# Patient Record
Sex: Female | Born: 1945 | Race: White | Hispanic: No | Smoking: Current every day smoker
Health system: Southern US, Community
[De-identification: ages and names within clinical notes are randomized; demographics above are authoritative.]

## PROBLEM LIST (undated history)

## (undated) DIAGNOSIS — I10 Essential (primary) hypertension: Secondary | ICD-10-CM

## (undated) DIAGNOSIS — D509 Iron deficiency anemia, unspecified: Secondary | ICD-10-CM

## (undated) DIAGNOSIS — R112 Nausea with vomiting, unspecified: Secondary | ICD-10-CM

## (undated) DIAGNOSIS — F410 Panic disorder [episodic paroxysmal anxiety] without agoraphobia: Secondary | ICD-10-CM

## (undated) DIAGNOSIS — F329 Major depressive disorder, single episode, unspecified: Secondary | ICD-10-CM

## (undated) DIAGNOSIS — E039 Hypothyroidism, unspecified: Secondary | ICD-10-CM

## (undated) DIAGNOSIS — E785 Hyperlipidemia, unspecified: Secondary | ICD-10-CM

## (undated) DIAGNOSIS — Q249 Congenital malformation of heart, unspecified: Secondary | ICD-10-CM

## (undated) DIAGNOSIS — F419 Anxiety disorder, unspecified: Secondary | ICD-10-CM

## (undated) DIAGNOSIS — K219 Gastro-esophageal reflux disease without esophagitis: Secondary | ICD-10-CM

## (undated) DIAGNOSIS — C439 Malignant melanoma of skin, unspecified: Secondary | ICD-10-CM

## (undated) DIAGNOSIS — R51 Headache: Secondary | ICD-10-CM

## (undated) DIAGNOSIS — Z9889 Other specified postprocedural states: Secondary | ICD-10-CM

## (undated) DIAGNOSIS — F32A Depression, unspecified: Secondary | ICD-10-CM

## (undated) DIAGNOSIS — K759 Inflammatory liver disease, unspecified: Secondary | ICD-10-CM

## (undated) DIAGNOSIS — I209 Angina pectoris, unspecified: Secondary | ICD-10-CM

## (undated) HISTORY — PX: UPPER GASTROINTESTINAL ENDOSCOPY: SHX188

## (undated) HISTORY — DX: Congenital malformation of heart, unspecified: Q24.9

## (undated) HISTORY — PX: DILATION AND CURETTAGE OF UTERUS: SHX78

## (undated) HISTORY — DX: Hypothyroidism, unspecified: E03.9

## (undated) HISTORY — PX: TUBAL LIGATION: SHX77

## (undated) HISTORY — PX: MELANOMA EXCISION: SHX5266

## (undated) HISTORY — PX: OTHER SURGICAL HISTORY: SHX169

## (undated) HISTORY — DX: Hyperlipidemia, unspecified: E78.5

## (undated) HISTORY — PX: ABDOMINAL HYSTERECTOMY: SHX81

## (undated) HISTORY — PX: COLONOSCOPY: SHX174

## (undated) HISTORY — DX: Malignant melanoma of skin, unspecified: C43.9

## (undated) HISTORY — DX: Anxiety disorder, unspecified: F41.9

## (undated) HISTORY — DX: Iron deficiency anemia, unspecified: D50.9

## (undated) HISTORY — DX: Inflammatory liver disease, unspecified: K75.9

---

## 2000-12-31 ENCOUNTER — Ambulatory Visit (HOSPITAL_COMMUNITY): Admission: RE | Admit: 2000-12-31 | Discharge: 2000-12-31 | Payer: Self-pay | Admitting: Surgery

## 2000-12-31 ENCOUNTER — Encounter: Payer: Self-pay | Admitting: Surgery

## 2001-01-14 ENCOUNTER — Encounter (INDEPENDENT_AMBULATORY_CARE_PROVIDER_SITE_OTHER): Payer: Self-pay | Admitting: *Deleted

## 2001-01-14 ENCOUNTER — Ambulatory Visit (HOSPITAL_BASED_OUTPATIENT_CLINIC_OR_DEPARTMENT_OTHER): Admission: RE | Admit: 2001-01-14 | Discharge: 2001-01-14 | Payer: Self-pay | Admitting: Surgery

## 2005-03-22 ENCOUNTER — Encounter: Admission: RE | Admit: 2005-03-22 | Discharge: 2005-03-22 | Payer: Self-pay | Admitting: Obstetrics and Gynecology

## 2010-06-25 ENCOUNTER — Encounter: Admission: RE | Admit: 2010-06-25 | Discharge: 2010-06-25 | Payer: Self-pay | Admitting: Family Medicine

## 2010-08-25 ENCOUNTER — Encounter: Payer: Self-pay | Admitting: Family Medicine

## 2010-10-15 ENCOUNTER — Emergency Department (HOSPITAL_COMMUNITY)
Admission: EM | Admit: 2010-10-15 | Discharge: 2010-10-15 | Disposition: A | Discharge status: Home / Self Care | Payer: 59 | Attending: Emergency Medicine | Admitting: Emergency Medicine

## 2010-10-15 DIAGNOSIS — R109 Unspecified abdominal pain: Secondary | ICD-10-CM | POA: Insufficient documentation

## 2010-10-15 DIAGNOSIS — R110 Nausea: Secondary | ICD-10-CM | POA: Insufficient documentation

## 2010-10-15 DIAGNOSIS — N898 Other specified noninflammatory disorders of vagina: Secondary | ICD-10-CM | POA: Insufficient documentation

## 2010-10-15 DIAGNOSIS — F329 Major depressive disorder, single episode, unspecified: Secondary | ICD-10-CM | POA: Insufficient documentation

## 2010-10-15 DIAGNOSIS — R55 Syncope and collapse: Secondary | ICD-10-CM | POA: Insufficient documentation

## 2010-10-15 DIAGNOSIS — I10 Essential (primary) hypertension: Secondary | ICD-10-CM | POA: Insufficient documentation

## 2010-10-15 DIAGNOSIS — R42 Dizziness and giddiness: Secondary | ICD-10-CM | POA: Insufficient documentation

## 2010-10-15 DIAGNOSIS — E039 Hypothyroidism, unspecified: Secondary | ICD-10-CM | POA: Insufficient documentation

## 2010-10-15 DIAGNOSIS — N949 Unspecified condition associated with female genital organs and menstrual cycle: Secondary | ICD-10-CM | POA: Insufficient documentation

## 2010-10-15 DIAGNOSIS — R5381 Other malaise: Secondary | ICD-10-CM | POA: Insufficient documentation

## 2010-10-15 DIAGNOSIS — Z79899 Other long term (current) drug therapy: Secondary | ICD-10-CM | POA: Insufficient documentation

## 2010-10-15 DIAGNOSIS — F3289 Other specified depressive episodes: Secondary | ICD-10-CM | POA: Insufficient documentation

## 2010-10-15 LAB — POCT I-STAT, CHEM 8
BUN: 11 mg/dL (ref 6–23)
Calcium, Ion: 1.05 mmol/L — ABNORMAL LOW (ref 1.12–1.32)
Chloride: 103 mEq/L (ref 96–112)
Creatinine, Ser: 1.2 mg/dL (ref 0.4–1.2)
Glucose, Bld: 102 mg/dL — ABNORMAL HIGH (ref 70–99)
HCT: 37 % (ref 36.0–46.0)
Hemoglobin: 12.6 g/dL (ref 12.0–15.0)
Potassium: 3.6 mEq/L (ref 3.5–5.1)
Sodium: 136 mEq/L (ref 135–145)
TCO2: 22 mmol/L (ref 0–100)

## 2010-12-21 NOTE — Op Note (Signed)
[place]  Patient:    Peggy Smith, Peggy Smith                    MRN: [id number] Proc. Date: 01/14/01 Adm. Date:  [id number] Attending:  Katha Cabal CC:         Colon Flattery, D.O.  Ria Bush Jorja Loa, M.D.   Operative Report  CCS# [id number]  PREOPERATIVE DIAGNOSIS:  POSTOPERATIVE DIAGNOSIS:  OPERATION PERFORMED:  SURGEON:  Matthew B. [name], M.D.  ASSISTANT:  ANESTHESIA:  General by LMA.  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  It was felt to be approximately 0.5 mm in thickness although this could not be ensured by the biopsy.  She was seen in the office and discussion was undertaken regarding wide excision versus  wide excision with sentinel lymph node biopsies.  We did preoperative lymphoscintigraphy and she is brought to the operating room at this time for axillary nodal mapping, possible sentinel node biopsy and wide excision.  DESCRIPTION OF PROCEDURE:  Peggy Smith was taken to room 8 at [facility] and after general by LMA was administered, I then performed mapping of the axilla.  The area on her posterior shoulder on the left side had been injected with nuclear medicine.  I elected not to inject this area with Lymphazurin blue.  There were areas very posterior that had counts that were elevated.  I made a small curvilinear incision along the skin crease fairly high in her axilla and went deep.  This area of activity was deep and it was very posterior.  I followed this down into the area of the latissimus dorsi muscle and dissected gently and palpated as I dissected. There was essentially no activity anteriorly along the axillary vein.  This was really kind of pointing posteriorly toward the area of injection. However, as I got down along the muscle and I did not feel anything and I worked this for quite some time, I felt that at this point I  would not blindly probe into this area medial and within the latissimus dorsi muscle and since nothing was palpable, I elected to irrigate and close this incision.  I did not have any neurovascular structures that I transected and no lymphatics were disrupted that I could see.  The wound was closed with 4-0 Vicryl suture with benzoin and Steri-Strips.  The patient was then rolled into the lateral position and I described an ellipse overlying this area which seemed to be overlying an ellipse that would allow for at least 1 cm margin.  This was done and carried out with the knife making it about a 4 cm x 12 cm in greatest dimension ellipse.  This was carried down through the skin, dermis into the fatty tissue and carried down to the fascia.  Specimen was taken off the fascia with the electrocautery.  It was oriented with a long suture on the medial border and a single short suture on the superior border.  The wound was then closed with 2-0 Vicryl subcutaneously and then with a running subcuticular 4-0 Monocryl.  Benzoin and Steri-Strips on the skin.   The patient seemed to tolerate the procedure well and was taken to the recovery room in satisfactory condition.  She will be given Maxidone to take if needed for pain and will be followed up in the office  in seven to 10 days. DD:  01/14/01  TD:  01/14/01 Job: [id number]

## 2011-01-07 ENCOUNTER — Encounter: Payer: Self-pay | Admitting: Cardiovascular Disease

## 2011-01-11 ENCOUNTER — Encounter: Payer: Self-pay | Admitting: Cardiovascular Disease

## 2011-01-14 ENCOUNTER — Encounter: Payer: Self-pay | Admitting: Cardiovascular Disease

## 2011-01-14 ENCOUNTER — Ambulatory Visit (INDEPENDENT_AMBULATORY_CARE_PROVIDER_SITE_OTHER): Payer: 59 | Admitting: Cardiovascular Disease

## 2011-01-14 DIAGNOSIS — R072 Precordial pain: Secondary | ICD-10-CM | POA: Insufficient documentation

## 2011-01-14 DIAGNOSIS — E785 Hyperlipidemia, unspecified: Secondary | ICD-10-CM

## 2011-01-14 DIAGNOSIS — R0600 Dyspnea, unspecified: Secondary | ICD-10-CM | POA: Insufficient documentation

## 2011-01-14 DIAGNOSIS — Z720 Tobacco use: Secondary | ICD-10-CM | POA: Insufficient documentation

## 2011-01-14 DIAGNOSIS — F17200 Nicotine dependence, unspecified, uncomplicated: Secondary | ICD-10-CM

## 2011-01-14 DIAGNOSIS — R0989 Other specified symptoms and signs involving the circulatory and respiratory systems: Secondary | ICD-10-CM

## 2011-01-14 DIAGNOSIS — I10 Essential (primary) hypertension: Secondary | ICD-10-CM

## 2011-01-14 DIAGNOSIS — R079 Chest pain, unspecified: Secondary | ICD-10-CM

## 2011-01-14 DIAGNOSIS — R0609 Other forms of dyspnea: Secondary | ICD-10-CM | POA: Insufficient documentation

## 2011-01-14 NOTE — Assessment & Plan Note (Signed)
Lipomed profile is quite bad.  Statin would be reasonable.  Will f/U with primary.

## 2011-01-14 NOTE — Assessment & Plan Note (Signed)
Normal exam ECG and CXR. Likley related to anxiety.  Will see EF with stress echo

## 2011-01-14 NOTE — Assessment & Plan Note (Signed)
Atypical with normal ECG  Stress echo

## 2011-01-14 NOTE — Progress Notes (Signed)
64 you referred by [facility].  Two weeks ago had URI and sorethroat after returning form [place].  Has anxiety.  Did not feel weel with fatigue, chest pressure and eventually dyspnea.  Had CXR, labs and ECG at [facility].  No abnormalities found.  Records reviewed.  Feels some better now.  CRF;s include HTN, elevated lipids and smoking.  Fairly sedentary but no exertional SSCP.  Denies cough, fever, sore throat is gone.  No history of DVT, PE.  No history of CHF, valve disease or CAD.  Has been very anxious about heart since seeing primary.  I tried to reassure her as it appears that she had URI and anxiety with no objective findings.    Counseled for less than 10 minutes on smoking cessation.  Did not want Chantix.  Offerred Welbutrin and referral to [facility] smoking cessation.  She is willing to try nicorette gum.  Has been on benicar in past but non compliant.  Taking diuretic now.  Does not want to be on long term statin for elevated lipomed profile.    01/09/11 HDL 38 TC 248 LDL PN 2753 LDL 181 HCT 33 with MCV 75.7  CXR NAD ECG NSR 60 normal ECG  ROS: Denies fever, malais, weight loss, blurry vision, decreased visual acuity, cough, sputum, SOB, hemoptysis, pleuritic pain, palpitaitons, heartburn, abdominal pain, melena, lower extremity edema, claudication, or rash.  All other systems reviewed and negative   General: Affect appropriate Healthy:  appears stated age HEENT: normal Neck supple with no adenopathy JVP normal no bruits no thyromegaly Lungs clear with no wheezing and good diaphragmatic motion Heart:  S1/S2 no murmur,rub, gallop or click PMI normal Abdomen: benighn, BS positve, no tenderness, no AAA no bruit.  No HSM or HJR Distal pulses intact with no bruits No edema Neuro non-focal Skin warm and dry No muscular weakness  Medications Current Outpatient Prescriptions  Medication Sig Dispense Refill  . aspirin 81 MG tablet Take 81 mg by mouth daily.        . calcium carbonate (OS-CAL)  600 MG TABS Take 600 mg by mouth 2 (two) times daily with a meal.        . Cholecalciferol (VITAMIN D-3) 5000 UNITS TABS Take by mouth daily.        . clonazePAM (KLONOPIN) 0.5 MG tablet Take 0.5 mg by mouth as directed.        Marland Kitchen esomeprazole (NEXIUM) 40 MG capsule Take 40 mg by mouth daily before breakfast.        . hydrochlorothiazide 25 MG tablet Take 25 mg by mouth daily.        Marland Kitchen l-methylfolate-b2-b6-b12 (CEREFOLIN) 01-03-49-5 MG TABS Take 1 tablet by mouth daily.        Marland Kitchen levothyroxine (SYNTHROID, LEVOTHROID) 50 MCG tablet Take 50 mcg by mouth daily.        . Magnesium 250 MG TABS Take by mouth daily.        . Multiple Vitamin (MULTIVITAMIN) tablet Take 1 tablet by mouth daily.        . NON FORMULARY cholorotrimiton 4mg  qd        . norethindrone (AYGESTIN) 5 MG tablet Take 5 mg by mouth at bedtime.        . vitamin C (ASCORBIC ACID) 500 MG tablet Take 500 mg by mouth daily.          Allergies Review of patient's allergies indicates not on file. Sulfa and Macrodantin  Family History: No family history on file. No  premature CAD  Social History: History   Social History  . Marital Status: Married    Spouse Name: N/A    Number of Children: N/A  . Years of Education: N/A   Occupational History  . Not on file.   Social History Main Topics  . Smoking status: Current Some Day Smoker  . Smokeless tobacco: Not on file  . Alcohol Use: Not on file  . Drug Use: Not on file  . Sexually Active: Not on file   Other Topics Concern  . Not on file   Social History Narrative  . No narrative on file  Married, Sedentary Smokes less than a ppd.  Works as a IT consultant.   Significant anxiety disorder  Electrocardiogram:  NSR 74 Normal ECG  Assessment and Plan

## 2011-01-14 NOTE — Patient Instructions (Signed)
Your physician has requested that you have a stress echocardiogram. For further information please visit [email or web address]. Please follow instruction sheet as given.   

## 2011-01-14 NOTE — Assessment & Plan Note (Signed)
Counseled for less than 10 minutes.  Nicorette gum  F/U primary

## 2011-01-14 NOTE — Assessment & Plan Note (Signed)
Continue diuretic.  Previously on Benicar and told her this is a good medicine to take it if prescribed again.

## 2011-01-28 ENCOUNTER — Ambulatory Visit (HOSPITAL_COMMUNITY): Payer: Medicare PPO | Attending: Cardiovascular Disease

## 2011-01-28 ENCOUNTER — Ambulatory Visit (HOSPITAL_BASED_OUTPATIENT_CLINIC_OR_DEPARTMENT_OTHER): Payer: Medicare PPO | Admitting: Radiology

## 2011-01-28 DIAGNOSIS — I10 Essential (primary) hypertension: Secondary | ICD-10-CM | POA: Insufficient documentation

## 2011-01-28 DIAGNOSIS — R072 Precordial pain: Secondary | ICD-10-CM | POA: Insufficient documentation

## 2011-01-28 DIAGNOSIS — R0989 Other specified symptoms and signs involving the circulatory and respiratory systems: Secondary | ICD-10-CM | POA: Insufficient documentation

## 2011-01-28 DIAGNOSIS — R0609 Other forms of dyspnea: Secondary | ICD-10-CM | POA: Insufficient documentation

## 2011-02-13 ENCOUNTER — Other Ambulatory Visit: Payer: 59

## 2011-02-13 ENCOUNTER — Ambulatory Visit (INDEPENDENT_AMBULATORY_CARE_PROVIDER_SITE_OTHER): Payer: 59 | Admitting: Gastroenterology

## 2011-02-13 ENCOUNTER — Encounter: Payer: Self-pay | Admitting: Gastroenterology

## 2011-02-13 DIAGNOSIS — D509 Iron deficiency anemia, unspecified: Secondary | ICD-10-CM

## 2011-02-13 DIAGNOSIS — K219 Gastro-esophageal reflux disease without esophagitis: Secondary | ICD-10-CM

## 2011-02-13 MED ORDER — PEG-KCL-NACL-NASULF-NA ASC-C 100 G PO SOLR: 1.0000 | Freq: Once | ORAL | Status: DC

## 2011-02-13 NOTE — Patient Instructions (Signed)
Get your labs drawn today in the basement.  You have been scheduled for a Colonoscopy/ Upper Endoscopy with propofol. Separate instructions given. Pick up your prep from the pharmacy.  cc: Leodis Sias, MD

## 2011-02-13 NOTE — Progress Notes (Signed)
History of Present Illness: This is a 65 year old female was found to have an iron deficiency anemia: hemoglobin=11.3, MCV=75.7, ferritin=6, iron=31, B12 & folate normal. She states she restarted menstrual bleeding for the past several months. She has had dark stools since beginning iron.  She was diagnosed with GERD several months ago and placed on Nexium with good control of her symptoms. She relates intermittent tightness in her epigastrium with intermittent loss of appetite. She attributes the symptoms to anxiety. She has not previously undergone colonoscopy or upper endoscopy. She denies weight loss, nausea, vomiting, dysphagia, chest pain, abdominal pain, change in bowel habits, melena, hematochezia.  Past Medical History  Diagnosis Date  . Iron deficiency anemia   . Hyperlipidemia   . Hypothyroidism   . Anxiety   . Cardiac arrhythmia due to congenital heart disease   . Melanoma   . Hepatitis      1976   Past Surgical History  Procedure Date  . Melanoma excision     Shoulder  . Tubal ligation     reports that she has been smoking.  She does not have any smokeless tobacco history on file. She reports that she does not drink alcohol or use illicit drugs. family history includes Brain cancer in her father and Dementia in her mother.  There is no history of Colon cancer. Allergies  Allergen Reactions  . Macrodantin   . Sulfa Antibiotics    Outpatient Encounter Prescriptions as of 02/13/2011  Medication Sig Dispense Refill  . aspirin 81 MG tablet Take 81 mg by mouth daily.        . calcium carbonate (OS-CAL) 600 MG TABS Take 600 mg by mouth 2 (two) times daily with a meal.        . Cholecalciferol (VITAMIN D-3) 5000 UNITS TABS Take by mouth daily.        . clonazePAM (KLONOPIN) 0.5 MG tablet Take 0.25 mg by mouth as directed.       Marland Kitchen esomeprazole (NEXIUM) 40 MG capsule Take 40 mg by mouth daily before breakfast.        . hydrochlorothiazide 25 MG tablet Take 25 mg by mouth daily.         Marland Kitchen l-methylfolate-b2-b6-b12 (CEREFOLIN) 01-03-49-5 MG TABS Take 1 tablet by mouth daily.        Marland Kitchen levothyroxine (SYNTHROID, LEVOTHROID) 50 MCG tablet Take 50 mcg by mouth daily.        . Magnesium 250 MG TABS Take by mouth daily.        . Multiple Vitamin (MULTIVITAMIN) tablet Take 1 tablet by mouth daily.        . NON FORMULARY cholorotrimiton 4mg  qd        . norethindrone (AYGESTIN) 5 MG tablet Take 5 mg by mouth at bedtime.        . vitamin C (ASCORBIC ACID) 500 MG tablet Take 500 mg by mouth daily.        . peg 3350 powder (MOVIPREP) 100 G SOLR Take 1 kit (100 g total) by mouth once.  1 kit  0   Review of Systems: Intermittent headaches, anxiety. Pertinent positive and negative review of systems were noted in the above HPI section. All other review of systems were otherwise negative.  Physical Exam: General: Well developed , well nourished, no acute distress Head: Normocephalic and atraumatic Eyes:  sclerae anicteric, EOMI Ears: Normal auditory acuity Mouth: No deformity or lesions Neck: Supple, no masses or thyromegaly Lungs: Clear throughout to auscultation Heart: Regular  rate and rhythm; no murmurs, rubs or bruits Abdomen: Soft, non tender and non distended. No masses, hepatosplenomegaly or hernias noted. Normal Bowel sounds Rectal: Deferred to colonoscopy Musculoskeletal: Symmetrical with no gross deformities  Skin: No lesions on visible extremities Pulses:  Normal pulses noted Extremities: No clubbing, cyanosis, edema or deformities noted Neurological: Alert oriented x 4, grossly nonfocal Cervical Nodes:  No significant cervical adenopathy Inguinal Nodes: No significant inguinal adenopathy Psychological:  Alert and cooperative. Normal mood and affect. Mildly anxious.  Assessment and Recommendations:  1. Iron deficiency anemia. Possibly related to menstrual losses. Rule out gastrointestinal sources of blood loss such as colorectal neoplasms, AVMs, ulcer disease, Cameron  erosions. Schedule colonoscopy and upper endoscopy. The risks, benefits, and alternatives to colonoscopy with possible biopsy and possible polypectomy were discussed with the patient and they consent to proceed. The risks, benefits, and alternatives to endoscopy with possible biopsy and possible dilation were discussed with the patient and they consent to proceed. Patient states she is quite anxious about undergoing the procedures and she is very concerned about inadequate sedation and having any pain. She takes Klonopin on a daily basis. Plan to proceed with propofol sedation and she is agreeable.  2. GERD. Symptoms well-controlled on Nexium 40 mg daily. Continue Nexium. Begin on standard antireflux measures. Schedule upper endoscopy to evaluate for Barrett's.  3. Anxiety.

## 2011-02-14 LAB — CELIAC PANEL 10
Endomysial Screen: NEGATIVE
IgA: 280 mg/dL (ref 69–380)
Tissue Transglut Ab: 38.7 U/mL — ABNORMAL HIGH (ref <20)

## 2011-03-22 ENCOUNTER — Telehealth: Payer: Self-pay | Admitting: Gastroenterology

## 2011-03-22 ENCOUNTER — Encounter: Payer: Self-pay | Admitting: Gastroenterology

## 2011-03-22 NOTE — Telephone Encounter (Signed)
Spoke with patient and answered questions regarding her medications prior to procedures. Pt states she wait till after the procedure to take her medications just in case.

## 2011-03-22 NOTE — Telephone Encounter (Signed)
Error

## 2011-03-28 DIAGNOSIS — K2270 Barrett's esophagus without dysplasia: Secondary | ICD-10-CM

## 2011-03-29 ENCOUNTER — Ambulatory Visit (AMBULATORY_SURGERY_CENTER): Payer: Medicare PPO | Admitting: Gastroenterology

## 2011-03-29 ENCOUNTER — Encounter: Payer: Self-pay | Admitting: Gastroenterology

## 2011-03-29 VITALS — BP 137/95 | HR 72 | Temp 98.4°F | Resp 16 | Ht 70.0 in | Wt 182.0 lb

## 2011-03-29 DIAGNOSIS — D126 Benign neoplasm of colon, unspecified: Secondary | ICD-10-CM

## 2011-03-29 DIAGNOSIS — K2970 Gastritis, unspecified, without bleeding: Secondary | ICD-10-CM

## 2011-03-29 DIAGNOSIS — D509 Iron deficiency anemia, unspecified: Secondary | ICD-10-CM

## 2011-03-29 DIAGNOSIS — K2940 Chronic atrophic gastritis without bleeding: Secondary | ICD-10-CM

## 2011-03-29 DIAGNOSIS — K2990 Gastroduodenitis, unspecified, without bleeding: Secondary | ICD-10-CM

## 2011-03-29 MED ORDER — SODIUM CHLORIDE 0.9 % IV SOLN: 500.0000 mL | INTRAVENOUS | Status: DC

## 2011-03-29 NOTE — Progress Notes (Signed)
No complaints on discharge.  MAW 

## 2011-03-29 NOTE — Patient Instructions (Signed)
See the picture page for your findings from your exam today.  Follow the green and blue discharge instruction sheets the rest of the day.  Please call if any questions or concerns. Please hold aspirin, aspirin containing products and anti-inflammatory medications for 2 weeks, until 04/12/2011.  You may resume your other prior medications today.

## 2011-04-01 ENCOUNTER — Telehealth: Payer: Self-pay

## 2011-04-01 NOTE — Telephone Encounter (Signed)
Follow up Call- Patient questions:  Do you have a fever, pain , or abdominal swelling? no Pain Score  0 *  Have you tolerated food without any problems? yes  Have you been able to return to your normal activities? yes  Do you have any questions about your discharge instructions: Diet   no Medications  no Follow up visit  no  Do you have questions or concerns about your Care? no  Actions: * If pain score is 4 or above: No action needed, pain <4.  Per the pt "I'm doing okay". MAW

## 2011-04-02 ENCOUNTER — Encounter: Payer: Self-pay | Admitting: Gastroenterology

## 2011-04-26 ENCOUNTER — Other Ambulatory Visit: Payer: Self-pay | Admitting: Obstetrics and Gynecology

## 2011-05-15 NOTE — Patient Instructions (Addendum)
   Your procedure is scheduled on: Thursday, Oct. 18, 2012 at 9:15am  Enter through the Main Entrance of [facility] at:  7:45AM Pick up the phone at the desk and dial [phone] and inform us of your arrival.  Please call this number if you have any problems the morning of surgery: [phone]  Remember: Do not eat food after midnight:  Wednesday Do not drink clear liquids after: Wednesday Take these medicines the morning of surgery with a SIP OF WATER:  PER ANESTHESIA INSTRUCTIONS  Do not wear jewelry, make-up, or FINGER nail polish Do not wear lotions, powders, or perfumes.  You may wear deodorant. Do not shave 48 hours prior to surgery. Do not bring valuables to the hospital.  Patients discharged on the day of surgery will not be allowed to drive home.   Name and phone number of your driver:  Pilar Plate [phone]  Remember to use your hibiclens as instructed.Please shower with 1/2 bottle the evening before your surgery and the other 1/2 bottle the morning of surgery.

## 2011-05-16 NOTE — H&P (Addendum)
NAME:  Peggy Smith, Peggy Smith NO.:  [id number]  MEDICAL RECORD NO.:  [id number]  LOCATION:                                 FACILITY:  PHYSICIAN:  Osborn Coho, M.D.   DATE OF BIRTH:  11/12/1945  DATE OF ADMISSION:  05/23/2011 DATE OF DISCHARGE:                             HISTORY & PHYSICAL   HISTORY OF PRESENT ILLNESS:  Peggy Smith is a 65 year old married white female, para 4-0-0-4 presenting for hysteroscopy, D and C, with resection of an endometrial mass because of postmenopausal bleeding and an endometrial mass.  In September 2010, the patient had hormone pellets placed for menopausal symptoms and reports that since that time, very randomly, she has had bleeding off and on.  At times, the patient's bleeding may only resemble spotting, but then would progress to being "period like" and on occasion very heavy with clotting.  The patient was started on progesterone with some improvement; however, she continued to have intermittent bleeding.  The patient began to notice that whenever she would lift, did a lot of walking or exercise that she would then develop the bleeding once again.  On occasion, there were cramps; however, she did not require any analgesia.  She goes on to say she did not have any post-coital bleeding, changes in her bowel movements, or urinary tract symptoms.  In August 2012, the patient had an endometrial biopsy performed that showed superficial strips of inactive endometrium with benign tubal metaplasia, negative for atypia or malignancy.  A pelvic ultrasound done in September 2012 showed a uterus measuring 12.4 x 6.93 x 7.71 cm with an endometrial mass (questionable fibroid) measuring 2.03 x 1.47 x 1.65 cm.  Additionally, she was observed to have an anterior intramural fibroid measuring 3.78 x 3.18 x 2.87 cm.  Both of the patient's ovaries appeared normal on that study.  The patient had a CBC that showed her hemoglobin and hematocrit to be  14.6 and 42.8 respectively.  Given the patient's menopausal status and the disruptive and protracted nature of her symptoms, she has consented to proceed with hysteroscopy D and C, with possible resection of her endometrial mass.  OB HISTORY:  Gravida 4, para 4-0-0-4.  The patient had a spontaneous vaginal birth in 39, 15, 65, and 47.  GYN HISTORY:  Menarche 65 years old.  The patient has been menopausal since her late 3s.  She denies any abnormal Pap smears or history of sexually transmitted diseases.  Last normal Pap smear was January 2012.  MEDICAL HISTORY: 1. Thyroid disease. 2. Anxiety disorder. 3. Atypical chest pain (negative cardiac workup with [facility]). 4. Gastroesophageal reflux disease. 5. Anemia. 6. Hypertension.  SURGICAL HISTORY:  In 1980, bilateral tubal ligation, 2002 excision of a melanoma from her left shoulder.  The patient denies any history of blood transfusions.  She does have a history of having severe nausea and vomiting with anesthesia.  FAMILY HISTORY:  Positive for hypertension, dementia, and cancer (glioblastoma).  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  HABITS:  She smokes 1/4 pack of cigarettes per day.  Denies any alcohol or illicit  drug use.  CURRENT MEDICATIONS: 1. Levothyroxine 50 mcg. 2. Hydrochlorothiazide 25 mg. 3. Clonazepam 0.5 mg one-half tablet as needed. 4. Aspirin 81 mg daily. 5. Centrum Silver daily. 6. Calcium 600 mg daily. 7. Vitamin B2 100 mg as needed. 8. Magnesium 250 mg as needed. 9. Chlor-Trimeton 4 mg as needed. 10.Nexium 40 mg daily.  ALLERGIES:  MACRODANTIN, which causes her liver to shut down.  Denies any sensitivities to latex, peanuts, soy, or shellfish.  REVIEW OF SYSTEMS:  The patient wears glasses.  She has intermittent headaches that she manages with medication.  She has occasional night sweats, occasional back pain,  occasional palpitations.  Denies any chest pain, shortness of breath, nausea, vomiting, or diarrhea, vision changes, difficulty swallowing, chronic cough, joint swelling, myalgias, (except for back pain) or arthralgias except as is mentioned in history of present illness, the patient's review of systems is otherwise negative.  PHYSICAL EXAMINATION:  VITAL SIGNS:  Blood pressure 140/90, repeated 5 minutes later was 130/88, pulse was 68, respirations 12, temperature 98.3 degrees Fahrenheit orally, weight 178 pounds, height 5 feet 9 inches tall, body mass index 26.3. NECK:  Supple without masses.  There is no thyromegaly or cervical adenopathy. HEART:  Regular rate and rhythm. LUNGS:  Clear. BACK:  No CVA tenderness. ABDOMEN:  No tenderness, masses, or organomegaly. EXTREMITIES:  No clubbing, cyanosis, or edema. PELVIC:  EG/BUS is normal.  Vagina is atrophic.  Cervix is nontender without lesions.  Uterus appears upper limits of normal size without tenderness.  Adnexa without tenderness or masses.  IMPRESSION: 1. Postmenopausal bleeding. 2. Endometrial mass.  DISPOSITION:  A discussion was held with the patient regarding the indications for her procedures along with their risks, which include, but are not limited to reaction to anesthesia, damage to adjacent organs, infection, excessive bleeding, and endometrial scarring.  The patient was given a copy of the ACOG brochure on hysteroscopy and ACOG brochure on dilatation and curettage.  The patient verbalized understanding of her risks and has consented to proceed with hysteroscopy D and C, with possible resection of an endometrial mass at [facility] on May 23, 2011, at 9:15 a.m.     Shenita Trego J. Adline Peals.   ______________________________ Osborn Coho, M.D.    EJP/MEDQ  D:  05/16/2011  T:  05/16/2011  Job:  [id number]  05/23/11 Agree with above no change in H&P - AYR

## 2011-05-17 ENCOUNTER — Encounter (HOSPITAL_COMMUNITY): Payer: Self-pay

## 2011-05-17 ENCOUNTER — Encounter (HOSPITAL_COMMUNITY)
Admission: RE | Admit: 2011-05-17 | Discharge: 2011-05-17 | Disposition: A | Discharge status: Home / Self Care | Payer: Medicare PPO | Source: Ambulatory Visit | Attending: Obstetrics and Gynecology | Admitting: Obstetrics and Gynecology

## 2011-05-17 HISTORY — DX: Other specified postprocedural states: R11.2

## 2011-05-17 HISTORY — DX: Major depressive disorder, single episode, unspecified: F32.9

## 2011-05-17 HISTORY — DX: Headache: R51

## 2011-05-17 HISTORY — DX: Gastro-esophageal reflux disease without esophagitis: K21.9

## 2011-05-17 HISTORY — DX: Depression, unspecified: F32.A

## 2011-05-17 HISTORY — DX: Other specified postprocedural states: Z98.890

## 2011-05-17 HISTORY — DX: Essential (primary) hypertension: I10

## 2011-05-17 HISTORY — DX: Angina pectoris, unspecified: I20.9

## 2011-05-17 LAB — CBC
HCT: 45.1 % (ref 36.0–46.0)
Hemoglobin: 15.5 g/dL — ABNORMAL HIGH (ref 12.0–15.0)
RDW: 13.7 % (ref 11.5–15.5)
WBC: 9.5 10*3/uL (ref 4.0–10.5)

## 2011-05-17 LAB — BASIC METABOLIC PANEL
BUN: 10 mg/dL (ref 6–23)
Chloride: 95 mEq/L — ABNORMAL LOW (ref 96–112)
GFR calc Af Amer: 72 mL/min — ABNORMAL LOW (ref >90)
Glucose, Bld: 100 mg/dL — ABNORMAL HIGH (ref 70–99)
Potassium: 3.6 mEq/L (ref 3.5–5.1)
Sodium: 134 mEq/L — ABNORMAL LOW (ref 135–145)

## 2011-05-17 NOTE — Pre-Procedure Instructions (Signed)
Reviewed Patient's history with Dr Rodman Pickle.  Ok to see patient DOS.  Pt instructed to take morning meds with sip of water DOS.  (A list of morning meds approved to take on DOS hi-lighted and placed on chart). Copy of EKG on chart.

## 2011-05-23 ENCOUNTER — Encounter (HOSPITAL_COMMUNITY)
Admission: RE | Disposition: A | Discharge status: Home / Self Care | Payer: Self-pay | Source: Ambulatory Visit | Attending: Obstetrics and Gynecology

## 2011-05-23 ENCOUNTER — Encounter (HOSPITAL_COMMUNITY): Payer: Self-pay | Admitting: Anesthesiology

## 2011-05-23 ENCOUNTER — Ambulatory Visit (HOSPITAL_COMMUNITY): Payer: Medicare PPO | Admitting: Anesthesiology

## 2011-05-23 ENCOUNTER — Other Ambulatory Visit: Payer: Self-pay | Admitting: Obstetrics and Gynecology

## 2011-05-23 ENCOUNTER — Ambulatory Visit (HOSPITAL_COMMUNITY)
Admission: RE | Admit: 2011-05-23 | Discharge: 2011-05-23 | Disposition: A | Discharge status: Home / Self Care | Payer: Medicare PPO | Source: Ambulatory Visit | Attending: Obstetrics and Gynecology | Admitting: Obstetrics and Gynecology

## 2011-05-23 ENCOUNTER — Encounter (HOSPITAL_COMMUNITY): Payer: Self-pay | Admitting: *Deleted

## 2011-05-23 DIAGNOSIS — N840 Polyp of corpus uteri: Secondary | ICD-10-CM | POA: Insufficient documentation

## 2011-05-23 DIAGNOSIS — N950 Postmenopausal bleeding: Secondary | ICD-10-CM | POA: Insufficient documentation

## 2011-05-23 DIAGNOSIS — D259 Leiomyoma of uterus, unspecified: Secondary | ICD-10-CM | POA: Insufficient documentation

## 2011-05-23 SURGERY — DILATATION & CURETTAGE/HYSTEROSCOPY WITH RESECTOCOPE
Anesthesia: General | Site: Vagina | Wound class: Clean Contaminated

## 2011-05-23 MED ORDER — MIDAZOLAM HCL 2 MG/2ML IJ SOLN
INTRAMUSCULAR | Status: AC
  Filled 2011-05-23: qty 2

## 2011-05-23 MED ORDER — PROPOFOL 10 MG/ML IV EMUL
INTRAVENOUS | Status: DC | PRN
  Administered 2011-05-23: 30 mg via INTRAVENOUS
  Administered 2011-05-23: 120 mg via INTRAVENOUS

## 2011-05-23 MED ORDER — ONDANSETRON HCL 4 MG/2ML IJ SOLN
INTRAMUSCULAR | Status: DC | PRN
  Administered 2011-05-23: 4 mg via INTRAVENOUS

## 2011-05-23 MED ORDER — GLYCINE 1.5 % IR SOLN
Status: DC | PRN
  Administered 2011-05-23: 3000 mL

## 2011-05-23 MED ORDER — EPHEDRINE 5 MG/ML INJ
INTRAVENOUS | Status: AC
  Filled 2011-05-23: qty 10

## 2011-05-23 MED ORDER — LACTATED RINGERS IV SOLN
INTRAVENOUS | Status: DC
  Administered 2011-05-23: 125 mL/h via INTRAVENOUS
  Administered 2011-05-23 (×2): via INTRAVENOUS

## 2011-05-23 MED ORDER — KETOROLAC TROMETHAMINE 60 MG/2ML IM SOLN
INTRAMUSCULAR | Status: AC
  Filled 2011-05-23: qty 2

## 2011-05-23 MED ORDER — EPHEDRINE SULFATE 50 MG/ML IJ SOLN
INTRAMUSCULAR | Status: DC | PRN
  Administered 2011-05-23: 5 mg via INTRAVENOUS

## 2011-05-23 MED ORDER — FENTANYL CITRATE 0.05 MG/ML IJ SOLN
INTRAMUSCULAR | Status: AC
  Filled 2011-05-23: qty 2

## 2011-05-23 MED ORDER — MIDAZOLAM HCL 5 MG/5ML IJ SOLN
INTRAMUSCULAR | Status: DC | PRN
  Administered 2011-05-23: 0.5 mg via INTRAVENOUS

## 2011-05-23 MED ORDER — SCOPOLAMINE 1 MG/3DAYS TD PT72
MEDICATED_PATCH | TRANSDERMAL | Status: AC
  Administered 2011-05-23: 1.5 mg
  Filled 2011-05-23: qty 1

## 2011-05-23 MED ORDER — FENTANYL CITRATE 0.05 MG/ML IJ SOLN: 25.0000 ug | INTRAMUSCULAR | Status: DC | PRN

## 2011-05-23 MED ORDER — PROPOFOL 10 MG/ML IV EMUL
INTRAVENOUS | Status: AC
  Filled 2011-05-23: qty 20

## 2011-05-23 MED ORDER — IBUPROFEN 600 MG PO TABS: 600.0000 mg | ORAL_TABLET | Freq: Four times a day (QID) | ORAL | Status: AC | PRN

## 2011-05-23 MED ORDER — KETOROLAC TROMETHAMINE 30 MG/ML IJ SOLN
INTRAMUSCULAR | Status: DC | PRN
  Administered 2011-05-23: 30 mg via INTRAVENOUS

## 2011-05-23 MED ORDER — HYDROCODONE-ACETAMINOPHEN 5-500 MG PO TABS: 1.0000 | ORAL_TABLET | Freq: Four times a day (QID) | ORAL | Status: AC | PRN

## 2011-05-23 MED ORDER — LIDOCAINE HCL (CARDIAC) 20 MG/ML IV SOLN
INTRAVENOUS | Status: AC
  Filled 2011-05-23: qty 5

## 2011-05-23 MED ORDER — ONDANSETRON HCL 4 MG/2ML IJ SOLN
INTRAMUSCULAR | Status: AC
  Filled 2011-05-23: qty 2

## 2011-05-23 MED ORDER — FENTANYL CITRATE 0.05 MG/ML IJ SOLN
INTRAMUSCULAR | Status: DC | PRN
  Administered 2011-05-23: 50 ug via INTRAVENOUS

## 2011-05-23 MED ORDER — DEXAMETHASONE SODIUM PHOSPHATE 4 MG/ML IJ SOLN
INTRAMUSCULAR | Status: DC | PRN
  Administered 2011-05-23: 10 mg via INTRAVENOUS

## 2011-05-23 MED ORDER — DEXAMETHASONE SODIUM PHOSPHATE 10 MG/ML IJ SOLN
INTRAMUSCULAR | Status: AC
  Filled 2011-05-23: qty 1

## 2011-05-23 MED ORDER — LIDOCAINE HCL 1 % IJ SOLN
INTRAMUSCULAR | Status: DC | PRN
  Administered 2011-05-23: 10 mL

## 2011-05-23 MED ORDER — LIDOCAINE HCL (CARDIAC) 20 MG/ML IV SOLN
INTRAVENOUS | Status: DC | PRN
  Administered 2011-05-23: 50 mg via INTRAVENOUS

## 2011-05-23 MED ORDER — GLYCOPYRROLATE 0.2 MG/ML IJ SOLN
INTRAMUSCULAR | Status: AC
  Filled 2011-05-23: qty 1

## 2011-05-23 SURGICAL SUPPLY — 16 items
CANISTER SUCTION 2500CC (MISCELLANEOUS) ×2 IMPLANT
CATH ROBINSON RED A/P 16FR (CATHETERS) ×2 IMPLANT
CLOTH BEACON ORANGE TIMEOUT ST (SAFETY) ×2 IMPLANT
CONTAINER PREFILL 10% NBF 60ML (FORM) ×4 IMPLANT
DRAPE UTILITY XL STRL (DRAPES) ×4 IMPLANT
ELECT REM PT RETURN 9FT ADLT (ELECTROSURGICAL) ×2
ELECTRODE REM PT RTRN 9FT ADLT (ELECTROSURGICAL) ×1 IMPLANT
GLOVE BIO SURGEON STRL SZ7.5 (GLOVE) ×4 IMPLANT
GLOVE BIOGEL PI IND STRL 7.5 (GLOVE) ×1 IMPLANT
GLOVE BIOGEL PI INDICATOR 7.5 (GLOVE) ×1
GOWN PREVENTION PLUS LG XLONG (DISPOSABLE) ×2 IMPLANT
GOWN STRL REIN XL XLG (GOWN DISPOSABLE) ×2 IMPLANT
LOOP ANGLED CUTTING 22FR (CUTTING LOOP) IMPLANT
PACK HYSTEROSCOPY LF (CUSTOM PROCEDURE TRAY) ×2 IMPLANT
TOWEL OR 17X24 6PK STRL BLUE (TOWEL DISPOSABLE) ×4 IMPLANT
WATER STERILE IRR 1000ML POUR (IV SOLUTION) ×2 IMPLANT

## 2011-05-23 NOTE — Transfer of Care (Signed)
Immediate Anesthesia Transfer of Care Note  Patient: Peggy Smith  Procedure(s) Performed:  DILATATION & CURETTAGE/HYSTEROSCOPY WITH RESECTOSCOPE  Patient Location: PACU  Anesthesia Type: General  Level of Consciousness: awake, alert  and oriented  Airway & Oxygen Therapy: Patient Spontanous Breathing and Patient connected to nasal cannula oxygen  Post-op Assessment: Report given to PACU RN and Post -op Vital signs reviewed and stable  Post vital signs: Reviewed and stable  Complications: No apparent anesthesia complications

## 2011-05-23 NOTE — Op Note (Addendum)
Preop Diagnosis: Post Menopausal Bleeding, Endometrial Mass   Postop Diagnosis: Post Menopausal Bleeding, Endometrial Mass   Procedure: DILATATION & CURETTAGE/HYSTEROSCOPY WITH RESECTOSCOPE   Anesthesia: General   Anesthesiologist: Dana Allan, MD  Attending: Purcell Nails, MD   Assistant: N/a  Findings: approx 2cm posterior wall uterine mass Uterus sounded to 13cm prior to removal of fibroid and 12cm after removal of fibroid  Pathology: 1.endometrial currettings 2.portions of resected fibroid  Fluids: 1400cc Hysteroscopic Deficit 75cc  UOP: QS via straight cath prior to procedure  EBL: Minimal  Complications: None  Procedure: The patient was taken to the operating room after the risks, benefits and alternatives discussed with the patient. The patient verbalized understanding and consent signed and witnessed. The patient was placed under general anesthesia and prepped and draped in the normal sterile fashion and Time Out performed per protocol.  A bivalve speculum was placed in the patient's vagina and the anterior lip of the cervix was grasped with a single tooth tenaculum. A paracervical block was administered using a total of 10 cc of 1% lidocaine. The uterus sounded to 12-13 cm. The cervix was dilated for passage of the hysteroscope.  The hysteroscope was introduced into the uterine cavity and findings as noted above. Sharp curettage was performed until a gritty texture was noted and currettings sent to pathology. The resectoscope was introduced and portion of fibroid resected without difficulty.  The remainder of fibroid was removed with polyp forceps.  The hysteroscope was reintroduced and no obvious remaining intracavitary lesions were noted.  All instruments were removed. Sponge lap and needle count was correct. The patient tolerated the procedure well and was returned to the recovery room in good condition.

## 2011-05-23 NOTE — Anesthesia Procedure Notes (Signed)
Procedure Name: LMA Insertion Date/Time: 05/23/2011 9:35 AM Performed by: Karleen Dolphin Pre-anesthesia Checklist: Patient identified, Patient being monitored, Timeout performed, Emergency Drugs available and Suction available Patient Re-evaluated:Patient Re-evaluated prior to inductionOxygen Delivery Method: Circle System Utilized Preoxygenation: Pre-oxygenation with 100% oxygen LMA: LMA inserted LMA Size: 4.0 Number of attempts: 1 Placement Confirmation: positive ETCO2 and breath sounds checked- equal and bilateral Tube secured with: Tape Dental Injury: Teeth and Oropharynx as per pre-operative assessment

## 2011-05-23 NOTE — Anesthesia Postprocedure Evaluation (Signed)
Anesthesia Post Note  Patient: Peggy Smith  Procedure(s) Performed:  DILATATION & CURETTAGE/HYSTEROSCOPY WITH RESECTOSCOPE  Anesthesia type: General  Patient location: PACU  Post pain: Pain level controlled  Post assessment: Post-op Vital signs reviewed  Last Vitals:  Filed Vitals:   05/23/11 1115  BP: 120/66  Pulse: 73  Temp:   Resp: 16    Post vital signs: Reviewed  Level of consciousness: sedated  Complications: No apparent anesthesia complications

## 2011-05-23 NOTE — Anesthesia Preprocedure Evaluation (Addendum)
Anesthesia Evaluation  Name, MR# and DOB Patient awake  General Assessment Comment  Reviewed: Allergy & Precautions, H&P , NPO status , Patient's Chart, lab work & pertinent test results, reviewed documented beta blocker date and time   History of Anesthesia Complications (+) PONV  Airway Mallampati: I TM Distance: >3 FB Neck ROM: full    Dental  (+) Teeth Intact   Pulmonary (+) shortness of breath (cardiac work-up negative in June) Current Smoker  Normal CXR from 6/12 clear to auscultation  Pulmonary exam normal       Cardiovascular hypertension, On Medications + angina (cardiac work-up negative in June) regular Normal NSR without ischemia on EKG from 6/12 Negative stress echo 6/12   Neuro/Psych  Headaches, PSYCHIATRIC DISORDERS (anxiety, depression)    GI/Hepatic GERD Medicated(+) Hepatitis - (history of hepatitis of unknown origin 35 years ago (entire family had it), no long term effects)  Endo/Other  Hypothyroidism   Renal/GU negative Renal ROS     Musculoskeletal negative musculoskeletal ROS (+)   Abdominal   Peds  Hematology negative hematology ROS (+)   Anesthesia Other Findings   Reproductive/Obstetrics negative OB ROS                          Anesthesia Physical Anesthesia Plan  ASA: III  Anesthesia Plan: General   Post-op Pain Management:    Induction:   Airway Management Planned: LMA  Additional Equipment:   Intra-op Plan:   Post-operative Plan:   Informed Consent: I have reviewed the patients History and Physical, chart, labs and discussed the procedure including the risks, benefits and alternatives for the proposed anesthesia with the patient or authorized representative who has indicated his/her understanding and acceptance.   Dental Advisory Given  Plan Discussed with: CRNA and Surgeon  Anesthesia Plan Comments:        Anesthesia Quick Evaluation

## 2011-10-07 ENCOUNTER — Encounter: Payer: Self-pay | Admitting: Obstetrics and Gynecology

## 2011-10-07 ENCOUNTER — Encounter (INDEPENDENT_AMBULATORY_CARE_PROVIDER_SITE_OTHER): Payer: Medicare PPO | Admitting: Obstetrics and Gynecology

## 2011-10-07 DIAGNOSIS — N950 Postmenopausal bleeding: Secondary | ICD-10-CM

## 2011-10-07 DIAGNOSIS — E559 Vitamin D deficiency, unspecified: Secondary | ICD-10-CM

## 2011-10-07 DIAGNOSIS — Z1329 Encounter for screening for other suspected endocrine disorder: Secondary | ICD-10-CM

## 2011-10-17 ENCOUNTER — Other Ambulatory Visit: Payer: Self-pay | Admitting: Obstetrics and Gynecology

## 2011-10-21 ENCOUNTER — Encounter (HOSPITAL_COMMUNITY): Payer: Self-pay | Admitting: Pharmacist

## 2011-10-30 ENCOUNTER — Encounter (INDEPENDENT_AMBULATORY_CARE_PROVIDER_SITE_OTHER): Payer: Medicare PPO | Admitting: Obstetrics and Gynecology

## 2011-10-30 DIAGNOSIS — N950 Postmenopausal bleeding: Secondary | ICD-10-CM

## 2011-10-30 DIAGNOSIS — D259 Leiomyoma of uterus, unspecified: Secondary | ICD-10-CM

## 2011-10-31 ENCOUNTER — Encounter (HOSPITAL_COMMUNITY): Payer: Self-pay

## 2011-10-31 ENCOUNTER — Encounter (HOSPITAL_COMMUNITY)
Admission: RE | Admit: 2011-10-31 | Discharge: 2011-10-31 | Disposition: A | Discharge status: Home / Self Care | Payer: Medicare PPO | Source: Ambulatory Visit | Attending: Obstetrics and Gynecology | Admitting: Obstetrics and Gynecology

## 2011-10-31 HISTORY — DX: Panic disorder (episodic paroxysmal anxiety): F41.0

## 2011-10-31 LAB — CBC
HCT: 36.6 % (ref 36.0–46.0)
Hemoglobin: 12.1 g/dL (ref 12.0–15.0)
MCV: 85.7 fL (ref 78.0–100.0)
RDW: 13.3 % (ref 11.5–15.5)
WBC: 8.8 10*3/uL (ref 4.0–10.5)

## 2011-10-31 LAB — SURGICAL PCR SCREEN: Staphylococcus aureus: NEGATIVE

## 2011-10-31 LAB — BASIC METABOLIC PANEL
CO2: 29 mEq/L (ref 19–32)
Chloride: 101 mEq/L (ref 96–112)
Creatinine, Ser: 0.98 mg/dL (ref 0.50–1.10)
GFR calc Af Amer: 68 mL/min — ABNORMAL LOW (ref >90)
Potassium: 4.1 mEq/L (ref 3.5–5.1)

## 2011-10-31 NOTE — Patient Instructions (Addendum)
   Your procedure is scheduled on: Friday, April 5th  Enter through the [employer] of [facility] at: 8am Pick up the phone at the desk and dial [phone] and inform us of your arrival.  Please call this number if you have any problems the morning of surgery: [phone]  Remember: Do not eat food after midnight: Thursday Do not drink clear liquids after: midnight Thursday Take these medicines the morning of surgery with a SIP OF WATER:b/p medicine, omeprazole, lexapro, klonopin Do not wear jewelry, make-up, or FINGER nail polish Do not wear lotions, powders, perfumes or deodorant. Do not shave 48 hours prior to surgery. Do not bring valuables to the hospital. Contacts, dentures or bridgework may not be worn into surgery.  Leave suitcase in the car. After Surgery it may be brought to your room. For patients being admitted to the hospital, checkout time is 11:00am the day of discharge.    Remember to use your hibiclens as instructed.Please shower with 1/2 bottle the evening before your surgery and the other 1/2 bottle the morning of surgery. Neck down avoiding private area.

## 2011-11-04 NOTE — H&P (Signed)
NAME:  Peggy Smith, Peggy Smith NO.:  [id number]  MEDICAL RECORD NO.:  [id number]  LOCATION:  [facility]                           FACILITY:  [facility]  PHYSICIAN:  Osborn Coho, M.D.   DATE OF BIRTH:  11-23-1945  DATE OF ADMISSION:  10/16/2011 DATE OF DISCHARGE:                             HISTORY & PHYSICAL   HISTORY OF PRESENT ILLNESS:  Peggy Smith is a 66 year old married white female, para 4-0-0-4 presenting for hysterectomy because of postmenopausal bleeding and uterine fibroids.  The patient has for the past 20 years, been on hormone replacement therapy, with her most recent being subdermal hormone pellets.  The patient discontinued these pellets in September 2010 and since that time she has had intermittent irregular bleeding that at times has been very heavy.  On occasion, the patient may have to change her pad 7 times daily, may soil her clothes, or simply just wear a panty liner.  Over these years she has been treated with Medroxyprogesterone Acetate, Prometrium, and Aygestin all to no avail.  The patient had a pelvic ultrasound in September 2012 that showed a uterus measuring 12.4 x 6.93 x 7.71 cm with an endometrial mass believed to be a fibroid measuring 2.03 x 1.47 x 1.65 cm and an anterior intramural fibroid measuring 3.78 x 3.18 x 2.87 cm.  Both of the patient's ovaries appeared normal on that study.  The patient subsequently underwent hysteroscopy with D & C in October 2012 with pathology showing proliferative endometrium with fragments of submucosal leiomyoma, but no hyperplasia or carcinoma.  After that procedure, the patient continued to have irregular often heavy bleeding. A repeat endometrial biopsy in March 2013 showed benign late secretory endometrium with marked glanular and stromal breakdown consistent with menstrual endometrium.  There was no hyperplasia, atypia, or malignancy identified.  The patient had a CBC,  TSH and vitamin D  all of which were within  normal limits, though her platelet count was 479.  The patient's FSH was non menopausal at 4.1.  The patient denies any pelvic cramping, vaginitis symptoms, flank pain, urinary tract symptoms, or problems with her bowel movements.  The patient was given both medical and surgical management options for her symptoms.  However, due to the protracted and disruptive nature of her symptoms and previous lack of response to therapies she has decided to proceed with definitive therapy in the form of hysterectomy.  OB HISTORY:  Gravida 4, para 4-0-0-4.  The patient had a spontaneous vaginal birth in 63, 63, 47 and 39.  GYN HISTORY:  Menarche at 66 years old.  The patient's menstrual flow has been as previously described.  She uses bilateral tubal ligation as her method of contraception,  Denies any history of abnormal Pap smears or sexually transmitted diseases.  Her last normal Pap smear was in 2013.  PAST MEDICAL HISTORY:  Melanoma (2003), migraines, gastroesophageal reflux disease, thyroid disease, anxiety disorder, atypical chest pain that was deemed noncardiac by [facility] cardiologist, anemia, and hypertension.  SURGICAL HISTORY:  In 1980 bilateral tubal ligation, 2002 excision of left shoulder melanoma, 2012 hysteroscopy D and C with resection of a submucosal fibroid.  She denies any problems with blood transfusions. Admits to  severe nausea and vomiting with anesthesia.  FAMILY HISTORY:  Brain cancer (glioblastoma), emphysema, hypertension, and dementia.  SOCIAL HISTORY:  The patient is married and she works as a Catering manager.  HABITS:  She smokes 3 cigarettes per day.  Denies any alcohol or illicit drug use.  CURRENT MEDICATIONS:  Amlodipine 2.5 mg daily, clonazepam 0.25 mg in the morning and 0.5 mg in the evening, escitalopram 10 mg daily, aspirin 81 mg daily, Centrum, multivitamin daily, calcium 600 mg daily, vitamin B12 100 mcg daily, magnesium 250 mg daily, Chlor-Trimeton 4 mg  daily, levothyroxine 50 mcg daily, vitamin D3 5000 international units daily, and omeprazole 40 mg daily.  ALLERGIES:  The patient is allergic to Macrodantin stating that it shuts down her liver.  She denies any sensitivities to latex, shellfish, soy or peanuts.  REVIEW OF SYSTEMS:  The patient wears glasses.  She denies any headaches, other than with her migraines or any vision changes.  She does on occasion have chest pain, but denies any shortness of breath  (except with panic attacks). She admits to occasional myalgias, but denies any arthralgias, any skin rashes, nausea, vomiting, diarrhea, constipation, and except as is mentioned in history of present illness, the patient's review of systems is otherwise negative.  PHYSICAL EXAMINATION:  VITAL SIGNS:  Blood pressure 100/68, pulse is 78, respirations 16, temperature 97.6 degrees Fahrenheit orally, and weight 165 pounds.  Height 5 feet, 8-1/2 inches tall, body mass index 25. NECK:  Supple without masses.  There is no thyromegaly or cervical adenopathy. HEART:  Regular rate and rhythm. LUNGS:  Clear. BACK:  No CVA tenderness. ABDOMEN:  No tenderness, guarding, rebound or organomegaly. EXTREMITIES:  No clubbing, cyanosis, or edema. PELVIC:  EGBUS is normal.  Vagina is normal.  Cervix is nontender without lesions.  Uterus appears upper limits of normal size, shape, and consistency, is nontender.  Adnexa no tenderness or masses.  IMPRESSION: 1. Post Menopausal Bleeding. 2. Uterine fibroids.  DISPOSITION:  A discussion was held with the patient regarding indications for her procedure along with its risks, which include, but are not limited to reaction to anesthesia, damage to adjacent organs, infection, excessive bleeding, pelvic prolapse and the possible need for an open abdominal incision.  The patient verbalized understanding of these risks and has consented to proceed with a total laparoscopic hysterectomy with bilateral  salpingo-oophorectomy, with the possibility of a laparoscopically assisted vaginal hysterectomy with the possibility of a total abdominal hysterectomy followed by cystoscopy at [facility] [place] on November 08, 2011 at 9:30 a.m.     Ivadell Gaul J. Lowell Guitar, P.A.-C   ______________________________ Osborn Coho, M.D.    EJP/MEDQ  D:  11/03/2011  T:  11/04/2011  Job:  [id number]  Agree with above. H&P Reviewed.

## 2011-11-07 MED ORDER — DEXTROSE 5 % IV SOLN
1.0000 g | INTRAVENOUS | Status: AC
  Administered 2011-11-08: 1 g via INTRAVENOUS
  Filled 2011-11-07: qty 1

## 2011-11-08 ENCOUNTER — Encounter (HOSPITAL_COMMUNITY): Payer: Self-pay | Admitting: Anesthesiology

## 2011-11-08 ENCOUNTER — Inpatient Hospital Stay (HOSPITAL_COMMUNITY): Payer: Medicare PPO | Admitting: Anesthesiology

## 2011-11-08 ENCOUNTER — Observation Stay (HOSPITAL_COMMUNITY)
Admission: AD | Admit: 2011-11-08 | Discharge: 2011-11-09 | Disposition: A | Discharge status: Home / Self Care | Payer: Medicare PPO | Source: Ambulatory Visit | Attending: Obstetrics and Gynecology | Admitting: Obstetrics and Gynecology

## 2011-11-08 ENCOUNTER — Encounter (HOSPITAL_COMMUNITY)
Admission: AD | Disposition: A | Discharge status: Home / Self Care | Payer: Self-pay | Source: Ambulatory Visit | Attending: Obstetrics and Gynecology

## 2011-11-08 DIAGNOSIS — N8000 Endometriosis of the uterus, unspecified: Secondary | ICD-10-CM | POA: Insufficient documentation

## 2011-11-08 DIAGNOSIS — Z01818 Encounter for other preprocedural examination: Secondary | ICD-10-CM | POA: Insufficient documentation

## 2011-11-08 DIAGNOSIS — N950 Postmenopausal bleeding: Secondary | ICD-10-CM

## 2011-11-08 DIAGNOSIS — Z01812 Encounter for preprocedural laboratory examination: Secondary | ICD-10-CM | POA: Insufficient documentation

## 2011-11-08 HISTORY — PX: CYSTOSCOPY: SHX5120

## 2011-11-08 HISTORY — PX: LAPAROSCOPIC HYSTERECTOMY: SHX1926

## 2011-11-08 LAB — CBC
MCH: 27.6 pg (ref 26.0–34.0)
MCHC: 32.4 g/dL (ref 30.0–36.0)
Platelets: 352 10*3/uL (ref 150–400)
RBC: 3.7 MIL/uL — ABNORMAL LOW (ref 3.87–5.11)

## 2011-11-08 SURGERY — HYSTERECTOMY, TOTAL, LAPAROSCOPIC
Anesthesia: General | Wound class: Clean Contaminated

## 2011-11-08 MED ORDER — SCOPOLAMINE 1 MG/3DAYS TD PT72
1.0000 | MEDICATED_PATCH | Freq: Once | TRANSDERMAL | Status: DC | PRN
  Administered 2011-11-08: 1.5 mg via TRANSDERMAL

## 2011-11-08 MED ORDER — PANTOPRAZOLE SODIUM 40 MG PO TBEC
80.0000 mg | DELAYED_RELEASE_TABLET | Freq: Every day | ORAL | Status: DC
  Filled 2011-11-08 (×2): qty 2

## 2011-11-08 MED ORDER — ONDANSETRON HCL 4 MG/2ML IJ SOLN
4.0000 mg | Freq: Four times a day (QID) | INTRAMUSCULAR | Status: DC | PRN
  Administered 2011-11-08: 4 mg via INTRAVENOUS

## 2011-11-08 MED ORDER — HYDROMORPHONE HCL PF 1 MG/ML IJ SOLN
INTRAMUSCULAR | Status: DC | PRN
Start: 2011-11-08 — End: 2011-11-08
  Administered 2011-11-08 (×4): 0.5 mg via INTRAVENOUS

## 2011-11-08 MED ORDER — DIPHENHYDRAMINE HCL 12.5 MG/5ML PO ELIX: 12.5000 mg | ORAL_SOLUTION | Freq: Four times a day (QID) | ORAL | Status: DC | PRN

## 2011-11-08 MED ORDER — ROCURONIUM BROMIDE 100 MG/10ML IV SOLN
INTRAVENOUS | Status: DC | PRN
  Administered 2011-11-08: 50 mg via INTRAVENOUS

## 2011-11-08 MED ORDER — MIDAZOLAM HCL 5 MG/5ML IJ SOLN
INTRAMUSCULAR | Status: DC | PRN
  Administered 2011-11-08 (×3): .25 mg via INTRAVENOUS
  Administered 2011-11-08: 2 mg via INTRAVENOUS
  Administered 2011-11-08: .25 mg via INTRAVENOUS

## 2011-11-08 MED ORDER — ESCITALOPRAM OXALATE 10 MG PO TABS
10.0000 mg | ORAL_TABLET | Freq: Every morning | ORAL | Status: DC
  Administered 2011-11-09: 10 mg via ORAL
  Filled 2011-11-08 (×2): qty 1

## 2011-11-08 MED ORDER — HYDROMORPHONE 0.3 MG/ML IV SOLN
INTRAVENOUS | Status: AC
  Filled 2011-11-08: qty 25

## 2011-11-08 MED ORDER — LEVOTHYROXINE SODIUM 50 MCG PO TABS
50.0000 ug | ORAL_TABLET | Freq: Every day | ORAL | Status: DC
  Administered 2011-11-08: 50 ug via ORAL
  Filled 2011-11-08 (×2): qty 1

## 2011-11-08 MED ORDER — NALOXONE HCL 0.4 MG/ML IJ SOLN: 0.4000 mg | INTRAMUSCULAR | Status: DC | PRN

## 2011-11-08 MED ORDER — GLYCOPYRROLATE 0.2 MG/ML IJ SOLN
INTRAMUSCULAR | Status: DC | PRN
  Administered 2011-11-08: 0.1 mg via INTRAVENOUS
  Administered 2011-11-08: 0.2 mg via INTRAVENOUS

## 2011-11-08 MED ORDER — BUPIVACAINE HCL (PF) 0.25 % IJ SOLN
INTRAMUSCULAR | Status: DC | PRN
  Administered 2011-11-08: 30 mL

## 2011-11-08 MED ORDER — HYDROMORPHONE HCL 2 MG PO TABS: 1.0000 mg | ORAL_TABLET | ORAL | Status: DC | PRN

## 2011-11-08 MED ORDER — ACETAMINOPHEN 325 MG PO TABS: 325.0000 mg | ORAL_TABLET | ORAL | Status: DC | PRN

## 2011-11-08 MED ORDER — IBUPROFEN 600 MG PO TABS
600.0000 mg | ORAL_TABLET | Freq: Four times a day (QID) | ORAL | Status: DC | PRN
  Administered 2011-11-08 – 2011-11-09 (×2): 600 mg via ORAL
  Filled 2011-11-08 (×2): qty 1

## 2011-11-08 MED ORDER — LACTATED RINGERS IV SOLN
INTRAVENOUS | Status: DC
  Administered 2011-11-08 (×4): via INTRAVENOUS

## 2011-11-08 MED ORDER — EPHEDRINE SULFATE 50 MG/ML IJ SOLN
INTRAMUSCULAR | Status: DC | PRN
  Administered 2011-11-08 (×2): 5 mg via INTRAVENOUS
  Administered 2011-11-08: 10 mg via INTRAVENOUS
  Administered 2011-11-08: 15 mg via INTRAVENOUS
  Administered 2011-11-08: 30 mg via INTRAVENOUS
  Administered 2011-11-08 (×2): 10 mg via INTRAVENOUS

## 2011-11-08 MED ORDER — PROMETHAZINE HCL 25 MG/ML IJ SOLN
6.2500 mg | INTRAMUSCULAR | Status: DC | PRN
  Administered 2011-11-08: 6.25 mg via INTRAVENOUS

## 2011-11-08 MED ORDER — DIPHENHYDRAMINE HCL 50 MG/ML IJ SOLN: 12.5000 mg | Freq: Four times a day (QID) | INTRAMUSCULAR | Status: DC | PRN

## 2011-11-08 MED ORDER — KETOROLAC TROMETHAMINE 30 MG/ML IJ SOLN: 15.0000 mg | Freq: Once | INTRAMUSCULAR | Status: DC | PRN

## 2011-11-08 MED ORDER — MEPERIDINE HCL 25 MG/ML IJ SOLN: 6.2500 mg | INTRAMUSCULAR | Status: DC | PRN

## 2011-11-08 MED ORDER — PROMETHAZINE HCL 25 MG/ML IJ SOLN
INTRAMUSCULAR | Status: AC
  Administered 2011-11-08: 6.25 mg via INTRAVENOUS
  Filled 2011-11-08: qty 1

## 2011-11-08 MED ORDER — FENTANYL CITRATE 0.05 MG/ML IJ SOLN: 25.0000 ug | INTRAMUSCULAR | Status: DC | PRN

## 2011-11-08 MED ORDER — MIDAZOLAM HCL 2 MG/2ML IJ SOLN: 0.5000 mg | Freq: Once | INTRAMUSCULAR | Status: DC | PRN

## 2011-11-08 MED ORDER — SODIUM CHLORIDE 0.9 % IJ SOLN: 9.0000 mL | INTRAMUSCULAR | Status: DC | PRN

## 2011-11-08 MED ORDER — ONDANSETRON HCL 4 MG/2ML IJ SOLN: 4.0000 mg | Freq: Four times a day (QID) | INTRAMUSCULAR | Status: DC | PRN

## 2011-11-08 MED ORDER — ONDANSETRON HCL 4 MG/2ML IJ SOLN
INTRAMUSCULAR | Status: AC
  Administered 2011-11-08: 4 mg via INTRAVENOUS
  Filled 2011-11-08: qty 2

## 2011-11-08 MED ORDER — INDIGOTINDISULFONATE SODIUM 8 MG/ML IJ SOLN
INTRAMUSCULAR | Status: AC
  Filled 2011-11-08: qty 5

## 2011-11-08 MED ORDER — SCOPOLAMINE 1 MG/3DAYS TD PT72
MEDICATED_PATCH | TRANSDERMAL | Status: AC
  Administered 2011-11-08: 1.5 mg via TRANSDERMAL
  Filled 2011-11-08: qty 1

## 2011-11-08 MED ORDER — LIDOCAINE HCL (CARDIAC) 20 MG/ML IV SOLN
INTRAVENOUS | Status: DC | PRN
  Administered 2011-11-08: 60 mg via INTRAVENOUS

## 2011-11-08 MED ORDER — BUPIVACAINE HCL (PF) 0.25 % IJ SOLN
INTRAMUSCULAR | Status: AC
  Filled 2011-11-08: qty 30

## 2011-11-08 MED ORDER — AMLODIPINE BESYLATE 2.5 MG PO TABS
2.5000 mg | ORAL_TABLET | Freq: Every morning | ORAL | Status: DC
  Administered 2011-11-09: 2.5 mg via ORAL
  Filled 2011-11-08 (×2): qty 1

## 2011-11-08 MED ORDER — HYDROMORPHONE 0.3 MG/ML IV SOLN
INTRAVENOUS | Status: DC
  Administered 2011-11-08: 17:00:00 via INTRAVENOUS
  Administered 2011-11-08: 0.749 mg via INTRAVENOUS

## 2011-11-08 MED ORDER — CLONAZEPAM 0.5 MG PO TABS
0.5000 mg | ORAL_TABLET | Freq: Every day | ORAL | Status: DC
  Administered 2011-11-08: 0.5 mg via ORAL

## 2011-11-08 MED ORDER — CLONAZEPAM 0.5 MG PO TABS
0.2500 mg | ORAL_TABLET | Freq: Every day | ORAL | Status: DC
  Administered 2011-11-09: 0.25 mg via ORAL
  Filled 2011-11-08 (×2): qty 1

## 2011-11-08 MED ORDER — DEXAMETHASONE SODIUM PHOSPHATE 10 MG/ML IJ SOLN
INTRAMUSCULAR | Status: DC | PRN
  Administered 2011-11-08: 10 mg via INTRAVENOUS

## 2011-11-08 MED ORDER — INDIGOTINDISULFONATE SODIUM 8 MG/ML IJ SOLN
INTRAMUSCULAR | Status: DC | PRN
  Administered 2011-11-08: 40 mg via INTRAVENOUS

## 2011-11-08 MED ORDER — FENTANYL CITRATE 0.05 MG/ML IJ SOLN
INTRAMUSCULAR | Status: DC | PRN
  Administered 2011-11-08: 50 ug via INTRAVENOUS
  Administered 2011-11-08: 100 ug via INTRAVENOUS
  Administered 2011-11-08 (×2): 50 ug via INTRAVENOUS

## 2011-11-08 MED ORDER — ONDANSETRON HCL 4 MG/2ML IJ SOLN
INTRAMUSCULAR | Status: DC | PRN
  Administered 2011-11-08: 4 mg via INTRAVENOUS

## 2011-11-08 MED ORDER — PROPOFOL 10 MG/ML IV EMUL
INTRAVENOUS | Status: DC | PRN
  Administered 2011-11-08: 150 mg via INTRAVENOUS

## 2011-11-08 SURGICAL SUPPLY — 83 items
BARRIER ADHS 3X4 INTERCEED (GAUZE/BANDAGES/DRESSINGS) IMPLANT
CABLE HIGH FREQUENCY MONO STRZ (ELECTRODE) IMPLANT
CANISTER SUCTION 2500CC (MISCELLANEOUS) ×2 IMPLANT
CATH ROBINSON RED A/P 16FR (CATHETERS) IMPLANT
CHLORAPREP W/TINT 26ML (MISCELLANEOUS) ×2 IMPLANT
CLOTH BEACON ORANGE TIMEOUT ST (SAFETY) ×2 IMPLANT
CONT PATH 16OZ SNAP LID 3702 (MISCELLANEOUS) ×2 IMPLANT
COVER MAYO STAND STRL (DRAPES) ×2 IMPLANT
COVER TABLE BACK 60X90 (DRAPES) ×2 IMPLANT
DECANTER SPIKE VIAL GLASS SM (MISCELLANEOUS) IMPLANT
DERMABOND ADVANCED (GAUZE/BANDAGES/DRESSINGS) ×1
DERMABOND ADVANCED .7 DNX12 (GAUZE/BANDAGES/DRESSINGS) ×1 IMPLANT
DISSECTOR BLUNT TIP ENDO 5MM (MISCELLANEOUS) IMPLANT
DISSECTOR SPONGE CHERRY (GAUZE/BANDAGES/DRESSINGS) ×2 IMPLANT
DRAPE HYSTEROSCOPY (DRAPE) IMPLANT
DRAPE PROXIMA HALF (DRAPES) ×2 IMPLANT
ELECT REM PT RETURN 9FT ADLT (ELECTROSURGICAL) ×2
ELECTRODE REM PT RTRN 9FT ADLT (ELECTROSURGICAL) ×1 IMPLANT
EVACUATOR SMOKE 8.L (FILTER) ×4 IMPLANT
FORCEPS CUTTING 33CM 5MM (CUTTING FORCEPS) ×2 IMPLANT
GAUZE PACKING 2X5 YD STERILE (GAUZE/BANDAGES/DRESSINGS) IMPLANT
GAUZE SPONGE 4X4 16PLY XRAY LF (GAUZE/BANDAGES/DRESSINGS) ×2 IMPLANT
GLOVE BIO SURGEON STRL SZ7.5 (GLOVE) ×4 IMPLANT
GLOVE BIOGEL PI IND STRL 7.0 (GLOVE) ×1 IMPLANT
GLOVE BIOGEL PI IND STRL 7.5 (GLOVE) ×2 IMPLANT
GLOVE BIOGEL PI INDICATOR 7.0 (GLOVE) ×1
GLOVE BIOGEL PI INDICATOR 7.5 (GLOVE) ×2
GLOVE INDICATOR 7.0 STRL GRN (GLOVE) ×2 IMPLANT
GOWN PREVENTION PLUS LG XLONG (DISPOSABLE) ×10 IMPLANT
HEMOSTAT SURGICEL 2X14 (HEMOSTASIS) ×2 IMPLANT
HEMOSTAT SURGICEL 4X8 (HEMOSTASIS) IMPLANT
NEEDLE HYPO 25X1 1.5 SAFETY (NEEDLE) IMPLANT
NEEDLE INSUFFLATION 14GA 120MM (NEEDLE) ×2 IMPLANT
NEEDLE MAYO .5 CIRCLE (NEEDLE) IMPLANT
NS IRRIG 1000ML POUR BTL (IV SOLUTION) ×2 IMPLANT
OCCLUDER COLPOPNEUMO (BALLOONS) ×2 IMPLANT
PACK LAPAROSCOPY BASIN (CUSTOM PROCEDURE TRAY) ×2 IMPLANT
PACK LAVH (CUSTOM PROCEDURE TRAY) ×2 IMPLANT
PACK VAGINAL WOMENS (CUSTOM PROCEDURE TRAY) IMPLANT
PAD OB MATERNITY 4.3X12.25 (PERSONAL CARE ITEMS) ×2 IMPLANT
PROTECTOR NERVE ULNAR (MISCELLANEOUS) ×2 IMPLANT
SCALPEL HARMONIC ACE (MISCELLANEOUS) ×2 IMPLANT
SCISSORS LAP 5X35 DISP (ENDOMECHANICALS) IMPLANT
SET CYSTO W/LG BORE CLAMP LF (SET/KITS/TRAYS/PACK) ×2 IMPLANT
SET IRRIG TUBING LAPAROSCOPIC (IRRIGATION / IRRIGATOR) ×2 IMPLANT
SLEEVE ADV FIXATION 5X100MM (TROCAR) ×4 IMPLANT
SOLUTION ELECTROLUBE (MISCELLANEOUS) IMPLANT
SPONGE LAP 18X18 X RAY DECT (DISPOSABLE) ×6 IMPLANT
STAPLER VISISTAT 35W (STAPLE) IMPLANT
STRIP CLOSURE SKIN 1/4X3 (GAUZE/BANDAGES/DRESSINGS) IMPLANT
STRIP CLOSURE SKIN 1/4X4 (GAUZE/BANDAGES/DRESSINGS) IMPLANT
SUT CHROMIC 2 0 CT 1 (SUTURE) ×2 IMPLANT
SUT CHROMIC 2 0 SH (SUTURE) IMPLANT
SUT CHROMIC 2 0 TIES 18 (SUTURE) IMPLANT
SUT MNCRL AB 3-0 PS2 27 (SUTURE) ×6 IMPLANT
SUT MON AB 3-0 SH 27 (SUTURE)
SUT MON AB 3-0 SH27 (SUTURE) IMPLANT
SUT MON AB 4-0 PS1 27 (SUTURE) ×2 IMPLANT
SUT PDS AB 1 CT1 36 (SUTURE) IMPLANT
SUT PDS AB 1 CTX 36 (SUTURE) IMPLANT
SUT PLAIN 2 0 XLH (SUTURE) ×2 IMPLANT
SUT VIC AB 0 CT1 18XCR BRD8 (SUTURE) ×3 IMPLANT
SUT VIC AB 0 CT1 27 (SUTURE) ×4
SUT VIC AB 0 CT1 27XBRD ANBCTR (SUTURE) ×4 IMPLANT
SUT VIC AB 0 CT1 36 (SUTURE) ×2 IMPLANT
SUT VIC AB 0 CT1 8-18 (SUTURE) ×3
SUT VICRYL 0 TIES 12 18 (SUTURE) ×2 IMPLANT
SUT VICRYL 0 UR6 27IN ABS (SUTURE) ×4 IMPLANT
SYR 50ML LL SCALE MARK (SYRINGE) ×2 IMPLANT
SYR CONTROL 10ML LL (SYRINGE) IMPLANT
SYR TB 1ML LUER SLIP (SYRINGE) ×2 IMPLANT
TIP UTERINE 5.1X6CM LAV DISP (MISCELLANEOUS) IMPLANT
TIP UTERINE 6.7X10CM GRN DISP (MISCELLANEOUS) IMPLANT
TIP UTERINE 6.7X6CM WHT DISP (MISCELLANEOUS) IMPLANT
TIP UTERINE 6.7X8CM BLUE DISP (MISCELLANEOUS) ×2 IMPLANT
TOWEL OR 17X24 6PK STRL BLUE (TOWEL DISPOSABLE) ×4 IMPLANT
TRAY FOLEY CATH 14FR (SET/KITS/TRAYS/PACK) ×2 IMPLANT
TROCAR BALLN 12MMX100 BLUNT (TROCAR) IMPLANT
TROCAR Z-THREAD FIOS 11X100 BL (TROCAR) ×4 IMPLANT
TROCAR Z-THREAD FIOS 5X100MM (TROCAR) ×2 IMPLANT
TUBING FILTER THERMOFLATOR (ELECTROSURGICAL) ×2 IMPLANT
WARMER LAPAROSCOPE (MISCELLANEOUS) ×2 IMPLANT
WATER STERILE IRR 1000ML POUR (IV SOLUTION) ×2 IMPLANT

## 2011-11-08 NOTE — Anesthesia Postprocedure Evaluation (Signed)
Anesthesia Post Note  Patient: Peggy Smith  Procedure(s) Performed: Procedure(s) (LRB): HYSTERECTOMY TOTAL LAPAROSCOPIC (N/A) CYSTOSCOPY (N/A)  Anesthesia type: GA  Patient location: PACU  Post pain: Pain level controlled  Post assessment: Post-op Vital signs reviewed  Last Vitals:  Filed Vitals:   11/08/11 0805  BP: 122/74  Pulse: 67  Temp: 36.6 C  Resp: 18    Post vital signs: Reviewed  Level of consciousness: sedated  Complications: No apparent anesthesia complications

## 2011-11-08 NOTE — Transfer of Care (Signed)
Immediate Anesthesia Transfer of Care Note  Patient: Peggy Smith  Procedure(s) Performed: Procedure(s) (LRB): HYSTERECTOMY TOTAL LAPAROSCOPIC (N/A) CYSTOSCOPY (N/A)  Patient Location: PACU  Anesthesia Type: General  Level of Consciousness: sedated and patient cooperative  Airway & Oxygen Therapy: Patient Spontanous Breathing and Patient connected to nasal cannula oxygen  Post-op Assessment: Report given to PACU RN and Post -op Vital signs reviewed and stable  Post vital signs: Reviewed and stable  Complications: No apparent anesthesia complications

## 2011-11-08 NOTE — Progress Notes (Signed)
Day of Surgery Procedure(s) (LRB): 1.HYSTERECTOMY TOTAL LAPAROSCOPIC 2.BSO 3.CYSTOSCOPY   Subjective: Patient reports incisional pain.    Objective: I have reviewed patient's vital signs. UOP 100cc/2hrs   General: alert and no distress Resp: clear to auscultation bilaterally Cardio: regular rate and rhythm GI: soft, decreased bowel sounds, nondistended, app tender, incisions c/d/i Extremities: extremities normal, atraumatic, no cyanosis or edema, Homans sign is negative, no sign of DVT and scds are on Vaginal Bleeding: none  Assessment: s/p Procedure(s) (LRB): HYSTERECTOMY TOTAL LAPAROSCOPIC (N/A) CYSTOSCOPY (N/A): Recovering appropriately.  Plan: Advance diet Encourage ambulation CBC now SCDs Adequate UOP   LOS: 0 days    Kean Gautreau Y 11/08/2011, 5:56 PM

## 2011-11-08 NOTE — Anesthesia Procedure Notes (Signed)
Procedure Name: Intubation Date/Time: 11/08/2011 9:54 AM Performed by: Graciela Husbands Pre-anesthesia Checklist: Suction available, Emergency Drugs available, Timeout performed, Patient identified and Patient being monitored Patient Re-evaluated:Patient Re-evaluated prior to inductionOxygen Delivery Method: Circle system utilized Preoxygenation: Pre-oxygenation with 100% oxygen Intubation Type: IV induction Ventilation: Mask ventilation without difficulty Laryngoscope Size: Mac and 3 Grade View: Grade I Tube type: Oral Tube size: 7.0 mm Number of attempts: 1 Airway Equipment and Method: Stylet Placement Confirmation: ETT inserted through vocal cords under direct vision,  positive ETCO2 and breath sounds checked- equal and bilateral Secured at: 21 cm Tube secured with: Tape (Both front teeth noted to have small notches pre-induction) Dental Injury: Teeth and Oropharynx as per pre-operative assessment

## 2011-11-08 NOTE — Anesthesia Preprocedure Evaluation (Addendum)
Anesthesia Evaluation  Patient identified by MRN, date of birth, ID band Patient awake    Reviewed: Allergy & Precautions, H&P , Patient's Chart, lab work & pertinent test results, reviewed documented beta blocker date and time   History of Anesthesia Complications (+) PONV  Airway Mallampati: II TM Distance: >3 FB Neck ROM: full    Dental No notable dental hx.    Pulmonary neg pulmonary ROS, shortness of breath,  breath sounds clear to auscultation  Pulmonary exam normal       Cardiovascular Exercise Tolerance: Good hypertension, negative cardio ROS  Rhythm:regular Rate:Normal     Neuro/Psych  Headaches, PSYCHIATRIC DISORDERS Anxiety Depression negative neurological ROS  negative psych ROS   GI/Hepatic negative GI ROS, Neg liver ROS, GERD-  ,(+) Hepatitis -  Endo/Other  negative endocrine ROSHypothyroidism   Renal/GU negative Renal ROS     Musculoskeletal   Abdominal   Peds  Hematology negative hematology ROS (+)   Anesthesia Other Findings PONV (postoperative nausea and vomiting)     Iron deficiency anemia        Hypothyroidism     Anxiety        Cardiac arrhythmia due to congenital heart disease     Melanoma        Hepatitis   1976- resolved  Hypertension        GERD (gastroesophageal reflux disease)     Angina   STRESS TEST NORMAL-ANXIETY    Headache   OTC MEDS Depression        Hyperlipidemia   DOES NOT TAKE MEDS - HAS THEM AT HOME BUT STATES DOES NOT TAKE  Panic attacks  Reproductive/Obstetrics negative OB ROS                          Anesthesia Physical Anesthesia Plan  ASA: III  Anesthesia Plan: General ETT   Post-op Pain Management:    Induction:   Airway Management Planned:   Additional Equipment:   Intra-op Plan:   Post-operative Plan:   Informed Consent: I have reviewed the patients History and Physical, chart, labs and discussed the procedure including the  risks, benefits and alternatives for the proposed anesthesia with the patient or authorized representative who has indicated his/her understanding and acceptance.   Dental Advisory Given  Plan Discussed with: CRNA and Surgeon  Anesthesia Plan Comments:         Anesthesia Quick Evaluation

## 2011-11-08 NOTE — Op Note (Signed)
Preop Diagnosis: Post Menopausal Bleeding,Fibriods   Postop Diagnosis: Post Menopausal Bleeding,Fibriods   Procedure: 1.HYSTERECTOMY TOTAL LAPAROSCOPIC 2.BSO 3.CYSTOSCOPY   Anesthesia: General   Anesthesiologist: Velna Hatchet, MD   Attending: Purcell Nails, MD   Assistant:  Jaymes Graff, MD  Findings: Normal bilateral ovaries and tubes.  Uterus sounded to 10 cm.   Pathology: Uterus, cervix, fallopian tubes and ovaries.  Fluids: 2000 cc  UOP: 300 cc  EBL: 175 cc  Complications: None  Procedure: The patient was taken to the operating room, placed under general anesthesia and prepped and draped in the normal sterile fashion. A Foley catheter was placed. The uterus sounded to 10 cm. A weighted speculum and vaginal retractors were placed in the vagina. Tenaculum was placed on the anterior lip of the cervix.  A size 3.5 cm tip was used and the rumi was placed, tip balloon and occluder insufflated. Attention was then turned to the abdomen. A 10 mm infraumbilical incision was made with the scalpel after 5 cc of 25% percent Marcaine was used for local anesthesia. The subcutaneous tissue was dissected and the fascia was incised with the knife. A purse string stitch was placed in the fascia and Hassan placed into the intra-abdominal cavity and anchored to the suture. Intraabdominal placement was confirmed with the laparoscope.  Two 5 mm trochars were placed in the right and left lower quadrants under direct visualization with the laparoscope.  The harmonic scalpel was used to cauterize and cut the uterine ovarian ligaments bilaterally.   Both round ligaments were cauterized and cut with the harmonic scalpel as well and the bladder flap created with the harmonic scalpel and removed away from the uterus. Both uterine arteries were cauterized and cut with harmonic scalpel.  The harmonic was used to circumscribe the [facility] ring and free the uterus and cervix. The uterus was then pulled into  the vagina. The infundibulopelvic ligaments were then cauterized and cut bilaterally after identifying the ureters and the fallopian tubes and ovaries were excised and sent to pathology.  Both angles were sutured with 1 PDS.  The remainder of the cuff was sutured with 1 PDS using interrupted stitches until the vaginal cuff was closed.  A total of 5 sutures were used.   Irrigation was performed.  There was oozing from venous sinuses on the right which were cauterized and surgicel applied to assure hemostasis.  Gas was allowed to leave the abdomen to check for bleeders and all pedicles were seen to be hemostatic and the patient was given indigo carmine.  Cystoscopy was performed and both ureters were seen to efflux indigo carmine without difficulty. The bladder had full integrity with no suture or laceration visualized.  The vagina was inspected and the cuff was noted to be intact.  Attention was then turned back to the abdomen after removing top pair of gloves. The abdomen was reinsufflated with CO2 gas.   The abdomen and pelvis was copiously irrigated and  hemostasis was noted.  The 10 mm port on the patients left side was closed with 0 Vicryl using the fascial closure device.  All trochars were removed under direct visualization using the laparoscope.  The umbilical fascia was reapproximated using 0 vicryl. The two 10 mm incisions were closed with 3-0 Monocryl via a subcuticular stitch.  All remaining skin incisions were closed with Dermabond and the 10 mm skin incisions were reinforced using Dermabond.  Sponge lap and needle counts were correct.  The patient tolerated the procedure well  and was returned to the PACU in stable condition

## 2011-11-09 LAB — CBC
HCT: 26.4 % — ABNORMAL LOW (ref 36.0–46.0)
Platelets: 326 10*3/uL (ref 150–400)
RBC: 3.12 MIL/uL — ABNORMAL LOW (ref 3.87–5.11)
RDW: 13.4 % (ref 11.5–15.5)
WBC: 15.8 10*3/uL — ABNORMAL HIGH (ref 4.0–10.5)

## 2011-11-09 MED ORDER — HYDROMORPHONE HCL 2 MG PO TABS: 1.0000 mg | ORAL_TABLET | Freq: Four times a day (QID) | ORAL | Status: AC | PRN

## 2011-11-09 MED ORDER — IBUPROFEN 600 MG PO TABS: 600.0000 mg | ORAL_TABLET | Freq: Four times a day (QID) | ORAL | Status: AC | PRN

## 2011-11-09 MED ORDER — HYDROMORPHONE HCL 2 MG PO TABS
1.0000 mg | ORAL_TABLET | ORAL | Status: DC | PRN
  Administered 2011-11-09: 1 mg via ORAL
  Filled 2011-11-09: qty 1

## 2011-11-09 NOTE — Addendum Note (Signed)
Addendum  created 11/09/11 1006 by Lincoln Brigham, CRNA   Modules edited:Notes Section

## 2011-11-09 NOTE — Anesthesia Postprocedure Evaluation (Signed)
  Anesthesia Post-op Note  Patient: Peggy Smith  Procedure(s) Performed: Procedure(s) (LRB): HYSTERECTOMY TOTAL LAPAROSCOPIC (N/A) CYSTOSCOPY (N/A)  Patient Location: [facility] Unit  Anesthesia Type: General  Level of Consciousness: awake, alert  and oriented  Airway and Oxygen Therapy: Patient Spontanous Breathing  Post-op Pain: mild  Post-op Assessment: Patient's Cardiovascular Status Stable, Respiratory Function Stable, Patent Airway, No signs of Nausea or vomiting, Adequate PO intake and Pain level controlled  Post-op Vital Signs: stable  Complications: No apparent anesthesia complications

## 2011-11-09 NOTE — Discharge Summary (Signed)
Physician Discharge Summary  Patient ID: Peggy Smith MRN: [id number] DOB/AGE: 67-Dec-1947 67 y.o.  Admit date: 11/08/2011 Discharge date: 11/09/2011  Admission Diagnoses: PMB  Discharge Diagnoses: PMB  Active Problems:  * No active hospital problems. *    Discharged Condition: good  Hospital Course: Pt is s/p TLH/BSO/Cystoscopy.  She had an uneventful postop course and is ready for discharge on postop day 1 with no complaints.  Ambulating without difficulty and denies lightheadedness or dizziness.  Pt has not taken narcotic pain med because she doesn't want to feel woozy.  She has moderate pain and I discussed pain medicine as needed.  Consults: None  Significant Diagnostic Studies: n/a  Treatments: surgery: TLH/BSO/Cystopscopy and routine post op care.  Discharge Exam: Blood pressure 127/74, pulse 84, temperature 98.9 F (37.2 C), temperature source Oral, resp. rate 18, SpO2 100.00%. General appearance: alert and no distress Resp: clear to auscultation bilaterally Cardio: regular rate and rhythm GI: soft, app tender, ND, NABS, incisions c/d/i Extremities: extremities normal, atraumatic, no cyanosis or edema, Homans sign is negative, no sign of DVT and no edema, redness or tenderness in the calves or thighs  Disposition: 01-Home or Self Care  Discharge Orders    Future Appointments: Provider: Department: Dept Phone: Center:   12/20/2011 2:00 PM Purcell Nails, MD [facility] [phone] None     Medication List  As of 11/09/2011 10:12 AM   TAKE these medications         amLODipine 2.5 MG tablet   Commonly known as: NORVASC   Take 2.5 mg by mouth every morning.      aspirin 81 MG tablet   Take 81 mg by mouth every morning.      calcium carbonate 600 MG Tabs   Commonly known as: OS-CAL   Take 600 mg by mouth 2 (two) times daily with a meal.      CENTRUM SILVER PO   Take 1 tablet by mouth every morning.      clonazePAM 0.5 MG tablet   Commonly known as:  KLONOPIN   Take 0.25-0.5 mg by mouth 2 (two) times daily. Take 0.25mg  in morning and 0.5mg  at night      escitalopram 10 MG tablet   Commonly known as: LEXAPRO   Take 10 mg by mouth every morning.      HYDROmorphone 2 MG tablet   Commonly known as: DILAUDID   Take 0.5 tablets (1 mg total) by mouth every 6 (six) hours as needed for pain.      ibuprofen 600 MG tablet   Commonly known as: ADVIL,MOTRIN   Take 1 tablet (600 mg total) by mouth every 6 (six) hours as needed for pain.      levothyroxine 50 MCG tablet   Commonly known as: SYNTHROID, LEVOTHROID   Take 50 mcg by mouth daily before supper.      Magnesium 250 MG Tabs   Take 250 mg by mouth every morning.      omeprazole 40 MG capsule   Commonly known as: PRILOSEC   Take 40 mg by mouth every morning.      VITAMIN B-2 PO   Take 100 mg by mouth every morning.      Vitamin D-3 5000 UNITS Tabs   Take by mouth every morning.           Follow-up Information    Follow up with Purcell Nails, MD in 6 weeks. (as scheduled)    Contact information:   [address]  [address] [place] [address] [phone]        D/C Instructions discussed.  Questions answered.    SignedPurcell Nails 11/09/2011, 10:12 AM

## 2011-11-11 ENCOUNTER — Encounter (HOSPITAL_COMMUNITY): Payer: Self-pay | Admitting: Obstetrics and Gynecology

## 2011-12-04 NOTE — Progress Notes (Signed)
Post discharge review completed. 

## 2011-12-20 ENCOUNTER — Ambulatory Visit (INDEPENDENT_AMBULATORY_CARE_PROVIDER_SITE_OTHER): Payer: Medicare PPO | Admitting: Obstetrics and Gynecology

## 2011-12-20 ENCOUNTER — Encounter: Payer: Self-pay | Admitting: Obstetrics and Gynecology

## 2011-12-20 VITALS — BP 104/62 | HR 72 | Temp 98.8°F | Resp 16 | Ht 68.0 in | Wt 168.0 lb

## 2011-12-20 DIAGNOSIS — Z789 Other specified health status: Secondary | ICD-10-CM

## 2011-12-20 DIAGNOSIS — Z87898 Personal history of other specified conditions: Secondary | ICD-10-CM

## 2011-12-20 NOTE — Progress Notes (Signed)
DATE OR SURGERY: 11/08/2011 PAIN:No VAG BLEEDING: no VAG DISCHARGE: no NORMAL GI FUNCTN: yes NORMAL GU FUNCTN: yes  No complaints  Filed Vitals:   12/20/11 1419  BP: 104/62  Pulse: 72  Temp: 98.8 F (37.1 C)  Resp: 16   ROS: noncontributory  Pelvic exam:  VULVA: normal appearing vulva with no masses, tenderness or lesions,  VAGINA: normal appearing vagina with normal color and discharge, no lesions, ADNEXA: normal adnexa in size, nontender and no masses.  Path - benign findings, adenomyosis, normal bilateral ovaries and fallopian tubes  Assessment and plan Return to office in one year for annual exam

## 2011-12-20 NOTE — Progress Notes (Deleted)
Subjective:     Patient ID: Peggy Smith, female   DOB: [birth year], 66 y.o.   MRN: [id number]  HPI   Review of Systems     Objective:   Physical Exam     Assessment:     ***    Plan:     ***

## 2012-10-16 ENCOUNTER — Other Ambulatory Visit: Payer: Self-pay | Admitting: Family Medicine

## 2012-10-16 LAB — COMPLETE METABOLIC PANEL WITH GFR
ALT: 12 U/L (ref 0–35)
AST: 16 U/L (ref 0–37)
Albumin: 4.3 g/dL (ref 3.5–5.2)
Alkaline Phosphatase: 71 U/L (ref 39–117)
BUN: 15 mg/dL (ref 6–23)
CO2: 28 mEq/L (ref 19–32)
Calcium: 9.6 mg/dL (ref 8.4–10.5)
Chloride: 102 mEq/L (ref 96–112)
Creat: 0.98 mg/dL (ref 0.50–1.10)
GFR, Est African American: 69 mL/min
GFR, Est Non African American: 60 mL/min
Glucose, Bld: 99 mg/dL (ref 70–99)
Potassium: 4.2 mEq/L (ref 3.5–5.3)
Sodium: 137 mEq/L (ref 135–145)
Total Bilirubin: 0.4 mg/dL (ref 0.3–1.2)
Total Protein: 7.2 g/dL (ref 6.0–8.3)

## 2012-10-16 LAB — IRON AND TIBC
%SAT: 22 % (ref 20–55)
Iron: 80 ug/dL (ref 42–145)
TIBC: 372 ug/dL (ref 250–470)
UIBC: 292 ug/dL (ref 125–400)

## 2012-10-17 LAB — TSH: TSH: 8.585 u[IU]/mL — ABNORMAL HIGH (ref 0.350–4.500)

## 2012-10-17 LAB — FERRITIN: Ferritin: 23 ng/mL (ref 10–291)

## 2012-10-20 ENCOUNTER — Other Ambulatory Visit: Payer: Self-pay | Admitting: *Deleted

## 2012-10-20 LAB — NMR LIPOPROFILE WITH LIPIDS
Cholesterol, Total: 140 mg/dL (ref <200)
HDL Particle Number: 36.3 umol/L (ref >=30.5)
HDL Size: 9.2 nm (ref >=9.2)
HDL-C: 56 mg/dL (ref >=40)
LDL (calc): 57 mg/dL (ref <100)
LDL Particle Number: 1053 nmol/L — ABNORMAL HIGH (ref <1000)
LDL Size: 20.4 nm — ABNORMAL LOW (ref >20.5)
LP-IR Score: 61 — ABNORMAL HIGH (ref <=45)
Large HDL-P: 6.4 umol/L (ref >=4.8)
Large VLDL-P: 6.2 nmol/L — ABNORMAL HIGH (ref <=2.7)
Small LDL Particle Number: 637 nmol/L — ABNORMAL HIGH (ref <=527)
Triglycerides: 133 mg/dL (ref <150)
VLDL Size: 53.8 nm — ABNORMAL HIGH (ref >=46.6)

## 2012-10-20 NOTE — Telephone Encounter (Signed)
Opened in error

## 2012-10-21 ENCOUNTER — Other Ambulatory Visit: Payer: Self-pay

## 2012-10-21 MED ORDER — LEVOTHYROXINE SODIUM 75 MCG PO TABS: 75.0000 ug | ORAL_TABLET | Freq: Every day | ORAL | Status: DC

## 2012-11-23 ENCOUNTER — Encounter: Payer: Self-pay | Admitting: *Deleted

## 2012-11-23 ENCOUNTER — Other Ambulatory Visit: Payer: Self-pay | Admitting: *Deleted

## 2012-11-23 NOTE — Telephone Encounter (Signed)
LAST REFILL 10/15/12. LAST OV 10/15/12. PLEASE CALL IN  TO [facility] [place]. ROUTE TO YOUR NURSE. THANKS.

## 2012-11-24 MED ORDER — CLONAZEPAM 0.5 MG PO TABS: ORAL_TABLET | ORAL | Status: DC

## 2012-11-24 NOTE — Telephone Encounter (Signed)
Okay to refill 30 daysprescription.

## 2012-12-03 ENCOUNTER — Other Ambulatory Visit: Payer: Medicare HMO

## 2012-12-03 DIAGNOSIS — E039 Hypothyroidism, unspecified: Secondary | ICD-10-CM

## 2012-12-04 ENCOUNTER — Ambulatory Visit (INDEPENDENT_AMBULATORY_CARE_PROVIDER_SITE_OTHER): Payer: Medicare HMO | Admitting: Nurse Practitioner

## 2012-12-04 VITALS — BP 123/74 | HR 112 | Temp 97.4°F | Resp 20 | Ht 68.5 in | Wt 192.0 lb

## 2012-12-04 DIAGNOSIS — R079 Chest pain, unspecified: Secondary | ICD-10-CM

## 2012-12-04 DIAGNOSIS — E039 Hypothyroidism, unspecified: Secondary | ICD-10-CM

## 2012-12-04 LAB — THYROID PANEL WITH TSH
Free Thyroxine Index: 3.1 (ref 1.0–3.9)
T3 Uptake: 31.7 % (ref 22.5–37.0)
T4, Total: 9.9 ug/dL (ref 5.0–12.5)
TSH: 2.701 u[IU]/mL (ref 0.350–4.500)
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
## 2012-12-04 NOTE — Patient Instructions (Signed)

## 2012-12-09 ENCOUNTER — Other Ambulatory Visit: Payer: Self-pay | Admitting: Family Medicine

## 2013-01-14 ENCOUNTER — Other Ambulatory Visit: Payer: Self-pay | Admitting: Family Medicine

## 2013-01-19 ENCOUNTER — Encounter: Payer: Self-pay | Admitting: Family Medicine

## 2013-01-19 ENCOUNTER — Ambulatory Visit (INDEPENDENT_AMBULATORY_CARE_PROVIDER_SITE_OTHER): Payer: Medicare HMO | Admitting: Family Medicine

## 2013-01-19 VITALS — BP 122/72 | HR 89 | Temp 97.4°F | Wt 193.6 lb

## 2013-01-19 DIAGNOSIS — F410 Panic disorder [episodic paroxysmal anxiety] without agoraphobia: Secondary | ICD-10-CM

## 2013-01-19 DIAGNOSIS — E039 Hypothyroidism, unspecified: Secondary | ICD-10-CM

## 2013-01-19 DIAGNOSIS — E785 Hyperlipidemia, unspecified: Secondary | ICD-10-CM

## 2013-01-19 DIAGNOSIS — F17200 Nicotine dependence, unspecified, uncomplicated: Secondary | ICD-10-CM

## 2013-01-19 DIAGNOSIS — F411 Generalized anxiety disorder: Secondary | ICD-10-CM

## 2013-01-19 DIAGNOSIS — E782 Mixed hyperlipidemia: Secondary | ICD-10-CM | POA: Insufficient documentation

## 2013-01-19 DIAGNOSIS — I10 Essential (primary) hypertension: Secondary | ICD-10-CM

## 2013-01-19 MED ORDER — ESCITALOPRAM OXALATE 20 MG PO TABS: 10.0000 mg | ORAL_TABLET | Freq: Every morning | ORAL | Status: DC

## 2013-01-19 NOTE — Patient Instructions (Signed)
    Dr Sundus Pete's Recommendations  Diet and Exercise discussed with patient.  For nutrition information, I recommend books:  1).Eat to Live by Dr Joel Fuhrman. 2).Prevent and Reverse Heart Disease by Dr Caldwell Esselstyn. 3) Dr Neal Barnard's Book:  Program to Reverse Diabetes  Exercise recommendations are:  If unable to walk, then the patient can exercise in a chair 3 times a day. By flapping arms like a bird gently and raising legs outwards to the front.  If ambulatory, the patient can go for walks for 30 minutes 3 times a week. Then increase the intensity and duration as tolerated.  Goal is to try to attain exercise frequency to 5 times a week.  If applicable: Best to perform resistance exercises (machines or weights) 2 days a week and cardio type exercises 3 days per week.   Smoking Cessation Quitting smoking is important to your health and has many advantages. However, it is not always easy to quit since nicotine is a very addictive drug. Often times, people try 3 times or more before being able to quit. This document explains the best ways for you to prepare to quit smoking. Quitting takes hard work and a lot of effort, but you can do it. ADVANTAGES OF QUITTING SMOKING  You will live longer, feel better, and live better.  Your body will feel the impact of quitting smoking almost immediately.  Within 20 minutes, blood pressure decreases. Your pulse returns to its normal level.  After 8 hours, carbon monoxide levels in the blood return to normal. Your oxygen level increases.  After 24 hours, the chance of having a heart attack starts to decrease. Your breath, hair, and body stop smelling like smoke.  After 48 hours, damaged nerve endings begin to recover. Your sense of taste and smell improve.  After 72 hours, the body is virtually free of nicotine. Your bronchial tubes relax and breathing becomes easier.  After 2 to 12 weeks, lungs can hold more air. Exercise becomes  easier and circulation improves.  The risk of having a heart attack, stroke, cancer, or lung disease is greatly reduced.  After 1 year, the risk of coronary heart disease is cut in half.  After 5 years, the risk of stroke falls to the same as a nonsmoker.  After 10 years, the risk of lung cancer is cut in half and the risk of other cancers decreases significantly.  After 15 years, the risk of coronary heart disease drops, usually to the level of a nonsmoker.  If you are pregnant, quitting smoking will improve your chances of having a healthy baby.  The people you live with, especially any children, will be healthier.  You will have extra money to spend on things other than cigarettes. QUESTIONS TO THINK ABOUT BEFORE ATTEMPTING TO QUIT You may want to talk about your answers with your caregiver.  Why do you want to quit?  If you tried to quit in the past, what helped and what did not?  What will be the most difficult situations for you after you quit? How will you plan to handle them?  Who can help you through the tough times? Your family? Friends? A caregiver?  What pleasures do you get from smoking? What ways can you still get pleasure if you quit? Here are some questions to ask your caregiver:  How can you help me to be successful at quitting?  What medicine do you think would be best for me and how should   I take it?  What should I do if I need more help?  What is smoking withdrawal like? How can I get information on withdrawal? GET READY  Set a quit date.  Change your environment by getting rid of all cigarettes, ashtrays, matches, and lighters in your home, car, or work. Do not let people smoke in your home.  Review your past attempts to quit. Think about what worked and what did not. GET SUPPORT AND ENCOURAGEMENT You have a better chance of being successful if you have help. You can get support in many ways.  Tell your family, friends, and co-workers that you are  going to quit and need their support. Ask them not to smoke around you.  Get individual, group, or telephone counseling and support. Programs are available at local hospitals and health centers. Call your local health department for information about programs in your area.  Spiritual beliefs and practices may help some smokers quit.  Download a "quit meter" on your computer to keep track of quit statistics, such as how long you have gone without smoking, cigarettes not smoked, and money saved.  Get a self-help book about quitting smoking and staying off of tobacco. LEARN NEW SKILLS AND BEHAVIORS  Distract yourself from urges to smoke. Talk to someone, go for a walk, or occupy your time with a task.  Change your normal routine. Take a different route to work. Drink tea instead of coffee. Eat breakfast in a different place.  Reduce your stress. Take a hot bath, exercise, or read a book.  Plan something enjoyable to do every day. Reward yourself for not smoking.  Explore interactive web-based programs that specialize in helping you quit. GET MEDICINE AND USE IT CORRECTLY Medicines can help you stop smoking and decrease the urge to smoke. Combining medicine with the above behavioral methods and support can greatly increase your chances of successfully quitting smoking.  Nicotine replacement therapy helps deliver nicotine to your body without the negative effects and risks of smoking. Nicotine replacement therapy includes nicotine gum, lozenges, inhalers, nasal sprays, and skin patches. Some may be available over-the-counter and others require a prescription.  Antidepressant medicine helps people abstain from smoking, but how this works is unknown. This medicine is available by prescription.  Nicotinic receptor partial agonist medicine simulates the effect of nicotine in your brain. This medicine is available by prescription. Ask your caregiver for advice about which medicines to use and how  to use them based on your health history. Your caregiver will tell you what side effects to look out for if you choose to be on a medicine or therapy. Carefully read the information on the package. Do not use any other product containing nicotine while using a nicotine replacement product.  RELAPSE OR DIFFICULT SITUATIONS Most relapses occur within the first 3 months after quitting. Do not be discouraged if you start smoking again. Remember, most people try several times before finally quitting. You may have symptoms of withdrawal because your body is used to nicotine. You may crave cigarettes, be irritable, feel very hungry, cough often, get headaches, or have difficulty concentrating. The withdrawal symptoms are only temporary. They are strongest when you first quit, but they will go away within 10 14 days. To reduce the chances of relapse, try to:  Avoid drinking alcohol. Drinking lowers your chances of successfully quitting.  Reduce the amount of caffeine you consume. Once you quit smoking, the amount of caffeine in your body increases and can give you   symptoms, such as a rapid heartbeat, sweating, and anxiety.  Avoid smokers because they can make you want to smoke.  Do not let weight gain distract you. Many smokers will gain weight when they quit, usually less than 10 pounds. Eat a healthy diet and stay active. You can always lose the weight gained after you quit.  Find ways to improve your mood other than smoking. FOR MORE INFORMATION  www.smokefree.gov  Document Released: 07/16/2001 Document Revised: 01/21/2012 Document Reviewed: 10/31/2011 Exit[employer] Patient Information 2014 [employer].  

## 2013-01-19 NOTE — Progress Notes (Signed)
Patient ID: Peggy Smith, female   DOB: Jun 03, 1946, 67 y.o.   MRN: [id number] SUBJECTIVE: CC: Chief Complaint  Patient presents with  . Follow-up    3 month follow up     HPI: Patient is here for follow up of hypertension/hypothyroidism/tobacco use.: Doing well, no new complaints  Except for panic attacks. Had a couple recently.  denies Headache;deniesChest Pain;denies weakness;denies Shortness of Breath or Orthopnea;denies Visual changes;denies palpitations;denies cough;denies pedal edema;denies symptoms of TIA or stroke; admits to Compliance with medications. denies Problems with medications.   PMH/PSH: reviewed/updated in Epic  SH/FH: reviewed/updated in Epic  Allergies: reviewed/updated in Epic  Medications: reviewed/updated in Epic  Immunizations: reviewed/updated in Epic  ROS: As above in the HPI. All other systems are stable or negative.  OBJECTIVE: APPEARANCE:  Patient in no acute distress.The patient appeared well nourished and normally developed. Acyanotic. Waist: VITAL SIGNS:BP 122/72  Pulse 89  Temp(Src) 97.4 F (36.3 C) (Oral)  Wt 193 lb 9.6 oz (87.816 kg)  BMI 29.01 kg/m2  WF Overweight  SKIN: warm and  Dry without overt rashes, tattoos and scars  HEAD and Neck: without JVD, Head and scalp: normal Eyes:No scleral icterus. Fundi normal, eye movements normal. Ears: Auricle normal, canal normal, Tympanic membranes normal, insufflation normal. Nose: normal Throat: normal Neck & thyroid: normal  CHEST & LUNGS: Chest wall: normal Lungs: Clear  [facility]: Reveals the PMI to be normally located. Regular rhythm, First and Second Heart sounds are normal,  absence of murmurs, rubs or gallops. Peripheral vasculature: Radial pulses: normal Dorsal pedis pulses: normal Posterior pulses: normal  ABDOMEN:  Appearance: normal Benign, no organomegaly, no masses, no Abdominal Aortic enlargement. No Guarding , no rebound. No Bruits. Bowel sounds:  normal  RECTAL: N/A GU: N/A  EXTREMETIES: nonedematous. Both Femoral and Pedal pulses are normal.  MUSCULOSKELETAL:  Spine: normal Joints: intact  NEUROLOGIC: oriented to time,place and person; nonfocal. Strength is normal Sensory is normal Reflexes are normal Cranial Nerves are normal.  ASSESSMENT:  HLD (hyperlipidemia) - Plan: NMR Lipoprofile with Lipids  HTN (hypertension)  Smoking  Panic attacks - Plan: escitalopram (LEXAPRO) 20 MG tablet  Anxiety state, unspecified - Plan: escitalopram (LEXAPRO) 20 MG tablet  Unspecified hypothyroidism  PLAN: Orders Placed This Encounter  Procedures  . NMR Lipoprofile with Lipids   Results for orders placed in visit on 12/03/12  THYROID PANEL WITH TSH      Result Value Range   T4, Total 9.9  5.0 - 12.5 ug/dL   T3 Uptake 40.9  81.1 - 37.0 %   Free Thyroxine Index 3.1  1.0 - 3.9   TSH 2.701  0.350 - 4.500 uIU/mL   Meds ordered this encounter  Medications  . escitalopram (LEXAPRO) 20 MG tablet    Sig: Take 0.5 tablets (10 mg total) by mouth every morning.    Dispense:  30 tablet    Refill:  5         Dr Woodroe Mode Recommendations  Diet and Exercise discussed with patient.  For nutrition information, I recommend books:  1).Eat to Live by Dr Monico Hoar. 2).Prevent and Reverse Heart Disease by Dr Suzzette Righter. 3) Dr Katherina Right Book:  Program to Reverse Diabetes  Exercise recommendations are:  If unable to walk, then the patient can exercise in a chair 3 times a day. By flapping arms like a bird gently and raising legs outwards to the front.  If ambulatory, the patient can go for walks for 30 minutes 3 times  a week. Then increase the intensity and duration as tolerated.  Goal is to try to attain exercise frequency to 5 times a week.  If applicable: Best to perform resistance exercises (machines or weights) 2 days a week and cardio type exercises 3 days per week.  An After Visit Summary was printed  and given to the patient. Including smoking cessation handout. counselled smoking cessation  Return in about 3 months (around 04/21/2013) for Recheck medical problems.  Shinika Estelle P. [place] Charon, M.D.

## 2013-01-20 ENCOUNTER — Ambulatory Visit: Payer: Medicare HMO | Admitting: Family Medicine

## 2013-01-20 ENCOUNTER — Other Ambulatory Visit: Payer: Self-pay | Admitting: Family Medicine

## 2013-01-20 DIAGNOSIS — E785 Hyperlipidemia, unspecified: Secondary | ICD-10-CM

## 2013-01-20 LAB — NMR LIPOPROFILE WITH LIPIDS
Cholesterol, Total: 255 mg/dL — ABNORMAL HIGH (ref <200)
HDL Particle Number: 28.8 umol/L — ABNORMAL LOW (ref >=30.5)
HDL Size: 8.7 nm — ABNORMAL LOW (ref >=9.2)
HDL-C: 43 mg/dL (ref >=40)
LDL (calc): 153 mg/dL — ABNORMAL HIGH (ref <100)
LDL Particle Number: 2982 nmol/L — ABNORMAL HIGH (ref <1000)
LDL Size: 19.8 nm — ABNORMAL LOW (ref >20.5)
LP-IR Score: 77 — ABNORMAL HIGH (ref <=45)
Large HDL-P: 3.1 umol/L — ABNORMAL LOW (ref >=4.8)
Large VLDL-P: 10.2 nmol/L — ABNORMAL HIGH (ref <=2.7)
Small LDL Particle Number: 2155 nmol/L — ABNORMAL HIGH (ref <=527)
Triglycerides: 296 mg/dL — ABNORMAL HIGH (ref <150)
VLDL Size: 52.9 nm — ABNORMAL HIGH (ref <=46.6)

## 2013-01-20 MED ORDER — CHOLESTYRAMINE 4 GM/DOSE PO POWD: 4.0000 g | Freq: Two times a day (BID) | ORAL | Status: DC

## 2013-01-21 ENCOUNTER — Other Ambulatory Visit: Payer: Self-pay | Admitting: Family Medicine

## 2013-01-21 DIAGNOSIS — F410 Panic disorder [episodic paroxysmal anxiety] without agoraphobia: Secondary | ICD-10-CM

## 2013-01-21 DIAGNOSIS — F411 Generalized anxiety disorder: Secondary | ICD-10-CM

## 2013-01-21 MED ORDER — ESCITALOPRAM OXALATE 20 MG PO TABS: 20.0000 mg | ORAL_TABLET | Freq: Every morning | ORAL | Status: DC

## 2013-02-10 ENCOUNTER — Other Ambulatory Visit: Payer: Self-pay | Admitting: Family Medicine

## 2013-02-18 ENCOUNTER — Other Ambulatory Visit: Payer: Self-pay | Admitting: Family Medicine

## 2013-02-19 NOTE — Telephone Encounter (Signed)
Last seen 01/19/13, last filled 11/23/12. [facility]

## 2013-02-19 NOTE — Telephone Encounter (Signed)
Okay to call in please.

## 2013-02-22 NOTE — Telephone Encounter (Signed)
rx called to [facility]

## 2013-02-23 ENCOUNTER — Other Ambulatory Visit: Payer: Self-pay | Admitting: *Deleted

## 2013-02-23 DIAGNOSIS — F410 Panic disorder [episodic paroxysmal anxiety] without agoraphobia: Secondary | ICD-10-CM

## 2013-02-23 DIAGNOSIS — F411 Generalized anxiety disorder: Secondary | ICD-10-CM

## 2013-02-23 MED ORDER — LEVOTHYROXINE SODIUM 75 MCG PO TABS: ORAL_TABLET | ORAL | Status: DC

## 2013-02-23 MED ORDER — AMLODIPINE BESYLATE 5 MG PO TABS: ORAL_TABLET | ORAL | Status: DC

## 2013-02-23 MED ORDER — ESCITALOPRAM OXALATE 20 MG PO TABS: 20.0000 mg | ORAL_TABLET | Freq: Every morning | ORAL | Status: DC

## 2013-03-01 ENCOUNTER — Other Ambulatory Visit: Payer: Self-pay | Admitting: Family Medicine

## 2013-03-04 ENCOUNTER — Ambulatory Visit (INDEPENDENT_AMBULATORY_CARE_PROVIDER_SITE_OTHER): Payer: Medicare HMO | Admitting: Pharmacist

## 2013-03-04 VITALS — BP 126/72 | HR 78 | Ht 68.5 in | Wt 194.0 lb

## 2013-03-04 DIAGNOSIS — E785 Hyperlipidemia, unspecified: Secondary | ICD-10-CM

## 2013-03-04 MED ORDER — PRAVASTATIN SODIUM 40 MG PO TABS: 40.0000 mg | ORAL_TABLET | Freq: Every day | ORAL | Status: DC

## 2013-03-04 NOTE — Progress Notes (Signed)
Lipid Clinic Consultation  Chief Complaint:   Chief Complaint  Patient presents with  . Hyperlipidemia    Filed Vitals:   03/04/13 1109  BP: 126/72  Pulse: 78   Filed Weights   03/04/13 1109  Weight: 194 lb (87.998 kg)      Exam Edema:  NEGATIVE Respirations:  NORMAL   Carotid Bruits:  NEGATIVE Xanthomas:  NEGATIVE General Appearance:  alert, oriented, no acute distress and well nourished Mood/Affect:  normal  HPI:  First visit to lipid clinic. In past Peggy Smith has taken Crestor 10mg  but stopped about 2 months ago due to myalgias and pain in legs and knees.  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  She has been prescribed Questran/cholestyramine but she has not started yet because she is unsure when she would take it so that it would not interfere with her other medications     Component Value Date/Time   TRIG 296* 01/19/2013 1620   Lipomed from 01/2013 LDL-P = 2982 LDL-C = 153 HDL-C = 43 Tg = 296   CHD/CHF Risk Equivalents:  none AHA 10 year risk of ASCVD = 17.2% NCEP Risk Factors Present:  smoker, HTN and age Primary Problem(s):  LDL or LDL-P elevated and TG elevated  Current NCEP Goals: LDL Goal < 100 HDL Goal >/= 45 Tg Goal < 045 Non-HDL Goal < 130   Low fat diet followed?  No - breakfast and lunch usually low in fat but eats out every night at various local restaurants.  She does try to avoid fried foods and chooses salads often.   Low carb diet followed?  No -   Exercise?  No -    Assessment - Dyslipidemia with intolerance to crestor.  High risk of ASCVD  Recommendations: Changes in lipid medication(s):  Discontinue Questran / cholestyramine Start pravachol 40mg  1 tablet each evening Reviewed diet - limit high fat foods and since Tg elevated recommended to keep CHO serving sizes to 1/2 cup and limit desserts and sweets Start exercise daily - walking and any other exerecise that she enjoys - goal is 40 minutes  at least 4 days per week. Recheck Lipid Panel:  04/2013   Time spent counseling patient:  45 minutes

## 2013-03-10 ENCOUNTER — Other Ambulatory Visit: Payer: Self-pay

## 2013-03-25 ENCOUNTER — Encounter: Payer: Self-pay | Admitting: *Deleted

## 2013-04-20 ENCOUNTER — Ambulatory Visit (INDEPENDENT_AMBULATORY_CARE_PROVIDER_SITE_OTHER): Payer: Medicare PPO

## 2013-04-20 ENCOUNTER — Encounter: Payer: Self-pay | Admitting: Family Medicine

## 2013-04-20 ENCOUNTER — Ambulatory Visit (INDEPENDENT_AMBULATORY_CARE_PROVIDER_SITE_OTHER): Payer: Medicare PPO | Admitting: Family Medicine

## 2013-04-20 VITALS — BP 118/72 | HR 84 | Wt 194.0 lb

## 2013-04-20 DIAGNOSIS — J302 Other seasonal allergic rhinitis: Secondary | ICD-10-CM | POA: Insufficient documentation

## 2013-04-20 DIAGNOSIS — R0609 Other forms of dyspnea: Secondary | ICD-10-CM

## 2013-04-20 DIAGNOSIS — M25571 Pain in right ankle and joints of right foot: Secondary | ICD-10-CM

## 2013-04-20 DIAGNOSIS — F17200 Nicotine dependence, unspecified, uncomplicated: Secondary | ICD-10-CM

## 2013-04-20 DIAGNOSIS — R0600 Dyspnea, unspecified: Secondary | ICD-10-CM

## 2013-04-20 DIAGNOSIS — I10 Essential (primary) hypertension: Secondary | ICD-10-CM

## 2013-04-20 DIAGNOSIS — J309 Allergic rhinitis, unspecified: Secondary | ICD-10-CM

## 2013-04-20 DIAGNOSIS — M25579 Pain in unspecified ankle and joints of unspecified foot: Secondary | ICD-10-CM

## 2013-04-20 DIAGNOSIS — E039 Hypothyroidism, unspecified: Secondary | ICD-10-CM

## 2013-04-20 DIAGNOSIS — F411 Generalized anxiety disorder: Secondary | ICD-10-CM

## 2013-04-20 DIAGNOSIS — E785 Hyperlipidemia, unspecified: Secondary | ICD-10-CM

## 2013-04-20 DIAGNOSIS — F410 Panic disorder [episodic paroxysmal anxiety] without agoraphobia: Secondary | ICD-10-CM

## 2013-04-20 NOTE — Patient Instructions (Addendum)
Smoking Cessation Quitting smoking is important to your health and has many advantages. However, it is not always easy to quit since nicotine is a very addictive drug. Often times, people try 3 times or more before being able to quit. This document explains the best ways for you to prepare to quit smoking. Quitting takes hard work and a lot of effort, but you can do it. ADVANTAGES OF QUITTING SMOKING  You will live longer, feel better, and live better.  Your body will feel the impact of quitting smoking almost immediately.  Within 20 minutes, blood pressure decreases. Your pulse returns to its normal level.  After 8 hours, carbon monoxide levels in the blood return to normal. Your oxygen level increases.  After 24 hours, the chance of having a heart attack starts to decrease. Your breath, hair, and body stop smelling like smoke.  After 48 hours, damaged nerve endings begin to recover. Your sense of taste and smell improve.  After 72 hours, the body is virtually free of nicotine. Your bronchial tubes relax and breathing becomes easier.  After 2 to 12 weeks, lungs can hold more air. Exercise becomes easier and circulation improves.  The risk of having a heart attack, stroke, cancer, or lung disease is greatly reduced.  After 1 year, the risk of coronary heart disease is cut in half.  After 5 years, the risk of stroke falls to the same as a nonsmoker.  After 10 years, the risk of lung cancer is cut in half and the risk of other cancers decreases significantly.  After 15 years, the risk of coronary heart disease drops, usually to the level of a nonsmoker.  If you are pregnant, quitting smoking will improve your chances of having a healthy baby.  The people you live with, especially any children, will be healthier.  You will have extra money to spend on things other than cigarettes. QUESTIONS TO THINK ABOUT BEFORE ATTEMPTING TO QUIT You may want to talk about your answers with your  caregiver.  Why do you want to quit?  If you tried to quit in the past, what helped and what did not?  What will be the most difficult situations for you after you quit? How will you plan to handle them?  Who can help you through the tough times? Your family? Friends? A caregiver?  What pleasures do you get from smoking? What ways can you still get pleasure if you quit? Here are some questions to ask your caregiver:  How can you help me to be successful at quitting?  What medicine do you think would be best for me and how should I take it?  What should I do if I need more help?  What is smoking withdrawal like? How can I get information on withdrawal? GET READY  Set a quit date.  Change your environment by getting rid of all cigarettes, ashtrays, matches, and lighters in your home, car, or work. Do not let people smoke in your home.  Review your past attempts to quit. Think about what worked and what did not. GET SUPPORT AND ENCOURAGEMENT You have a better chance of being successful if you have help. You can get support in many ways.  Tell your family, friends, and co-workers that you are going to quit and need their support. Ask them not to smoke around you.  Get individual, group, or telephone counseling and support. Programs are available at local hospitals and health centers. Call your local health department for   information about programs in your area.  Spiritual beliefs and practices may help some smokers quit.  Download a "quit meter" on your computer to keep track of quit statistics, such as how long you have gone without smoking, cigarettes not smoked, and money saved.  Get a self-help book about quitting smoking and staying off of tobacco. LEARN NEW SKILLS AND BEHAVIORS  Distract yourself from urges to smoke. Talk to someone, go for a walk, or occupy your time with a task.  Change your normal routine. Take a different route to work. Drink tea instead of coffee.  Eat breakfast in a different place.  Reduce your stress. Take a hot bath, exercise, or read a book.  Plan something enjoyable to do every day. Reward yourself for not smoking.  Explore interactive web-based programs that specialize in helping you quit. GET MEDICINE AND USE IT CORRECTLY Medicines can help you stop smoking and decrease the urge to smoke. Combining medicine with the above behavioral methods and support can greatly increase your chances of successfully quitting smoking.  Nicotine replacement therapy helps deliver nicotine to your body without the negative effects and risks of smoking. Nicotine replacement therapy includes nicotine gum, lozenges, inhalers, nasal sprays, and skin patches. Some may be available over-the-counter and others require a prescription.  Antidepressant medicine helps people abstain from smoking, but how this works is unknown. This medicine is available by prescription.  Nicotinic receptor partial agonist medicine simulates the effect of nicotine in your brain. This medicine is available by prescription. Ask your caregiver for advice about which medicines to use and how to use them based on your health history. Your caregiver will tell you what side effects to look out for if you choose to be on a medicine or therapy. Carefully read the information on the package. Do not use any other product containing nicotine while using a nicotine replacement product.  RELAPSE OR DIFFICULT SITUATIONS Most relapses occur within the first 3 months after quitting. Do not be discouraged if you start smoking again. Remember, most people try several times before finally quitting. You may have symptoms of withdrawal because your body is used to nicotine. You may crave cigarettes, be irritable, feel very hungry, cough often, get headaches, or have difficulty concentrating. The withdrawal symptoms are only temporary. They are strongest when you first quit, but they will go away within  10 14 days. To reduce the chances of relapse, try to:  Avoid drinking alcohol. Drinking lowers your chances of successfully quitting.  Reduce the amount of caffeine you consume. Once you quit smoking, the amount of caffeine in your body increases and can give you symptoms, such as a rapid heartbeat, sweating, and anxiety.  Avoid smokers because they can make you want to smoke.  Do not let weight gain distract you. Many smokers will gain weight when they quit, usually less than 10 pounds. Eat a healthy diet and stay active. You can always lose the weight gained after you quit.  Find ways to improve your mood other than smoking. FOR MORE INFORMATION  www.smokefree.gov  Document Released: 07/16/2001 Document Revised: 01/21/2012 Document Reviewed: 10/31/2011 [employer] Patient Information 2014 [employer].        Dr Aprel Egelhoff's Recommendations  For nutrition information, I recommend books:  1).Eat to Live by Dr Joel Fuhrman. 2).Prevent and Reverse Heart Disease by Dr Caldwell Esselstyn. 3) Dr Neal Barnard's Book:  Program to Reverse Diabetes  Exercise recommendations are:  If unable to walk, then the patient can   exercise in a chair 3 times a day. By flapping arms like a bird gently and raising legs outwards to the front.  If ambulatory, the patient can go for walks for 30 minutes 3 times a week. Then increase the intensity and duration as tolerated.  Goal is to try to attain exercise frequency to 5 times a week.  If applicable: Best to perform resistance exercises (machines or weights) 2 days a week and cardio type exercises 3 days per week.  

## 2013-04-20 NOTE — Progress Notes (Signed)
Patient ID: Peggy Smith, female   DOB: August 10, 1945, 67 y.o.   MRN: [id number] SUBJECTIVE: CC: Chief Complaint  Patient presents with  . Follow-up    3 month  twisted rt foot refill amlodipine     HPI: Patient is here for follow up of hyperlipidemia/HTN/Smoking/Hypothyroid/anxiety: denies Headache;denies Chest Pain;denies weakness;denies Shortness of Breath and orthopnea;denies Visual changes;denies palpitations;denies cough;denies pedal edema;denies symptoms of TIA or stroke;deniesClaudication symptoms. admits to Compliance with medications; denies Problems with medications.  Having allergy symptoms. Itching and runny eyes. Using eye drops.was afraid to use the chlortrimeton. The zyrtec and claritin makes her sleepy for 12 hours.  Still smoking:no changes does Tai Chi.  Feeling well. Too soon for labs. Statin dose changed 6 weeks ago.    Past Medical History  Diagnosis Date  . Iron deficiency anemia   . Hypothyroidism   . Anxiety   . Cardiac arrhythmia due to congenital heart disease   . Melanoma   . Hepatitis      1976  . Hypertension   . GERD (gastroesophageal reflux disease)   . PONV (postoperative nausea and vomiting)   . Angina     STRESS TEST NORMAL-ANXIETY  . Headache(784.0)     OTC MEDS  . Depression   . Hyperlipidemia     DOES NOT TAKE MEDS - HAS THEM AT HOME BUT STATES DOES NOT TAKE  . Panic attacks    Past Surgical History  Procedure Laterality Date  . Melanoma excision      Left Shoulder  . Tubal ligation    . Svd       X 4  . Upper gastrointestinal endoscopy    . Colonoscopy    . Dilation and curettage of uterus    . Laparoscopic hysterectomy  11/08/2011    Procedure: HYSTERECTOMY TOTAL LAPAROSCOPIC;  Surgeon: Purcell Nails, MD;  Location: [facility];  Service: Gynecology;  Laterality: N/A;  with Bilateral Salpingo-Oophorectomies  . Cystoscopy  11/08/2011    Procedure: CYSTOSCOPY;  Surgeon: Purcell Nails, MD;  Location: [facility];  Service:  Gynecology;  Laterality: N/A;   History   Social History  . Marital Status: Married    Spouse Name: N/A    Number of Children: 4  . Years of Education: N/A   Occupational History  . paralegal    Social History Main Topics  . Smoking status: Current Every Day Smoker -- 0.25 packs/day for 20 years    Types: Cigarettes  . Smokeless tobacco: Never Used  . Alcohol Use: No  . Drug Use: No  . Sexual Activity: Yes    Birth Control/ Protection: Surgical   Other Topics Concern  . Not on file   Social History Narrative  . No narrative on file   Family History  Problem Relation Age of Onset  . Dementia Mother   . Brain cancer Father   . Colon cancer Neg Hx    Current Outpatient Prescriptions on File Prior to Visit  Medication Sig Dispense Refill  . amLODipine (NORVASC) 5 MG tablet TAKE 1 TABLET BY MOUTH EVERY DAY  90 tablet  1  . aspirin 81 MG tablet Take 81 mg by mouth every morning.       . calcium carbonate (OS-CAL) 600 MG TABS Take 600 mg by mouth every evening.       . Cholecalciferol (VITAMIN D-3) 5000 UNITS TABS Take by mouth every evening.       . clonazePAM (KLONOPIN) 0.5 MG tablet TAKE 1/2-1  TABLET BY MOUTH 2 TIMES DAILY  60 tablet  1  . escitalopram (LEXAPRO) 20 MG tablet Take 1 tablet (20 mg total) by mouth every morning.  90 tablet  1  . ferrous sulfate 325 (65 FE) MG tablet TAKE 1 TABLET BY MOUTH DAILY  30 tablet  5  . levothyroxine (SYNTHROID, LEVOTHROID) 75 MCG tablet TAKE 1 TABLET (75 MCG TOTAL) BY MOUTH DAILY.  90 tablet  4  . Multiple Vitamins-Minerals (CENTRUM SILVER PO) Take 1 tablet by mouth every morning.       Marland Kitchen omeprazole (PRILOSEC) 40 MG capsule EVERY DAY  90 capsule  1  . pravastatin (PRAVACHOL) 40 MG tablet Take 1 tablet (40 mg total) by mouth daily.  30 tablet  2  . Riboflavin (VITAMIN B-2 PO) Take 100 mg by mouth every morning.       . [DISCONTINUED] esomeprazole (NEXIUM) 40 MG capsule Take 40 mg by mouth daily before supper.       . [DISCONTINUED]  hydrochlorothiazide 25 MG tablet Take 25 mg by mouth daily.         No current facility-administered medications on file prior to visit.   Allergies  Allergen Reactions  . Crestor [Rosuvastatin]     Muscle aches  . Macrodantin Other (See Comments)    Liver shut down  . Sulfa Antibiotics Other (See Comments)    agitation  . Codeine Nausea And Vomiting         There is no immunization history on file for this patient. Prior to Admission medications   Medication Sig Start Date End Date Taking? Authorizing Provider  amLODipine (NORVASC) 5 MG tablet TAKE 1 TABLET BY MOUTH EVERY DAY 02/23/13   Ileana Ladd, MD  aspirin 81 MG tablet Take 81 mg by mouth every morning.     Historical Provider, MD  calcium carbonate (OS-CAL) 600 MG TABS Take 600 mg by mouth every evening.     Historical Provider, MD  Cholecalciferol (VITAMIN D-3) 5000 UNITS TABS Take by mouth every evening.     Historical Provider, MD  clonazePAM (KLONOPIN) 0.5 MG tablet TAKE 1/2-1 TABLET BY MOUTH 2 TIMES DAILY 02/18/13   Ileana Ladd, MD  escitalopram (LEXAPRO) 20 MG tablet Take 1 tablet (20 mg total) by mouth every morning. 02/23/13   Ileana Ladd, MD  ferrous sulfate 325 (65 FE) MG tablet TAKE 1 TABLET BY MOUTH DAILY 01/14/13   Ileana Ladd, MD  levothyroxine (SYNTHROID, LEVOTHROID) 75 MCG tablet TAKE 1 TABLET (75 MCG TOTAL) BY MOUTH DAILY. 02/23/13   Ileana Ladd, MD  Multiple Vitamins-Minerals (CENTRUM SILVER PO) Take 1 tablet by mouth every morning.     Historical Provider, MD  omeprazole (PRILOSEC) 40 MG capsule EVERY DAY 03/01/13   Ileana Ladd, MD  pravastatin (PRAVACHOL) 40 MG tablet Take 1 tablet (40 mg total) by mouth daily. 03/04/13   Tammy Eckard, PHARMD  Riboflavin (VITAMIN B-2 PO) Take 100 mg by mouth every morning.     Historical Provider, MD     ROS: As above in the HPI. All other systems are stable or negative.  OBJECTIVE: APPEARANCE:  Patient in no acute distress.The patient appeared well  nourished and normally developed. Acyanotic. Waist: VITAL SIGNS:BP 118/72  Pulse 84  Wt 194 lb (87.998 kg)  BMI 29.07 kg/m2  WF  SKIN: warm and  Dry without overt rashes, tattoos and scars  HEAD and Neck: without JVD, Head and scalp: normal Eyes:No scleral icterus. Fundi normal,  eye movements normal.eyelids a little puffy Ears: Auricle normal, canal normal, Tympanic membranes normal, insufflation normal. Nose: nasal congestion Throat: normal Neck & thyroid: normal  CHEST & LUNGS: Chest wall: normal Lungs: Clear  CVS: Reveals the PMI to be normally located. Regular rhythm, First and Second Heart sounds are normal,  absence of murmurs, rubs or gallops. Peripheral vasculature: Radial pulses: normal Dorsal pedis pulses: normal Posterior pulses: normal  ABDOMEN:  Appearance: normal Benign, no organomegaly, no masses, no Abdominal Aortic enlargement. No Guarding , no rebound. No Bruits. Bowel sounds: normal  RECTAL: N/A GU: N/A  EXTREMETIES: nonedematous.  MUSCULOSKELETAL:  Spine: normal Joints: intact  NEUROLOGIC: oriented to time,place and person; nonfocal. Strength is normal Sensory is normal Reflexes are normal Cranial Nerves are normal.  Results for orders placed in visit on 01/19/13  NMR LIPOPROFILE WITH LIPIDS      Result Value Range   LDL Particle Number [id number] (*) <1000 nmol/L   LDL (calc) 153 (*) <100 mg/dL   HDL-C 43  >=16 mg/dL   Triglycerides 109 (*) <150 mg/dL   Cholesterol, Total 604 (*) <200 mg/dL   HDL Particle Number 54.0 (*) >=30.5 umol/L   Large HDL-P 3.1 (*) >=4.8 umol/L   Large VLDL-P 10.2 (*) <=2.7 nmol/L   Small LDL Particle Number [id number] (*) <=527 nmol/L   LDL Size 19.8 (*) >20.5 nm   HDL Size 8.7 (*) >=9.2 nm   VLDL Size 52.9 (*) <=46.6 nm   LP-IR Score 77 (*) <=45    ASSESSMENT: HLD (hyperlipidemia)  HTN (hypertension)  Pain in joint, ankle and foot, right - better - Plan: DG Foot Complete Right  Smoking  Unspecified  hypothyroidism  Anxiety state, unspecified  Dyspnea  Panic attacks  Seasonal allergic rhinitis  PLAN: OTC allegra. Continue present medications.  Smoking cessation.  Diet , balanced lifestyle       Dr Woodroe Mode Recommendations  For nutrition information, I recommend books:  1).Eat to Live by Dr Monico Hoar. 2).Prevent and Reverse Heart Disease by Dr Suzzette Righter. 3) Dr Katherina Right Book:  Program to Reverse Diabetes  Exercise recommendations are:  If unable to walk, then the patient can exercise in a chair 3 times a day. By flapping arms like a bird gently and raising legs outwards to the front.  If ambulatory, the patient can go for walks for 30 minutes 3 times a week. Then increase the intensity and duration as tolerated.  Goal is to try to attain exercise frequency to 5 times a week.  If applicable: Best to perform resistance exercises (machines or weights) 2 days a week and cardio type exercises 3 days per week.  Smoking cessation handout.  Return in about 2 months (around 06/20/2013) for Recheck medical problems, recheck BP.labwork too.  Zarius Furr P. [place] Charon, M.D.

## 2013-04-21 ENCOUNTER — Ambulatory Visit: Payer: Medicare HMO | Admitting: Family Medicine

## 2013-05-25 ENCOUNTER — Other Ambulatory Visit: Payer: Self-pay | Admitting: Family Medicine

## 2013-06-10 ENCOUNTER — Other Ambulatory Visit: Payer: Self-pay

## 2013-06-15 ENCOUNTER — Other Ambulatory Visit: Payer: Self-pay | Admitting: Family Medicine

## 2013-06-16 NOTE — Telephone Encounter (Signed)
Please call in clonazepam rx 

## 2013-06-16 NOTE — Telephone Encounter (Signed)
Last filled 02/18/13 with 1 RF, saw [place] Charon 04/20/13, uses [facility]

## 2013-06-17 ENCOUNTER — Other Ambulatory Visit: Payer: Self-pay | Admitting: Family Medicine

## 2013-06-17 ENCOUNTER — Telehealth: Payer: Self-pay | Admitting: *Deleted

## 2013-06-17 NOTE — Telephone Encounter (Signed)
[facility] called to [facility].

## 2013-06-18 NOTE — Telephone Encounter (Signed)
Called in.

## 2013-07-08 ENCOUNTER — Encounter: Payer: Self-pay | Admitting: Family Medicine

## 2013-07-08 ENCOUNTER — Ambulatory Visit (INDEPENDENT_AMBULATORY_CARE_PROVIDER_SITE_OTHER): Payer: Medicare PPO | Admitting: Family Medicine

## 2013-07-08 VITALS — BP 129/78 | HR 88 | Temp 98.4°F | Ht 69.0 in | Wt 193.0 lb

## 2013-07-08 DIAGNOSIS — F17200 Nicotine dependence, unspecified, uncomplicated: Secondary | ICD-10-CM

## 2013-07-08 DIAGNOSIS — K219 Gastro-esophageal reflux disease without esophagitis: Secondary | ICD-10-CM | POA: Insufficient documentation

## 2013-07-08 DIAGNOSIS — C439 Malignant melanoma of skin, unspecified: Secondary | ICD-10-CM | POA: Insufficient documentation

## 2013-07-08 DIAGNOSIS — E039 Hypothyroidism, unspecified: Secondary | ICD-10-CM

## 2013-07-08 DIAGNOSIS — I10 Essential (primary) hypertension: Secondary | ICD-10-CM

## 2013-07-08 DIAGNOSIS — F410 Panic disorder [episodic paroxysmal anxiety] without agoraphobia: Secondary | ICD-10-CM

## 2013-07-08 DIAGNOSIS — D509 Iron deficiency anemia, unspecified: Secondary | ICD-10-CM | POA: Insufficient documentation

## 2013-07-08 DIAGNOSIS — E785 Hyperlipidemia, unspecified: Secondary | ICD-10-CM

## 2013-07-08 NOTE — Patient Instructions (Signed)
Smoking Cessation Quitting smoking is important to your health and has many advantages. However, it is not always easy to quit since nicotine is a very addictive drug. Often times, people try 3 times or more before being able to quit. This document explains the best ways for you to prepare to quit smoking. Quitting takes hard work and a lot of effort, but you can do it. ADVANTAGES OF QUITTING SMOKING  You will live longer, feel better, and live better.  Your body will feel the impact of quitting smoking almost immediately.  Within 20 minutes, blood pressure decreases. Your pulse returns to its normal level.  After 8 hours, carbon monoxide levels in the blood return to normal. Your oxygen level increases.  After 24 hours, the chance of having a heart attack starts to decrease. Your breath, hair, and body stop smelling like smoke.  After 48 hours, damaged nerve endings begin to recover. Your sense of taste and smell improve.  After 72 hours, the body is virtually free of nicotine. Your bronchial tubes relax and breathing becomes easier.  After 2 to 12 weeks, lungs can hold more air. Exercise becomes easier and circulation improves.  The risk of having a heart attack, stroke, cancer, or lung disease is greatly reduced.  After 1 year, the risk of coronary heart disease is cut in half.  After 5 years, the risk of stroke falls to the same as a nonsmoker.  After 10 years, the risk of lung cancer is cut in half and the risk of other cancers decreases significantly.  After 15 years, the risk of coronary heart disease drops, usually to the level of a nonsmoker.  If you are pregnant, quitting smoking will improve your chances of having a healthy baby.  The people you live with, especially any children, will be healthier.  You will have extra money to spend on things other than cigarettes. QUESTIONS TO THINK ABOUT BEFORE ATTEMPTING TO QUIT You may want to talk about your answers with your  caregiver.  Why do you want to quit?  If you tried to quit in the past, what helped and what did not?  What will be the most difficult situations for you after you quit? How will you plan to handle them?  Who can help you through the tough times? Your family? Friends? A caregiver?  What pleasures do you get from smoking? What ways can you still get pleasure if you quit? Here are some questions to ask your caregiver:  How can you help me to be successful at quitting?  What medicine do you think would be best for me and how should I take it?  What should I do if I need more help?  What is smoking withdrawal like? How can I get information on withdrawal? GET READY  Set a quit date.  Change your environment by getting rid of all cigarettes, ashtrays, matches, and lighters in your home, car, or work. Do not let people smoke in your home.  Review your past attempts to quit. Think about what worked and what did not. GET SUPPORT AND ENCOURAGEMENT You have a better chance of being successful if you have help. You can get support in many ways.  Tell your family, friends, and co-workers that you are going to quit and need their support. Ask them not to smoke around you.  Get individual, group, or telephone counseling and support. Programs are available at local hospitals and health centers. Call your local health department for   information about programs in your area.  Spiritual beliefs and practices may help some smokers quit.  Download a "quit meter" on your computer to keep track of quit statistics, such as how long you have gone without smoking, cigarettes not smoked, and money saved.  Get a self-help book about quitting smoking and staying off of tobacco. LEARN NEW SKILLS AND BEHAVIORS  Distract yourself from urges to smoke. Talk to someone, go for a walk, or occupy your time with a task.  Change your normal routine. Take a different route to work. Drink tea instead of coffee.  Eat breakfast in a different place.  Reduce your stress. Take a hot bath, exercise, or read a book.  Plan something enjoyable to do every day. Reward yourself for not smoking.  Explore interactive web-based programs that specialize in helping you quit. GET MEDICINE AND USE IT CORRECTLY Medicines can help you stop smoking and decrease the urge to smoke. Combining medicine with the above behavioral methods and support can greatly increase your chances of successfully quitting smoking.  Nicotine replacement therapy helps deliver nicotine to your body without the negative effects and risks of smoking. Nicotine replacement therapy includes nicotine gum, lozenges, inhalers, nasal sprays, and skin patches. Some may be available over-the-counter and others require a prescription.  Antidepressant medicine helps people abstain from smoking, but how this works is unknown. This medicine is available by prescription.  Nicotinic receptor partial agonist medicine simulates the effect of nicotine in your brain. This medicine is available by prescription. Ask your caregiver for advice about which medicines to use and how to use them based on your health history. Your caregiver will tell you what side effects to look out for if you choose to be on a medicine or therapy. Carefully read the information on the package. Do not use any other product containing nicotine while using a nicotine replacement product.  RELAPSE OR DIFFICULT SITUATIONS Most relapses occur within the first 3 months after quitting. Do not be discouraged if you start smoking again. Remember, most people try several times before finally quitting. You may have symptoms of withdrawal because your body is used to nicotine. You may crave cigarettes, be irritable, feel very hungry, cough often, get headaches, or have difficulty concentrating. The withdrawal symptoms are only temporary. They are strongest when you first quit, but they will go away within  10 14 days. To reduce the chances of relapse, try to:  Avoid drinking alcohol. Drinking lowers your chances of successfully quitting.  Reduce the amount of caffeine you consume. Once you quit smoking, the amount of caffeine in your body increases and can give you symptoms, such as a rapid heartbeat, sweating, and anxiety.  Avoid smokers because they can make you want to smoke.  Do not let weight gain distract you. Many smokers will gain weight when they quit, usually less than 10 pounds. Eat a healthy diet and stay active. You can always lose the weight gained after you quit.  Find ways to improve your mood other than smoking. FOR MORE INFORMATION  www.smokefree.gov  Document Released: 07/16/2001 Document Revised: 01/21/2012 Document Reviewed: 10/31/2011 [employer] Patient Information 2014 [employer].  

## 2013-07-08 NOTE — Progress Notes (Signed)
Patient ID: Peggy Smith, female   DOB: Apr 01, 1946, 67 y.o.   MRN: [id number] SUBJECTIVE: CC: Chief Complaint  Patient presents with  . Follow-up    2 MONTH FOLLOW UP    HPI:  Patient is here for follow up of hypertension/hypothyroidism/hld/smoker: denies Headache;deniesChest Pain;denies weakness;denies Shortness of Breath or Orthopnea;denies Visual changes;denies palpitations;denies cough;denies pedal edema;denies symptoms of TIA or stroke; admits to Compliance with medications. denies Problems with medications. Continues  To smoke a few cigarettes a day with no plans to completely stop.  Otherwise she is well and came for follow up.   Past Medical History  Diagnosis Date  . Hypothyroidism   . Anxiety   . Cardiac arrhythmia due to congenital heart disease   . Hepatitis      1976  . Hypertension   . PONV (postoperative nausea and vomiting)   . Angina     STRESS TEST NORMAL-ANXIETY  . Headache(784.0)     OTC MEDS  . Depression   . Hyperlipidemia     DOES NOT TAKE MEDS - HAS THEM AT HOME BUT STATES DOES NOT TAKE  . Panic attacks   . Iron deficiency anemia   . Melanoma   . GERD (gastroesophageal reflux disease)    Past Surgical History  Procedure Laterality Date  . Melanoma excision      Left Shoulder  . Tubal ligation    . Svd       X 4  . Upper gastrointestinal endoscopy    . Colonoscopy    . Dilation and curettage of uterus    . Laparoscopic hysterectomy  11/08/2011    Procedure: HYSTERECTOMY TOTAL LAPAROSCOPIC;  Surgeon: Purcell Nails, MD;  Location: [facility];  Service: Gynecology;  Laterality: N/A;  with Bilateral Salpingo-Oophorectomies  . Cystoscopy  11/08/2011    Procedure: CYSTOSCOPY;  Surgeon: Purcell Nails, MD;  Location: [facility];  Service: Gynecology;  Laterality: N/A;   History   Social History  . Marital Status: Married    Spouse Name: N/A    Number of Children: 4  . Years of Education: N/A   Occupational History  . paralegal    Social  History Main Topics  . Smoking status: Current Every Day Smoker -- 0.25 packs/day for 20 years    Types: Cigarettes  . Smokeless tobacco: Never Used  . Alcohol Use: No  . Drug Use: No  . Sexual Activity: Yes    Birth Control/ Protection: Surgical   Other Topics Concern  . Not on file   Social History Narrative  . No narrative on file   Family History  Problem Relation Age of Onset  . Dementia Mother   . Brain cancer Father   . Colon cancer Neg Hx    Current Outpatient Prescriptions on File Prior to Visit  Medication Sig Dispense Refill  . amLODipine (NORVASC) 5 MG tablet TAKE 1 TABLET BY MOUTH EVERY DAY  90 tablet  1  . aspirin 81 MG tablet Take 81 mg by mouth every morning.       . calcium carbonate (OS-CAL) 600 MG TABS Take 600 mg by mouth every evening.       . Cholecalciferol (VITAMIN D-3) 5000 UNITS TABS Take by mouth every evening.       . clonazePAM (KLONOPIN) 0.5 MG tablet TAKE 1/2 TO 1 TABLET 2 TIMES A DAY  60 tablet  1  . escitalopram (LEXAPRO) 20 MG tablet Take 1 tablet (20 mg total) by mouth every  morning.  90 tablet  1  . ferrous sulfate 325 (65 FE) MG tablet TAKE 1 TABLET BY MOUTH DAILY  30 tablet  5  . levothyroxine (SYNTHROID, LEVOTHROID) 75 MCG tablet TAKE 1 TABLET (75 MCG TOTAL) BY MOUTH DAILY.  90 tablet  4  . Multiple Vitamins-Minerals (CENTRUM SILVER PO) Take 1 tablet by mouth every morning.       Marland Kitchen omeprazole (PRILOSEC) 40 MG capsule EVERY DAY  90 capsule  1  . pravastatin (PRAVACHOL) 40 MG tablet TAKE 1 TABLET BY MOUTH DAILY.  30 tablet  1  . Riboflavin (VITAMIN B-2 PO) Take 100 mg by mouth every morning.       . [DISCONTINUED] esomeprazole (NEXIUM) 40 MG capsule Take 40 mg by mouth daily before supper.       . [DISCONTINUED] hydrochlorothiazide 25 MG tablet Take 25 mg by mouth daily.         No current facility-administered medications on file prior to visit.   Allergies  Allergen Reactions  . Crestor [Rosuvastatin]     Muscle aches  . Macrodantin  Other (See Comments)    Liver shut down  . Sulfa Antibiotics Other (See Comments)    agitation  . Codeine Nausea And Vomiting         There is no immunization history on file for this patient. Prior to Admission medications   Medication Sig Start Date End Date Taking? Authorizing Provider  amLODipine (NORVASC) 5 MG tablet TAKE 1 TABLET BY MOUTH EVERY DAY 02/23/13   Ileana Ladd, MD  aspirin 81 MG tablet Take 81 mg by mouth every morning.     Historical Provider, MD  calcium carbonate (OS-CAL) 600 MG TABS Take 600 mg by mouth every evening.     Historical Provider, MD  Cholecalciferol (VITAMIN D-3) 5000 UNITS TABS Take by mouth every evening.     Historical Provider, MD  clonazePAM (KLONOPIN) 0.5 MG tablet TAKE 1/2 TO 1 TABLET 2 TIMES A DAY 06/15/13   [name] [name], FNP  escitalopram (LEXAPRO) 20 MG tablet Take 1 tablet (20 mg total) by mouth every morning. 02/23/13   Ileana Ladd, MD  ferrous sulfate 325 (65 FE) MG tablet TAKE 1 TABLET BY MOUTH DAILY 01/14/13   Ileana Ladd, MD  levothyroxine (SYNTHROID, LEVOTHROID) 75 MCG tablet TAKE 1 TABLET (75 MCG TOTAL) BY MOUTH DAILY. 02/23/13   Ileana Ladd, MD  Multiple Vitamins-Minerals (CENTRUM SILVER PO) Take 1 tablet by mouth every morning.     Historical Provider, MD  omeprazole (PRILOSEC) 40 MG capsule EVERY DAY 03/01/13   Ileana Ladd, MD  PATADAY 0.2 % SOLN  06/10/13   Historical Provider, MD  pravastatin (PRAVACHOL) 40 MG tablet TAKE 1 TABLET BY MOUTH DAILY. 05/25/13   Ernestina Penna, MD  Riboflavin (VITAMIN B-2 PO) Take 100 mg by mouth every morning.     Historical Provider, MD     ROS: As above in the HPI. All other systems are stable or negative.  OBJECTIVE: APPEARANCE:  Patient in no acute distress.The patient appeared well nourished and normally developed. Acyanotic. Waist: VITAL SIGNS:BP 129/78  Pulse 88  Temp(Src) 98.4 F (36.9 C) (Oral)  Ht 5\' 9"  (1.753 m)  Wt 193 lb (87.544 kg)  BMI 28.49  kg/m2  WF  SKIN: warm and  Dry without overt rashes, tattoos and scars  HEAD and Neck: without JVD, Head and scalp: normal Eyes:No scleral icterus. Fundi normal, eye movements normal. Ears: Auricle normal, canal  normal, Tympanic membranes normal, insufflation normal. Nose: normal Throat: normal Neck & thyroid: normal  CHEST & LUNGS: Chest wall: normal Lungs: Clear  CVS: Reveals the PMI to be normally located. Regular rhythm, First and Second Heart sounds are normal,  absence of murmurs, rubs or gallops. Peripheral vasculature: Radial pulses: normal Dorsal pedis pulses: normal Posterior pulses: normal  ABDOMEN:  Appearance: normal Benign, no organomegaly, no masses, no Abdominal Aortic enlargement. No Guarding , no rebound. No Bruits. Bowel sounds: normal  RECTAL: N/A GU: N/A  EXTREMETIES: nonedematous.  MUSCULOSKELETAL:  Spine: normal Joints: intact  NEUROLOGIC: oriented to time,place and person; nonfocal. Strength is normal Sensory is normal Reflexes are normal Cranial Nerves are normal. Results for orders placed in visit on 01/19/13  NMR LIPOPROFILE WITH LIPIDS      Result Value Range   LDL Particle Number [id number] (*) <1000 nmol/L   LDL (calc) 153 (*) <100 mg/dL   HDL-C 43  >=47 mg/dL   Triglycerides 829 (*) <150 mg/dL   Cholesterol, Total 562 (*) <200 mg/dL   HDL Particle Number 13.0 (*) >=30.5 umol/L   Large HDL-P 3.1 (*) >=4.8 umol/L   Large VLDL-P 10.2 (*) <=2.7 nmol/L   Small LDL Particle Number [id number] (*) <=527 nmol/L   LDL Size 19.8 (*) >20.5 nm   HDL Size 8.7 (*) >=9.2 nm   VLDL Size 52.9 (*) <=46.6 nm   LP-IR Score 77 (*) <=45    ASSESSMENT: HLD (hyperlipidemia) - Plan: CMP14+EGFR, NMR, lipoprofile, Anemia Profile B  HTN (hypertension) - Plan: CMP14+EGFR, Anemia Profile B  Smoking - Plan: Anemia Profile B  Panic attacks - Plan: Anemia Profile B  Unspecified hypothyroidism - Plan: TSH, Anemia Profile B  Melanoma - Plan: Anemia Profile  B  Iron deficiency anemia - Plan: Anemia Profile B, CANCELED: Anemia panel 7  GERD (gastroesophageal reflux disease) - Plan: Anemia Profile B  PLAN: Smoking cessation counseling and handout. Healthy lifestyle.  Orders Placed This Encounter  Procedures  . CMP14+EGFR  . NMR, lipoprofile  . TSH  . Anemia Profile B   Meds ordered this encounter  Medications  . PATADAY 0.2 % SOLN    Sig:    There are no discontinued medications. Return in about 4 months (around 11/06/2013) for Recheck medical problems.  Khamani Fairley P. [place] Charon, M.D.

## 2013-07-10 LAB — CMP14+EGFR
ALT: 14 IU/L (ref 0–32)
AST: 14 IU/L (ref 0–40)
Albumin/Globulin Ratio: 1.7 (ref 1.1–2.5)
Albumin: 4.4 g/dL (ref 3.6–4.8)
Alkaline Phosphatase: 92 IU/L (ref 39–117)
BUN/Creatinine Ratio: 13 (ref 11–26)
BUN: 14 mg/dL (ref 8–27)
CO2: 24 mmol/L (ref 18–29)
Calcium: 9.7 mg/dL (ref 8.6–10.2)
Chloride: 101 mmol/L (ref 97–108)
Creatinine, Ser: 1.04 mg/dL — ABNORMAL HIGH (ref 0.57–1.00)
GFR calc Af Amer: 64 mL/min/{1.73_m2} (ref >59)
GFR calc non Af Amer: 55 mL/min/{1.73_m2} — ABNORMAL LOW (ref >59)
Globulin, Total: 2.6 g/dL (ref 1.5–4.5)
Glucose: 92 mg/dL (ref 65–99)
Potassium: 4.3 mmol/L (ref 3.5–5.2)
Sodium: 142 mmol/L (ref 134–144)
Total Bilirubin: <0.2 mg/dL (ref 0.0–1.2)
Total Protein: 7 g/dL (ref 6.0–8.5)

## 2013-07-10 LAB — ANEMIA PROFILE B
Basophils Absolute: 0 10*3/uL (ref 0.0–0.2)
Basos: 1 %
Eos: 3 %
Eosinophils Absolute: 0.2 10*3/uL (ref 0.0–0.4)
Ferritin: 64 ng/mL (ref 15–150)
Folate: >19.9 ng/mL (ref >3.0)
HCT: 40 % (ref 34.0–46.6)
Hemoglobin: 13.2 g/dL (ref 11.1–15.9)
Immature Grans (Abs): 0 10*3/uL (ref 0.0–0.1)
Immature Granulocytes: 0 %
Iron Saturation: 25 % (ref 15–55)
Iron: 74 ug/dL (ref 35–155)
Lymphocytes Absolute: 2.7 10*3/uL (ref 0.7–3.1)
Lymphs: 33 %
MCH: 29.1 pg (ref 26.6–33.0)
MCHC: 33 g/dL (ref 31.5–35.7)
MCV: 88 fL (ref 79–97)
Monocytes Absolute: 0.6 10*3/uL (ref 0.1–0.9)
Monocytes: 7 %
Neutrophils Absolute: 4.7 10*3/uL (ref 1.4–7.0)
Neutrophils Relative %: 56 %
Platelets: 399 10*3/uL — ABNORMAL HIGH (ref 150–379)
RBC: 4.54 x10E6/uL (ref 3.77–5.28)
RDW: 13.5 % (ref 12.3–15.4)
Retic Ct Pct: 1.1 % (ref 0.6–2.6)
TIBC: 298 ug/dL (ref 250–450)
UIBC: 224 ug/dL (ref 150–375)
Vitamin B-12: 478 pg/mL (ref 211–946)
WBC: 8.2 10*3/uL (ref 3.4–10.8)

## 2013-07-10 LAB — NMR, LIPOPROFILE
Cholesterol: 195 mg/dL (ref <200)
HDL Cholesterol by NMR: 44 mg/dL (ref >=40)
HDL Particle Number: 35.9 umol/L (ref >=30.5)
LDL Particle Number: 1881 nmol/L — ABNORMAL HIGH (ref <1000)
LDL Size: 20.4 nm — ABNORMAL LOW (ref >20.5)
LDLC SERPL CALC-MCNC: 99 mg/dL (ref <100)
LP-IR Score: 72 — ABNORMAL HIGH (ref <=45)
Small LDL Particle Number: 989 nmol/L — ABNORMAL HIGH (ref <=527)
Triglycerides by NMR: 261 mg/dL — ABNORMAL HIGH (ref <150)

## 2013-07-10 LAB — TSH: TSH: 2.06 u[IU]/mL (ref 0.450–4.500)

## 2013-07-14 ENCOUNTER — Other Ambulatory Visit: Payer: Self-pay | Admitting: Family Medicine

## 2013-07-22 ENCOUNTER — Other Ambulatory Visit: Payer: Self-pay | Admitting: Family Medicine

## 2013-08-12 ENCOUNTER — Other Ambulatory Visit: Payer: Self-pay | Admitting: Family Medicine

## 2013-08-26 ENCOUNTER — Other Ambulatory Visit: Payer: Self-pay | Admitting: Family Medicine

## 2013-10-29 ENCOUNTER — Encounter: Payer: Self-pay | Admitting: Cardiovascular Disease

## 2013-11-09 ENCOUNTER — Encounter: Payer: Self-pay | Admitting: Family Medicine

## 2013-11-09 ENCOUNTER — Ambulatory Visit (INDEPENDENT_AMBULATORY_CARE_PROVIDER_SITE_OTHER): Payer: Medicare PPO | Admitting: Family Medicine

## 2013-11-09 VITALS — BP 118/74 | HR 77 | Temp 97.2°F | Ht 69.0 in | Wt 192.4 lb

## 2013-11-09 DIAGNOSIS — D509 Iron deficiency anemia, unspecified: Secondary | ICD-10-CM

## 2013-11-09 DIAGNOSIS — I10 Essential (primary) hypertension: Secondary | ICD-10-CM

## 2013-11-09 DIAGNOSIS — K219 Gastro-esophageal reflux disease without esophagitis: Secondary | ICD-10-CM

## 2013-11-09 DIAGNOSIS — F410 Panic disorder [episodic paroxysmal anxiety] without agoraphobia: Secondary | ICD-10-CM

## 2013-11-09 DIAGNOSIS — J309 Allergic rhinitis, unspecified: Secondary | ICD-10-CM

## 2013-11-09 DIAGNOSIS — Z23 Encounter for immunization: Secondary | ICD-10-CM

## 2013-11-09 DIAGNOSIS — E559 Vitamin D deficiency, unspecified: Secondary | ICD-10-CM

## 2013-11-09 DIAGNOSIS — J302 Other seasonal allergic rhinitis: Secondary | ICD-10-CM

## 2013-11-09 DIAGNOSIS — E785 Hyperlipidemia, unspecified: Secondary | ICD-10-CM

## 2013-11-09 DIAGNOSIS — E039 Hypothyroidism, unspecified: Secondary | ICD-10-CM

## 2013-11-09 DIAGNOSIS — IMO0001 Reserved for inherently not codable concepts without codable children: Secondary | ICD-10-CM

## 2013-11-09 DIAGNOSIS — F17200 Nicotine dependence, unspecified, uncomplicated: Secondary | ICD-10-CM

## 2013-11-09 LAB — POCT CBC
Granulocyte percent: 62.3 %G (ref 37–80)
HCT, POC: 42.2 % (ref 37.7–47.9)
Hemoglobin: 13.6 g/dL (ref 12.2–16.2)
Lymph, poc: 2.8 (ref 0.6–3.4)
MCH, POC: 28.1 pg (ref 27–31.2)
MCHC: 32.3 g/dL (ref 31.8–35.4)
MCV: 87.1 fL (ref 80–97)
MPV: 8.4 fL (ref 0–99.8)
POC Granulocyte: 5.5 (ref 2–6.9)
POC LYMPH PERCENT: 31.3 %L (ref 10–50)
Platelet Count, POC: 321 10*3/uL (ref 142–424)
RBC: 4.8 M/uL (ref 4.04–5.48)
RDW, POC: 13.7 %
WBC: 8.9 10*3/uL (ref 4.6–10.2)

## 2013-11-09 NOTE — Progress Notes (Signed)
Tolerated tdap without difficulty 

## 2013-11-09 NOTE — Progress Notes (Signed)
Patient ID: Peggy Smith, female   DOB: [birth year], 68 y.o.   MRN: [id number] SUBJECTIVE: CC: Chief Complaint  Patient presents with  . Follow-up    4 month follow up chronic problems    HPI: Patient is here for follow up of hyperlipidemia/tobacco smoking/seasonal allergic rhinitis/anemia/HTN: denies Headache;denies Chest Pain;denies weakness;denies Shortness of Breath and orthopnea;denies Visual changes;denies palpitations;denies cough;denies pedal edema;denies symptoms of TIA or stroke;deniesClaudication symptoms. admits to Compliance with medications; denies Problems with medications. Doing great except has a constant persistent daytime fatigue which she relates to her meds.  Past Medical History  Diagnosis Date  . Hypothyroidism   . Anxiety   . Cardiac arrhythmia due to congenital heart disease   . Hepatitis      1976  . Hypertension   . PONV (postoperative nausea and vomiting)   . Angina     STRESS TEST NORMAL-ANXIETY  . Headache(784.0)     OTC MEDS  . Depression   . Hyperlipidemia     DOES NOT TAKE MEDS - HAS THEM AT HOME BUT STATES DOES NOT TAKE  . Panic attacks   . Iron deficiency anemia   . Melanoma   . GERD (gastroesophageal reflux disease)    Past Surgical History  Procedure Laterality Date  . Melanoma excision      Left Shoulder  . Tubal ligation    . Svd       X 4  . Upper gastrointestinal endoscopy    . Colonoscopy    . Dilation and curettage of uterus    . Laparoscopic hysterectomy  11/08/2011    Procedure: HYSTERECTOMY TOTAL LAPAROSCOPIC;  Surgeon: Delice Lesch, MD;  Location: [facility];  Service: Gynecology;  Laterality: N/A;  with Bilateral Salpingo-Oophorectomies  . Cystoscopy  11/08/2011    Procedure: CYSTOSCOPY;  Surgeon: Delice Lesch, MD;  Location: [facility];  Service: Gynecology;  Laterality: N/A;   History   Social History  . Marital Status: Married    Spouse Name: N/A    Number of Children: 4  . Years of Education: N/A    Occupational History  . paralegal    Social History Main Topics  . Smoking status: Current Every Day Smoker -- 0.25 packs/day for 20 years    Types: Cigarettes  . Smokeless tobacco: Never Used  . Alcohol Use: No  . Drug Use: No  . Sexual Activity: Yes    Birth Control/ Protection: Surgical   Other Topics Concern  . Not on file   Social History Narrative  . No narrative on file   Family History  Problem Relation Age of Onset  . Dementia Mother   . Brain cancer Father   . Colon cancer Neg Hx    Current Outpatient Prescriptions on File Prior to Visit  Medication Sig Dispense Refill  . amLODipine (NORVASC) 5 MG tablet TAKE 1 TABLET BY MOUTH EVERY DAY  90 tablet  1  . amLODipine (NORVASC) 5 MG tablet TAKE 1 TABLET BY MOUTH EVERY DAY  30 tablet  4  . aspirin 81 MG tablet Take 81 mg by mouth every morning.       . calcium carbonate (OS-CAL) 600 MG TABS Take 600 mg by mouth every evening.       . Cholecalciferol (VITAMIN D-3) 5000 UNITS TABS Take by mouth every evening.       . clonazePAM (KLONOPIN) 0.5 MG tablet TAKE 1/2 TO 1 TABLET 2 TIMES A DAY  60 tablet  1  .  [facility] IRON 325 (65 FE) MG tablet TAKE 1 TABLET BY MOUTH DAILY  30 tablet  5  . escitalopram (LEXAPRO) 20 MG tablet TAKE 1 TABLET (20 MG TOTAL) BY MOUTH EVERY MORNING.  90 tablet  0  . levothyroxine (SYNTHROID, LEVOTHROID) 75 MCG tablet TAKE 1 TABLET (75 MCG TOTAL) BY MOUTH DAILY.  90 tablet  4  . Multiple Vitamins-Minerals (CENTRUM SILVER PO) Take 1 tablet by mouth every morning.       Marland Kitchen omeprazole (PRILOSEC) 40 MG capsule TAKE 1 CAPSULE EVERY DAY  90 capsule  0  . PATADAY 0.2 % SOLN       . Riboflavin (VITAMIN B-2 PO) Take 100 mg by mouth every morning.       . [DISCONTINUED] esomeprazole (NEXIUM) 40 MG capsule Take 40 mg by mouth daily before supper.       . [DISCONTINUED] hydrochlorothiazide 25 MG tablet Take 25 mg by mouth daily.         No current facility-administered medications on file prior to visit.    Allergies  Allergen Reactions  . Crestor [Rosuvastatin]     Muscle aches  . Macrodantin Other (See Comments)    Liver shut down  . Sulfa Antibiotics Other (See Comments)    agitation  . Codeine Nausea And Vomiting        Immunization History  Administered Date(s) Administered  . Tdap 11/09/2013   Prior to Admission medications   Medication Sig Start Date End Date Taking? Authorizing Provider  amLODipine (NORVASC) 5 MG tablet TAKE 1 TABLET BY MOUTH EVERY DAY 02/23/13   Vernie Shanks, MD  amLODipine (NORVASC) 5 MG tablet TAKE 1 TABLET BY MOUTH EVERY DAY 08/12/13   Vernie Shanks, MD  aspirin 81 MG tablet Take 81 mg by mouth every morning.     Historical Provider, MD  calcium carbonate (OS-CAL) 600 MG TABS Take 600 mg by mouth every evening.     Historical Provider, MD  Cholecalciferol (VITAMIN D-3) 5000 UNITS TABS Take by mouth every evening.     Historical Provider, MD  clonazePAM (KLONOPIN) 0.5 MG tablet TAKE 1/2 TO 1 TABLET 2 TIMES A DAY 06/15/13   [name] Hassell Done, FNP  [facility] IRON 325 (65 FE) MG tablet TAKE 1 TABLET BY MOUTH DAILY 07/14/13   Vernie Shanks, MD  escitalopram (LEXAPRO) 20 MG tablet TAKE 1 TABLET (20 MG TOTAL) BY MOUTH EVERY MORNING. 08/26/13   Vernie Shanks, MD  levothyroxine (SYNTHROID, LEVOTHROID) 75 MCG tablet TAKE 1 TABLET (75 MCG TOTAL) BY MOUTH DAILY. 02/23/13   Vernie Shanks, MD  Multiple Vitamins-Minerals (CENTRUM SILVER PO) Take 1 tablet by mouth every morning.     Historical Provider, MD  omeprazole (PRILOSEC) 40 MG capsule TAKE 1 CAPSULE EVERY DAY 08/26/13   Vernie Shanks, MD  PATADAY 0.2 % SOLN  06/10/13   Historical Provider, MD  pravastatin (PRAVACHOL) 40 MG tablet TAKE 1 TABLET BY MOUTH DAILY. 07/22/13   Vernie Shanks, MD  Riboflavin (VITAMIN B-2 PO) Take 100 mg by mouth every morning.     Historical Provider, MD     ROS: As above in the HPI. All other systems are stable or negative.  OBJECTIVE: APPEARANCE:  Patient in no acute distress.The  patient appeared well nourished and normally developed. Acyanotic. Waist: VITAL SIGNS:BP 118/74  Pulse 77  Temp(Src) 97.2 F (36.2 C) (Oral)  Ht 5' 9" (1.753 m)  Wt 192 lb 6.4 oz (87.272 kg)  BMI 28.40 kg/m2  WF  SKIN: warm and  Dry without overt rashes, tattoos and scars  HEAD and Neck: without JVD, Head and scalp: normal Eyes:No scleral icterus. Fundi normal, eye movements normal. Ears: Auricle normal, canal normal, Tympanic membranes normal, insufflation normal. Nose: normal Throat: normal Neck & thyroid: normal  CHEST & LUNGS: Chest wall: normal Lungs: Clear  CVS: Reveals the PMI to be normally located. Regular rhythm, First and Second Heart sounds are normal,  absence of murmurs, rubs or gallops. Peripheral vasculature: Radial pulses: normal Dorsal pedis pulses: normal Posterior pulses: normal  ABDOMEN:  Appearance: normal Benign, no organomegaly, no masses, no Abdominal Aortic enlargement. No Guarding , no rebound. No Bruits. Bowel sounds: normal  RECTAL: N/A GU: N/A  EXTREMETIES: nonedematous.  MUSCULOSKELETAL:  Spine: normal Joints: intact  NEUROLOGIC: oriented to time,place and person; nonfocal. Strength is normal Sensory is normal Reflexes are normal Cranial Nerves are normal.  ASSESSMENT: HLD (hyperlipidemia) - Plan: CMP14+EGFR, NMR, lipoprofile  Need for Tdap vaccination - Plan: Tdap vaccine greater than or equal to 7yo IM  Smoking  Iron deficiency anemia - Plan: POCT CBC  HTN (hypertension)  Unspecified hypothyroidism - Plan: TSH  Panic attacks  GERD (gastroesophageal reflux disease)  Seasonal allergic rhinitis  Vitamin D deficiency - Plan: Vit D  25 hydroxy (rtn osteoporosis monitoring)  PLAN: Smoking cessation counselling. Handouts in the AVS. Health balanced lifestyle.        Dr Paula Libra Recommendations  For nutrition information, I recommend books:  1).Eat to Live by Dr Excell Seltzer. 2).Prevent and Reverse  Heart Disease by Dr Karl Luke. 3) Dr Janene Harvey Book:  Program to Reverse Diabetes  Exercise recommendations are:  If unable to walk, then the patient can exercise in a chair 3 times a day. By flapping arms like a bird gently and raising legs outwards to the front.  If ambulatory, the patient can go for walks for 30 minutes 3 times a week. Then increase the intensity and duration as tolerated.  Goal is to try to attain exercise frequency to 5 times a week.  If applicable: Best to perform resistance exercises (machines or weights) 2 days a week and cardio type exercises 3 days per week.  Orders Placed This Encounter  Procedures  . Tdap vaccine greater than or equal to 7yo IM  . CMP14+EGFR  . NMR, lipoprofile  . TSH  . Vit D  25 hydroxy (rtn osteoporosis monitoring)  . POCT CBC   No orders of the defined types were placed in this encounter.   Medications Discontinued During This Encounter  Medication Reason  . pravastatin (PRAVACHOL) 40 MG tablet Side effect (s)   Return in about 4 months (around 03/11/2014) for Recheck medical problems. with a new provider.   Oakleigh Hesketh P. Jacelyn Grip, M.D.

## 2013-11-09 NOTE — Patient Instructions (Addendum)
Tetanus, Diphtheria, Pertussis (Tdap) Vaccine What You Need to Know WHY GET VACCINATED? Tetanus, diphtheria and pertussis can be very serious diseases, even for adolescents and adults. Tdap vaccine can protect us from these diseases. TETANUS (Lockjaw) causes painful muscle tightening and stiffness, usually all over the body.  It can lead to tightening of muscles in the head and neck so you can't open your mouth, swallow, or sometimes even breathe. Tetanus kills about 1 out of 5 people who are infected. DIPHTHERIA can cause a thick coating to form in the back of the throat.  It can lead to breathing problems, paralysis, heart failure, and death. PERTUSSIS (Whooping Cough) causes severe coughing spells, which can cause difficulty breathing, vomiting and disturbed sleep.  It can also lead to weight loss, incontinence, and rib fractures. Up to 2 in 100 adolescents and 5 in 100 adults with pertussis are hospitalized or have complications, which could include pneumonia and death. These diseases are caused by bacteria. Diphtheria and pertussis are spread from person to person through coughing or sneezing. Tetanus enters the body through cuts, scratches, or wounds. Before vaccines, the [place] saw as many as 200,000 cases a year of diphtheria and pertussis, and hundreds of cases of tetanus. Since vaccination began, tetanus and diphtheria have dropped by about 99% and pertussis by about 80%. TDAP VACCINE Tdap vaccine can protect adolescents and adults from tetanus, diphtheria, and pertussis. One dose of Tdap is routinely given at age 11 or 12. People who did not get Tdap at that age should get it as soon as possible. Tdap is especially important for health care professionals and anyone having close contact with a baby younger than 12 months. Pregnant women should get a dose of Tdap during every pregnancy, to protect the newborn from pertussis. Infants are most at risk for severe, life-threatening  complications from pertussis. A similar vaccine, called Td, protects from tetanus and diphtheria, but not pertussis. A Td booster should be given every 10 years. Tdap may be given as one of these boosters if you have not already gotten a dose. Tdap may also be given after a severe cut or burn to prevent tetanus infection. Your doctor can give you more information. Tdap may safely be given at the same time as other vaccines. SOME PEOPLE SHOULD NOT GET THIS VACCINE  If you ever had a life-threatening allergic reaction after a dose of any tetanus, diphtheria, or pertussis containing vaccine, OR if you have a severe allergy to any part of this vaccine, you should not get Tdap. Tell your doctor if you have any severe allergies.  If you had a coma, or long or multiple seizures within 7 days after a childhood dose of DTP or DTaP, you should not get Tdap, unless a cause other than the vaccine was found. You can still get Td.  Talk to your doctor if you:  have epilepsy or another nervous system problem,  had severe pain or swelling after any vaccine containing diphtheria, tetanus or pertussis,  ever had Guillain-Barr Syndrome (GBS),  aren't feeling well on the day the shot is scheduled. RISKS OF A VACCINE REACTION With any medicine, including vaccines, there is a chance of side effects. These are usually mild and go away on their own, but serious reactions are also possible. Brief fainting spells can follow a vaccination, leading to injuries from falling. Sitting or lying down for about 15 minutes can help prevent these. Tell your doctor if you feel dizzy or light-headed, or   have vision changes or ringing in the ears. Mild problems following Tdap (Did not interfere with activities)  Pain where the shot was given (about 3 in 4 adolescents or 2 in 3 adults)  Redness or swelling where the shot was given (about 1 person in 5)  Mild fever of at least 100.11F (up to about 1 in 25 adolescents or 1 in  100 adults)  Headache (about 3 or 4 people in 10)  Tiredness (about 1 person in 3 or 4)  Nausea, vomiting, diarrhea, stomach ache (up to 1 in 4 adolescents or 1 in 10 adults)  Chills, body aches, sore joints, rash, swollen glands (uncommon) Moderate problems following Tdap (Interfered with activities, but did not require medical attention)  Pain where the shot was given (about 1 in 5 adolescents or 1 in 100 adults)  Redness or swelling where the shot was given (up to about 1 in 16 adolescents or 1 in 25 adults)  Fever over 102F (about 1 in 100 adolescents or 1 in 250 adults)  Headache (about 3 in 20 adolescents or 1 in 10 adults)  Nausea, vomiting, diarrhea, stomach ache (up to 1 or 3 people in 100)  Swelling of the entire arm where the shot was given (up to about 3 in 100). Severe problems following Tdap (Unable to perform usual activities, required medical attention)  Swelling, severe pain, bleeding and redness in the arm where the shot was given (rare). A severe allergic reaction could occur after any vaccine (estimated less than 1 in a million doses). WHAT IF THERE IS A SERIOUS REACTION? What should I look for?  Look for anything that concerns you, such as signs of a severe allergic reaction, very high fever, or behavior changes. Signs of a severe allergic reaction can include hives, swelling of the face and throat, difficulty breathing, a fast heartbeat, dizziness, and weakness. These would start a few minutes to a few hours after the vaccination. What should I do?  If you think it is a severe allergic reaction or other emergency that can't wait, call 9-1-1 or get the person to the nearest hospital. Otherwise, call your doctor.  Afterward, the reaction should be reported to the "Vaccine Adverse Event Reporting System" (VAERS). Your doctor might file this report, or you can do it yourself through the VAERS web site at www.vaers.SamedayNews.es, or by calling 952-632-5966. VAERS is  only for reporting reactions. They do not give medical advice.  THE NATIONAL VACCINE INJURY COMPENSATION PROGRAM The National Vaccine Injury Compensation Program (VICP) is a federal program that was created to compensate people who may have been injured by certain vaccines. Persons who believe they may have been injured by a vaccine can learn about the program and about filing a claim by calling 984-644-1288 or visiting the [facility] website at GoldCloset.com.ee. HOW CAN I LEARN MORE?  Ask your doctor.  Call your local or state health department.  Contact the Centers for Disease Control and Prevention (CDC):  Call (708)125-8425 or visit CDC's website at http://hunter.com/. CDC Tdap Vaccine VIS (12/12/11) Document Released: 01/21/2012 Document Revised: 11/16/2012 Document Reviewed: 11/11/2012 [facility] Patient Information 2014 [place], [place].    Take 1/2 clonazepam in morning. Take the lexapro in the evening       Dr Paula Libra Recommendations  For nutrition information, I recommend books:  1).Eat to Live by Dr Excell Seltzer. 2).Prevent and Reverse Heart Disease by Dr Karl Luke. 3) Dr Janene Harvey Book:  Program to Reverse Diabetes  Exercise recommendations  are:  If unable to walk, then the patient can exercise in a chair 3 times a day. By flapping arms like a bird gently and raising legs outwards to the front.  If ambulatory, the patient can go for walks for 30 minutes 3 times a week. Then increase the intensity and duration as tolerated.  Goal is to try to attain exercise frequency to 5 times a week.  If applicable: Best to perform resistance exercises (machines or weights) 2 days a week and cardio type exercises 3 days per week.

## 2013-11-10 LAB — CMP14+EGFR
ALT: 19 IU/L (ref 0–32)
AST: 18 IU/L (ref 0–40)
Albumin/Globulin Ratio: 1.7 (ref 1.1–2.5)
Albumin: 4.5 g/dL (ref 3.6–4.8)
Alkaline Phosphatase: 84 IU/L (ref 39–117)
BUN/Creatinine Ratio: 13 (ref 11–26)
BUN: 16 mg/dL (ref 8–27)
CO2: 26 mmol/L (ref 18–29)
Calcium: 9.9 mg/dL (ref 8.7–10.3)
Chloride: 98 mmol/L (ref 97–108)
Creatinine, Ser: 1.23 mg/dL — ABNORMAL HIGH (ref 0.57–1.00)
GFR calc Af Amer: 52 mL/min/{1.73_m2} — ABNORMAL LOW (ref >59)
GFR calc non Af Amer: 45 mL/min/{1.73_m2} — ABNORMAL LOW (ref >59)
Globulin, Total: 2.6 g/dL (ref 1.5–4.5)
Glucose: 97 mg/dL (ref 65–99)
Potassium: 4.9 mmol/L (ref 3.5–5.2)
Sodium: 140 mmol/L (ref 134–144)
Total Bilirubin: 0.3 mg/dL (ref 0.0–1.2)
Total Protein: 7.1 g/dL (ref 6.0–8.5)

## 2013-11-10 LAB — NMR, LIPOPROFILE
Cholesterol: 257 mg/dL — ABNORMAL HIGH (ref <200)
HDL Cholesterol by NMR: 46 mg/dL (ref >=40)
HDL Particle Number: 29.7 umol/L — ABNORMAL LOW (ref >=30.5)
LDL Particle Number: 2580 nmol/L — ABNORMAL HIGH (ref <1000)
LDL Size: 20.2 nm — ABNORMAL LOW (ref >20.5)
LDLC SERPL CALC-MCNC: 158 mg/dL — ABNORMAL HIGH (ref <100)
LP-IR Score: 84 — ABNORMAL HIGH (ref <=45)
Small LDL Particle Number: 1536 nmol/L — ABNORMAL HIGH (ref <=527)
Triglycerides by NMR: 264 mg/dL — ABNORMAL HIGH (ref <150)

## 2013-11-10 LAB — VITAMIN D 25 HYDROXY (VIT D DEFICIENCY, FRACTURES): Vit D, 25-Hydroxy: 57 ng/mL (ref 30.0–100.0)

## 2013-11-10 LAB — TSH: TSH: 3.55 u[IU]/mL (ref 0.450–4.500)

## 2013-11-14 NOTE — Progress Notes (Signed)
Quick Note:  Call Patient Labs that are abnormal: Kidney function deteriorating. The lipids are still very high  The rest are at goal  Recommendations: Needs to stop smoking to protect the kidneys. Needs to make an appointment with Tammy or Sharyn Lull to review diet and medications to see which is best options to treat her lipids.   ______

## 2013-11-28 ENCOUNTER — Other Ambulatory Visit: Payer: Self-pay | Admitting: Family Medicine

## 2013-12-14 ENCOUNTER — Other Ambulatory Visit: Payer: Self-pay

## 2013-12-14 MED ORDER — CLONAZEPAM 0.5 MG PO TABS
ORAL_TABLET | ORAL | Status: DC
Start: 2013-12-14 — End: 2014-08-28

## 2013-12-14 NOTE — Telephone Encounter (Signed)
This is okay to refill and can be called into the pharmacy, let patient know

## 2013-12-14 NOTE — Telephone Encounter (Signed)
last seen 11/09/13  FPW  If approved route to nurse to [facility]

## 2013-12-15 NOTE — Telephone Encounter (Signed)
CaLLED IN 

## 2013-12-29 ENCOUNTER — Other Ambulatory Visit: Payer: Self-pay | Admitting: *Deleted

## 2013-12-29 MED ORDER — AMLODIPINE BESYLATE 5 MG PO TABS: ORAL_TABLET | ORAL | Status: DC

## 2013-12-30 ENCOUNTER — Other Ambulatory Visit: Payer: Self-pay

## 2013-12-30 MED ORDER — AMLODIPINE BESYLATE 5 MG PO TABS: ORAL_TABLET | ORAL | Status: DC

## 2014-01-25 ENCOUNTER — Other Ambulatory Visit: Payer: Self-pay | Admitting: Family Medicine

## 2014-03-01 ENCOUNTER — Other Ambulatory Visit: Payer: Self-pay | Admitting: *Deleted

## 2014-03-01 MED ORDER — ESCITALOPRAM OXALATE 20 MG PO TABS: ORAL_TABLET | ORAL | Status: DC

## 2014-04-13 ENCOUNTER — Ambulatory Visit (INDEPENDENT_AMBULATORY_CARE_PROVIDER_SITE_OTHER): Payer: Medicare PPO | Admitting: Family Medicine

## 2014-04-13 ENCOUNTER — Encounter: Payer: Self-pay | Admitting: Family Medicine

## 2014-04-13 VITALS — BP 115/74 | HR 81 | Temp 98.5°F | Ht 69.0 in | Wt 196.0 lb

## 2014-04-13 DIAGNOSIS — E785 Hyperlipidemia, unspecified: Secondary | ICD-10-CM

## 2014-04-13 DIAGNOSIS — E039 Hypothyroidism, unspecified: Secondary | ICD-10-CM

## 2014-04-13 DIAGNOSIS — I10 Essential (primary) hypertension: Secondary | ICD-10-CM

## 2014-04-13 NOTE — Patient Instructions (Signed)
Consider Zostavax and Pneumonia vaccine (Prevnar)   Herpes Zoster Virus Vaccine What is this medicine? HERPES ZOSTER VIRUS VACCINE (HUR peez ZOS ter vahy ruhs vak SEEN) is a vaccine. It is used to prevent shingles in adults 69 years old and over. This vaccine is not used to treat shingles or nerve pain from shingles. This medicine may be used for other purposes; ask your health care provider or pharmacist if you have questions. COMMON BRAND NAME(S): Varivax, Zostavax What should I tell my health care provider before I take this medicine? They need to know if you have any of these conditions: -cancer like leukemia or lymphoma -immune system problems or therapy -infection with fever -tuberculosis -an unusual or allergic reaction to vaccines, neomycin, gelatin, other medicines, foods, dyes, or preservatives -pregnant or trying to get pregnant -breast-feeding How should I use this medicine? This vaccine is for injection under the skin. It is given by a health care professional. Talk to your pediatrician regarding the use of this medicine in children. This medicine is not approved for use in children. Overdosage: If you think you have taken too much of this medicine contact a poison control center or emergency room at once. NOTE: This medicine is only for you. Do not share this medicine with others. What if I miss a dose? This does not apply. What may interact with this medicine? Do not take this medicine with any of the following medications: -adalimumab -anakinra -etanercept -infliximab -medicines to treat cancer -medicines that suppress your immune system This medicine may also interact with the following medications: -immunoglobulins -steroid medicines like prednisone or cortisone This list may not describe all possible interactions. Give your health care provider a list of all the medicines, herbs, non-prescription drugs, or dietary supplements you use. Also tell them if you smoke,  drink alcohol, or use illegal drugs. Some items may interact with your medicine. What should I watch for while using this medicine? Visit your doctor for regular check ups. This vaccine, like all vaccines, may not fully protect everyone. After receiving this vaccine it may be possible to pass chickenpox infection to others. Avoid people with immune system problems, pregnant women who have not had chickenpox, and newborns of women who have not had chickenpox. Talk to your doctor for more information. What side effects may I notice from receiving this medicine? Side effects that you should report to your doctor or health care professional as soon as possible: -allergic reactions like skin rash, itching or hives, swelling of the face, lips, or tongue -breathing problems -feeling faint or lightheaded, falls -fever, flu-like symptoms -pain, tingling, numbness in the hands or feet -swelling of the ankles, feet, hands -unusually weak or tired Side effects that usually do not require medical attention (report to your doctor or health care professional if they continue or are bothersome): -aches or pains -chickenpox-like rash -diarrhea -headache -loss of appetite -nausea, vomiting -redness, pain, swelling at site where injected -runny nose This list may not describe all possible side effects. Call your doctor for medical advice about side effects. You may report side effects to FDA at 1-800-FDA-1088. Where should I keep my medicine? This drug is given in a hospital or clinic and will not be stored at home. NOTE: This sheet is a summary. It may not cover all possible information. If you have questions about this medicine, talk to your doctor, pharmacist, or health care provider.  2015, Elsevier/Gold Standard. (2010-01-08 17:43:50)   Pneumococcal Vaccine, Polyvalent suspension for injection What  is this medicine? PNEUMOCOCCAL VACCINE, POLYVALENT (NEU mo KOK al vak SEEN, pol ee VEY luhnt) is a  vaccine to prevent pneumococcus bacteria infection. These bacteria are a major cause of ear infections, 'Strep throat' infections, and serious pneumonia, meningitis, or blood infections worldwide. These vaccines help the body to produce antibodies (protective substances) that help your body defend against these bacteria. This vaccine is recommended for infants and young children. This vaccine will not treat an infection. This medicine may be used for other purposes; ask your health care provider or pharmacist if you have questions. COMMON BRAND NAME(S): Prevnar 13 What should I tell my health care provider before I take this medicine? They need to know if you have any of these conditions: -bleeding problems -fever -immune system problems -low platelet count in the blood -seizures -an unusual or allergic reaction to pneumococcal vaccine, diphtheria toxoid, other vaccines, latex, other medicines, foods, dyes, or preservatives -pregnant or trying to get pregnant -breast-feeding How should I use this medicine? This vaccine is for injection into a muscle. It is given by a health care professional. A copy of Vaccine Information Statements will be given before each vaccination. Read this sheet carefully each time. The sheet may change frequently. Talk to your pediatrician regarding the use of this medicine in children. While this drug may be prescribed for children as young as 5 weeks old for selected conditions, precautions do apply. Overdosage: If you think you have taken too much of this medicine contact a poison control center or emergency room at once. NOTE: This medicine is only for you. Do not share this medicine with others. What if I miss a dose? It is important not to miss your dose. Call your doctor or health care professional if you are unable to keep an appointment. What may interact with this medicine? -medicines for cancer chemotherapy -medicines that suppress your immune  function -medicines that treat or prevent blood clots like warfarin, enoxaparin, and dalteparin -steroid medicines like prednisone or cortisone This list may not describe all possible interactions. Give your health care provider a list of all the medicines, herbs, non-prescription drugs, or dietary supplements you use. Also tell them if you smoke, drink alcohol, or use illegal drugs. Some items may interact with your medicine. What should I watch for while using this medicine? Mild fever and pain should go away in 3 days or less. Report any unusual symptoms to your doctor or health care professional. What side effects may I notice from receiving this medicine? Side effects that you should report to your doctor or health care professional as soon as possible: -allergic reactions like skin rash, itching or hives, swelling of the face, lips, or tongue -breathing problems -confused -fever over 102 degrees F -pain, tingling, numbness in the hands or feet -seizures -unusual bleeding or bruising -unusual muscle weakness Side effects that usually do not require medical attention (report to your doctor or health care professional if they continue or are bothersome): -aches and pains -diarrhea -fever of 102 degrees F or less -headache -irritable -loss of appetite -pain, tender at site where injected -trouble sleeping This list may not describe all possible side effects. Call your doctor for medical advice about side effects. You may report side effects to FDA at 1-800-FDA-1088. Where should I keep my medicine? This does not apply. This vaccine is given in a clinic, pharmacy, doctor's office, or other health care setting and will not be stored at home. NOTE: This sheet is a summary. It  may not cover all possible information. If you have questions about this medicine, talk to your doctor, pharmacist, or health care provider.  2015, Elsevier/Gold Standard. (2008-10-04 10:17:22)

## 2014-04-13 NOTE — Progress Notes (Signed)
   Subjective:    Patient ID: Peggy Smith, female    DOB: [birth year], 68 y.o.   MRN: [id number]  HPI 68 year old female who is here to followup with her hypertension, panic disorder, and hypothyroidism. She is currently doing very well. She really took some time today to bring me up on her medical issues. She had fairly severe panic disorder with agorophobia; it is controlled with Klonopin and Lexapro. She also had what sounds like fairly severe vaginal bleeding and ended up with hysterectomy.    Review of Systems  Constitutional: Negative.   HENT: Negative.   Eyes: Negative.   Respiratory: Negative.   Cardiovascular: Negative.   Gastrointestinal: Negative.   Endocrine: Negative.   Genitourinary: Negative.   Musculoskeletal: Positive for myalgias.       Bilateral thumb pain  Skin: Rash: not true rash but healing intertrigo.  Hematological: Negative.   Psychiatric/Behavioral: Negative.        Objective:   Physical Exam  Constitutional: She is oriented to person, place, and time. She appears well-developed and well-nourished.  Eyes: Conjunctivae and EOM are normal.  Neck: Normal range of motion. Neck supple.  Cardiovascular: Normal rate, regular rhythm and normal heart sounds.   Pulmonary/Chest: Effort normal and breath sounds normal.  Abdominal: Soft. Bowel sounds are normal.  Musculoskeletal: Normal range of motion.  Neurological: She is alert and oriented to person, place, and time. She has normal reflexes.  Skin: Skin is warm and dry.  Psychiatric: She has a normal mood and affect. Her behavior is normal. Thought content normal.     BP 115/74  Pulse 81  Temp(Src) 98.5 F (36.9 C) (Oral)  Ht 5\' 9"  (1.753 m)  Wt 196 lb (88.905 kg)  BMI 28.93 kg/m2     Assessment & Plan:  1. Unspecified hypothyroidism TSH at goal; recheck at 1 yr  2. Essential hypertension BP controlled; no side effect  3. HLD (hyperlipidemia) CHD risk is 6%; does not need treatment  now  Wardell Honour MD

## 2014-05-27 ENCOUNTER — Other Ambulatory Visit: Payer: Self-pay

## 2014-05-27 MED ORDER — OMEPRAZOLE 40 MG PO CPDR: DELAYED_RELEASE_CAPSULE | ORAL | Status: DC

## 2014-05-28 ENCOUNTER — Other Ambulatory Visit: Payer: Self-pay | Admitting: Family Medicine

## 2014-06-03 ENCOUNTER — Telehealth: Payer: Self-pay | Admitting: Family Medicine

## 2014-06-06 ENCOUNTER — Other Ambulatory Visit: Payer: Self-pay

## 2014-06-06 DIAGNOSIS — N6489 Other specified disorders of breast: Secondary | ICD-10-CM

## 2014-06-09 NOTE — Telephone Encounter (Signed)
Pt aware of appointment date/time and has been asked to call the [facility] the Monday before her appointment for verification of receeipt of ooutside films

## 2014-06-20 ENCOUNTER — Other Ambulatory Visit: Payer: Self-pay | Admitting: Family

## 2014-06-22 ENCOUNTER — Ambulatory Visit
Admission: RE | Admit: 2014-06-22 | Discharge: 2014-06-22 | Disposition: A | Discharge status: Home / Self Care | Payer: Medicare PPO | Source: Ambulatory Visit | Attending: Family Medicine | Admitting: Family Medicine

## 2014-06-22 DIAGNOSIS — N6489 Other specified disorders of breast: Secondary | ICD-10-CM

## 2014-07-14 ENCOUNTER — Other Ambulatory Visit: Payer: Self-pay | Admitting: Family

## 2014-07-15 ENCOUNTER — Other Ambulatory Visit: Payer: Self-pay | Admitting: *Deleted

## 2014-08-28 ENCOUNTER — Other Ambulatory Visit: Payer: Self-pay | Admitting: Family Medicine

## 2014-08-29 NOTE — Telephone Encounter (Signed)
This is Millers pt, I know he will not be back till Wed. Last seen 04/13/14, last filled 03/12/14. If approved call into [facility]

## 2014-08-30 ENCOUNTER — Other Ambulatory Visit: Payer: Self-pay | Admitting: *Deleted

## 2014-08-30 MED ORDER — ESCITALOPRAM OXALATE 20 MG PO TABS: ORAL_TABLET | ORAL | Status: DC

## 2014-09-02 NOTE — Telephone Encounter (Signed)
rx phoned into pharmacy

## 2014-11-16 ENCOUNTER — Ambulatory Visit (INDEPENDENT_AMBULATORY_CARE_PROVIDER_SITE_OTHER): Payer: Medicare PPO | Admitting: Family Medicine

## 2014-11-16 ENCOUNTER — Encounter: Payer: Self-pay | Admitting: Family Medicine

## 2014-11-16 VITALS — BP 118/73 | HR 80 | Temp 97.2°F | Ht 69.0 in | Wt 195.0 lb

## 2014-11-16 DIAGNOSIS — E039 Hypothyroidism, unspecified: Secondary | ICD-10-CM

## 2014-11-16 DIAGNOSIS — I10 Essential (primary) hypertension: Secondary | ICD-10-CM | POA: Diagnosis not present

## 2014-11-16 DIAGNOSIS — E785 Hyperlipidemia, unspecified: Secondary | ICD-10-CM

## 2014-11-16 NOTE — Progress Notes (Signed)
Subjective:    Patient ID: Peggy Smith, female    DOB: 1946/01/01, 69 y.o.   MRN: [id number]  HPI 69 year old female here to follow-up hypertension, hyperlipidemia, and anxiety disorder. Regarding the latter, as long as she takes Lexapro and occasional Klonopin the symptoms seem to be well controlled.  Hypertension: She takes amlodipine 5 mg. There are no side effects and she is compliant and blood pressure is well controlled.  Hyperlipidemia previously, her cardiac risk assessment was low when her numbers are inserted into the equation today risk is greater than 10%. Will update her lipids in the lab and decide about treatment. She seems to be statin intolerant but she might be able to take Livalo.  Patient Active Problem List   Diagnosis Date Noted  . Need for Tdap vaccination 11/09/2013  . Iron deficiency anemia   . Melanoma   . GERD (gastroesophageal reflux disease)   . Seasonal allergic rhinitis 04/20/2013  . Pain in joint, ankle and foot 04/20/2013  . Panic attacks 01/19/2013  . Anxiety state, unspecified 01/19/2013  . HLD (hyperlipidemia) 01/19/2013  . Unspecified hypothyroidism 01/19/2013  . Chest pain 01/14/2011  . Dyspnea 01/14/2011  . Smoking 01/14/2011  . Elevated lipids 01/14/2011  . HTN (hypertension) 01/14/2011   Outpatient Encounter Prescriptions as of 11/16/2014  Medication Sig  . amLODipine (NORVASC) 5 MG tablet TAKE 1 TABLET BY MOUTH EVERY DAY  . aspirin 81 MG tablet Take 81 mg by mouth every morning.   . calcium carbonate (OS-CAL) 600 MG TABS Take 600 mg by mouth every evening.   . Cholecalciferol (VITAMIN D-3) 5000 UNITS TABS Take by mouth every evening.   . clonazePAM (KLONOPIN) 0.5 MG tablet TAKE 1/2 TO 1 TABLET 2 TIMES A DAY  . escitalopram (LEXAPRO) 20 MG tablet TAKE 1 TABLET (20 MG TOTAL) BY MOUTH EVERY MORNING.  Marland Kitchen levothyroxine (SYNTHROID, LEVOTHROID) 75 MCG tablet TAKE 1 TABLET (75 MCG TOTAL) BY MOUTH DAILY.  . magnesium 30 MG tablet Take 30 mg  by mouth daily.  . Multiple Vitamins-Minerals (CENTRUM SILVER PO) Take 1 tablet by mouth every morning.   Marland Kitchen omeprazole (PRILOSEC) 40 MG capsule TAKE 1 CAPSULE EVERY DAY  . PATADAY 0.2 % SOLN   . Riboflavin (VITAMIN B-2 PO) Take 100 mg by mouth every morning.   . [DISCONTINUED] amLODipine (NORVASC) 5 MG tablet TAKE 1 TABLET BY MOUTH EVERY DAY  . [DISCONTINUED] amLODipine (NORVASC) 5 MG tablet TAKE 1 TABLET BY MOUTH EVERY DAY     Review of Systems  Constitutional: Negative.   HENT: Positive for congestion and sinus pressure.   Eyes: Negative.   Respiratory: Negative.   Cardiovascular: Negative.   Gastrointestinal: Negative.   Endocrine: Negative.   Genitourinary: Negative.   Hematological: Negative.   Psychiatric/Behavioral: Negative.        Objective:   Physical Exam  Constitutional: She is oriented to person, place, and time. She appears well-developed and well-nourished.  Eyes: Conjunctivae and EOM are normal.  Neck: Normal range of motion. Neck supple.  Cardiovascular: Normal rate, regular rhythm and normal heart sounds.   Pulmonary/Chest: Effort normal and breath sounds normal.  Abdominal: Soft. Bowel sounds are normal.  Musculoskeletal: Normal range of motion.  Neurological: She is alert and oriented to person, place, and time. She has normal reflexes.  Skin: Skin is warm and dry.  Psychiatric: She has a normal mood and affect. Her behavior is normal. Thought content normal.    BP 118/73 mmHg  Pulse 80  Temp(Src) 97.2 F (36.2 C) (Oral)  Ht 5' 9"  (1.753 m)  Wt 195 lb (88.451 kg)  BMI 28.78 kg/m2       Assessment & Plan:  1. Hypothyroidism, unspecified hypothyroidism type She is having symptoms suggestive of underactive thyroid. - TSH  2. Essential hypertension Blood pressure well controlled on 5 mg amlodipine. Continue same  3. HLD (hyperlipidemia) Check lipids today and consider treatment    Wardell Honour MD - [id number]+EGFR - Lipid panel

## 2014-11-17 LAB — LIPID PANEL
CHOL/HDL RATIO: 5.7 ratio — AB (ref 0.0–4.4)
Cholesterol, Total: 255 mg/dL — ABNORMAL HIGH (ref 100–199)
HDL: 45 mg/dL (ref >39)
LDL CALC: 165 mg/dL — AB (ref 0–99)
Triglycerides: 227 mg/dL — ABNORMAL HIGH (ref 0–149)
VLDL Cholesterol Cal: 45 mg/dL — ABNORMAL HIGH (ref 5–40)

## 2014-11-17 LAB — CMP14+EGFR
ALT: 15 IU/L (ref 0–32)
AST: 16 IU/L (ref 0–40)
Albumin/Globulin Ratio: 1.4 (ref 1.1–2.5)
Albumin: 4.3 g/dL (ref 3.6–4.8)
Alkaline Phosphatase: 92 IU/L (ref 39–117)
BUN / CREAT RATIO: 12 (ref 11–26)
BUN: 11 mg/dL (ref 8–27)
Bilirubin Total: 0.3 mg/dL (ref 0.0–1.2)
CALCIUM: 9.8 mg/dL (ref 8.7–10.3)
CHLORIDE: 101 mmol/L (ref 97–108)
CO2: 25 mmol/L (ref 18–29)
Creatinine, Ser: 0.93 mg/dL (ref 0.57–1.00)
GFR calc non Af Amer: 63 mL/min/{1.73_m2} (ref >59)
GFR, EST AFRICAN AMERICAN: 73 mL/min/{1.73_m2} (ref >59)
Globulin, Total: 3 g/dL (ref 1.5–4.5)
Glucose: 92 mg/dL (ref 65–99)
POTASSIUM: 4.6 mmol/L (ref 3.5–5.2)
SODIUM: 142 mmol/L (ref 134–144)
Total Protein: 7.3 g/dL (ref 6.0–8.5)

## 2014-11-17 LAB — TSH: TSH: 2.4 u[IU]/mL (ref 0.450–4.500)

## 2014-11-25 ENCOUNTER — Other Ambulatory Visit: Payer: Self-pay | Admitting: Nurse Practitioner

## 2014-11-28 ENCOUNTER — Other Ambulatory Visit: Payer: Self-pay | Admitting: *Deleted

## 2014-11-28 MED ORDER — ESCITALOPRAM OXALATE 20 MG PO TABS: ORAL_TABLET | ORAL | Status: DC

## 2014-11-29 ENCOUNTER — Other Ambulatory Visit: Payer: Self-pay | Admitting: Family Medicine

## 2015-01-06 ENCOUNTER — Other Ambulatory Visit: Payer: Self-pay | Admitting: *Deleted

## 2015-01-06 MED ORDER — AMLODIPINE BESYLATE 5 MG PO TABS: 5.0000 mg | ORAL_TABLET | Freq: Every day | ORAL | Status: DC

## 2015-01-07 ENCOUNTER — Other Ambulatory Visit: Payer: Self-pay | Admitting: Family Medicine

## 2015-01-10 ENCOUNTER — Other Ambulatory Visit: Payer: Self-pay

## 2015-01-10 NOTE — Telephone Encounter (Signed)
Last seen 11/16/14 Dr Sabra Heck  IF approved route to nurse to call [facility]

## 2015-01-12 MED ORDER — CLONAZEPAM 0.5 MG PO TABS: ORAL_TABLET | ORAL | Status: DC

## 2015-01-12 NOTE — Telephone Encounter (Signed)
Called to [facility] 

## 2015-02-28 ENCOUNTER — Other Ambulatory Visit: Payer: Self-pay | Admitting: *Deleted

## 2015-02-28 MED ORDER — ESCITALOPRAM OXALATE 20 MG PO TABS: ORAL_TABLET | ORAL | Status: DC

## 2015-04-04 ENCOUNTER — Other Ambulatory Visit: Payer: Self-pay | Admitting: Family Medicine

## 2015-05-15 ENCOUNTER — Other Ambulatory Visit: Payer: Self-pay | Admitting: *Deleted

## 2015-05-15 MED ORDER — CLONAZEPAM 0.5 MG PO TABS: ORAL_TABLET | ORAL | Status: DC

## 2015-05-15 NOTE — Telephone Encounter (Signed)
Last filled 01/07/15, last seen 11/16/14, next appt 05/23/15. If approved [facility] into [facility]

## 2015-05-19 ENCOUNTER — Other Ambulatory Visit: Payer: Self-pay | Admitting: Family Medicine

## 2015-05-23 ENCOUNTER — Ambulatory Visit (INDEPENDENT_AMBULATORY_CARE_PROVIDER_SITE_OTHER): Payer: Medicare PPO | Admitting: Family Medicine

## 2015-05-23 ENCOUNTER — Encounter: Payer: Self-pay | Admitting: Family Medicine

## 2015-05-23 VITALS — BP 125/64 | HR 92 | Temp 97.7°F | Ht 69.0 in | Wt 186.0 lb

## 2015-05-23 DIAGNOSIS — E039 Hypothyroidism, unspecified: Secondary | ICD-10-CM

## 2015-05-23 DIAGNOSIS — I10 Essential (primary) hypertension: Secondary | ICD-10-CM

## 2015-05-23 NOTE — Progress Notes (Signed)
Subjective:    Patient ID: Peggy Smith, female    DOB: 1946-05-06, 69 y.o.   MRN: [id number]  HPI  69 year old female here to follow-up hypertension anxiety and hypothyroidism. Her only concern or complaint today is a nonhealing area on the left mandible area of her face. There's been no pain or bleeding it just seems to not want to go away. She continues to take Klonopin and Lexapro for anxiety and depression and says she is a happy camper as long as she takes his medicines. She has had no edema related to amlodipine and her blood pressures have been at goal. There is a past history of melanoma from her shoulder but that is a past history of rather ongoing problem.  Patient Active Problem List   Diagnosis Date Noted  . Need for Tdap vaccination 11/09/2013  . Iron deficiency anemia   . Melanoma ([place])   . GERD (gastroesophageal reflux disease)   . Seasonal allergic rhinitis 04/20/2013  . Pain in joint, ankle and foot 04/20/2013  . Panic attacks 01/19/2013  . Anxiety state, unspecified 01/19/2013  . HLD (hyperlipidemia) 01/19/2013  . Hypothyroidism 01/19/2013  . Chest pain 01/14/2011  . Dyspnea 01/14/2011  . Smoking 01/14/2011  . Elevated lipids 01/14/2011  . HTN (hypertension) 01/14/2011   Outpatient Encounter Prescriptions as of 05/23/2015  Medication Sig  . amLODipine (NORVASC) 5 MG tablet TAKE 1 TABLET (5 MG TOTAL) BY MOUTH DAILY.  Marland Kitchen aspirin 81 MG tablet Take 81 mg by mouth every morning.   . calcium carbonate (OS-CAL) 600 MG TABS Take 600 mg by mouth every evening.   . Cholecalciferol (VITAMIN D-3) 5000 UNITS TABS Take by mouth every evening.   . clonazePAM (KLONOPIN) 0.5 MG tablet TAKE 1/2 TO 1 TABLET 2 TIMES A DAY  . escitalopram (LEXAPRO) 20 MG tablet TAKE 1 TABLET (20 MG TOTAL) BY MOUTH EVERY MORNING.  Marland Kitchen levothyroxine (SYNTHROID, LEVOTHROID) 75 MCG tablet TAKE 1 TABLET (75 MCG TOTAL) BY MOUTH DAILY.  . magnesium 30 MG tablet Take 30 mg by mouth daily.  . Multiple  Vitamins-Minerals (CENTRUM SILVER PO) Take 1 tablet by mouth every morning.   Marland Kitchen omeprazole (PRILOSEC) 40 MG capsule TAKE 1 CAPSULE EVERY DAY  . PATADAY 0.2 % SOLN   . Riboflavin (VITAMIN B-2 PO) Take 100 mg by mouth every morning.   . [DISCONTINUED] amLODipine (NORVASC) 5 MG tablet TAKE 1 TABLET (5 MG TOTAL) BY MOUTH DAILY.  . [DISCONTINUED] levothyroxine (SYNTHROID, LEVOTHROID) 75 MCG tablet TAKE 1 TABLET (75 MCG TOTAL) BY MOUTH DAILY.   No facility-administered encounter medications on file as of 05/23/2015.      Review of Systems  Constitutional: Negative.   Respiratory: Negative.   Cardiovascular: Negative.   Gastrointestinal: Negative.   Genitourinary: Negative.   Neurological: Negative.   Psychiatric/Behavioral: Negative.        Objective:   Physical Exam  Constitutional: She is oriented to person, place, and time. She appears well-developed and well-nourished.  Cardiovascular: Normal rate, regular rhythm and normal heart sounds.   Pulmonary/Chest: Effort normal and breath sounds normal.  Neurological: She is alert and oriented to person, place, and time.  Skin:  Nonhealing lesion on left side of face is suspicious for skin cancer with a heaped up border and crater-like area in the middle. We'll plan to do a small biopsy when she can be worked into schedule  Psychiatric: She has a normal mood and affect. Thought content normal.  Assessment & Plan:  1. Essential hypertension Blood pressure well controlled on amlodipine. Continue same  2. Hypothyroidism, unspecified hypothyroidism type Last TSH was 2.4 in April 2016. Will recheck at one-year anniversary  Wardell Honour MD

## 2015-05-29 ENCOUNTER — Other Ambulatory Visit: Payer: Self-pay | Admitting: *Deleted

## 2015-05-29 MED ORDER — ESCITALOPRAM OXALATE 20 MG PO TABS: ORAL_TABLET | ORAL | Status: DC

## 2015-06-01 ENCOUNTER — Ambulatory Visit (INDEPENDENT_AMBULATORY_CARE_PROVIDER_SITE_OTHER): Payer: Medicare PPO | Admitting: Family Medicine

## 2015-06-01 ENCOUNTER — Encounter: Payer: Self-pay | Admitting: Family Medicine

## 2015-06-01 VITALS — BP 116/76 | HR 80 | Temp 97.6°F | Ht 69.0 in | Wt 185.2 lb

## 2015-06-01 DIAGNOSIS — C44319 Basal cell carcinoma of skin of other parts of face: Secondary | ICD-10-CM | POA: Diagnosis not present

## 2015-06-01 DIAGNOSIS — C4490 Unspecified malignant neoplasm of skin, unspecified: Secondary | ICD-10-CM | POA: Diagnosis not present

## 2015-06-01 NOTE — Progress Notes (Signed)
Procedure note: Patient has an area on the left lower jaw that looks suspicious for skin cancer. Skin was prepped with alcohol and anesthetized with 1% lidocaine with epinephrine. Skin was then cleaned with Betadine and a 3 mm punch biopsy was used to extract a portion of the border and central crater of the lesion. There is no bleeding but Monsel solution was used as a precaution for further bleeding. Patient tolerated procedure well simple dressing with Band-Aid was applied. Specimen was labeled and will be sent to pathology for analysis  Wardell Honour MD

## 2015-06-01 NOTE — Addendum Note (Signed)
Addended by: Earlene Plater on: 06/01/2015 01:30 PM   Modules accepted: Orders

## 2015-06-05 LAB — PATHOLOGY

## 2015-06-06 ENCOUNTER — Telehealth: Payer: Self-pay | Admitting: Family Medicine

## 2015-06-06 ENCOUNTER — Other Ambulatory Visit: Payer: Self-pay | Admitting: *Deleted

## 2015-06-06 DIAGNOSIS — C4491 Basal cell carcinoma of skin, unspecified: Secondary | ICD-10-CM

## 2015-06-13 ENCOUNTER — Emergency Department (HOSPITAL_COMMUNITY)
Admission: EM | Admit: 2015-06-13 | Discharge: 2015-06-13 | Disposition: A | Discharge status: Home / Self Care | Payer: Medicare PPO | Attending: Emergency Medicine | Admitting: Emergency Medicine

## 2015-06-13 ENCOUNTER — Encounter (HOSPITAL_COMMUNITY): Payer: Self-pay | Admitting: Emergency Medicine

## 2015-06-13 DIAGNOSIS — Z79899 Other long term (current) drug therapy: Secondary | ICD-10-CM | POA: Insufficient documentation

## 2015-06-13 DIAGNOSIS — Z7982 Long term (current) use of aspirin: Secondary | ICD-10-CM | POA: Diagnosis not present

## 2015-06-13 DIAGNOSIS — Z720 Tobacco use: Secondary | ICD-10-CM | POA: Insufficient documentation

## 2015-06-13 DIAGNOSIS — Z85820 Personal history of malignant melanoma of skin: Secondary | ICD-10-CM | POA: Diagnosis not present

## 2015-06-13 DIAGNOSIS — F410 Panic disorder [episodic paroxysmal anxiety] without agoraphobia: Secondary | ICD-10-CM | POA: Diagnosis present

## 2015-06-13 DIAGNOSIS — F419 Anxiety disorder, unspecified: Secondary | ICD-10-CM | POA: Diagnosis not present

## 2015-06-13 DIAGNOSIS — E039 Hypothyroidism, unspecified: Secondary | ICD-10-CM | POA: Insufficient documentation

## 2015-06-13 DIAGNOSIS — I10 Essential (primary) hypertension: Secondary | ICD-10-CM | POA: Insufficient documentation

## 2015-06-13 DIAGNOSIS — I209 Angina pectoris, unspecified: Secondary | ICD-10-CM | POA: Insufficient documentation

## 2015-06-13 DIAGNOSIS — Z862 Personal history of diseases of the blood and blood-forming organs and certain disorders involving the immune mechanism: Secondary | ICD-10-CM | POA: Insufficient documentation

## 2015-06-13 DIAGNOSIS — K219 Gastro-esophageal reflux disease without esophagitis: Secondary | ICD-10-CM | POA: Diagnosis not present

## 2015-06-13 NOTE — ED Provider Notes (Signed)
CSN: [id number]     Arrival date & time 06/13/15  1732 History   First MD Initiated Contact with Patient 06/13/15 1806     Chief Complaint  Patient presents with  . Panic Attack   HPI  Peggy Smith is a 69 year old female with PMHx of anxiety, hypothyroidism and GERD presenting with anxiety. Patient reports she has a history of panic attacks but has not had one in over a year. She has Klonopin at home that she takes every morning. She reports that she was driving down the highway when she started thinking about her family dog that they have to put down this weekend and she became extremely anxious and upset. She had to pull over and he began having a panic attack. She reports sensation of her heart "beating out of her chest "and this made her more anxious which caused her to hyperventilate. She states this made her feel like her heart was in her throat and that she couldn't get a breath in. She states these symptoms are typical of her anxiety attacks. She took a half tablet of Klonopin and called her husband to bring her to the emergency department. She reports full resolution of her symptoms at presentation to the emergency department. She denies fevers, chills, diaphoresis, chest pain, shortness of breath, abdominal pain, nausea or vomiting.  Past Medical History  Diagnosis Date  . Hypothyroidism   . Anxiety   . Cardiac arrhythmia due to congenital heart disease   . Hepatitis      1976  . Hypertension   . PONV (postoperative nausea and vomiting)   . Angina     STRESS TEST NORMAL-ANXIETY  . Headache(784.0)     OTC MEDS  . Depression   . Hyperlipidemia     DOES NOT TAKE MEDS - HAS THEM AT HOME BUT STATES DOES NOT TAKE  . Panic attacks   . Iron deficiency anemia   . Melanoma ([place])   . GERD (gastroesophageal reflux disease)    Past Surgical History  Procedure Laterality Date  . Melanoma excision      Left Shoulder  . Tubal ligation    . Svd       X 4  . Upper gastrointestinal  endoscopy    . Colonoscopy    . Dilation and curettage of uterus    . Laparoscopic hysterectomy  11/08/2011    Procedure: HYSTERECTOMY TOTAL LAPAROSCOPIC;  Surgeon: Delice Lesch, MD;  Location: [facility];  Service: Gynecology;  Laterality: N/A;  with Bilateral Salpingo-Oophorectomies  . Cystoscopy  11/08/2011    Procedure: CYSTOSCOPY;  Surgeon: Delice Lesch, MD;  Location: [facility];  Service: Gynecology;  Laterality: N/A;   Family History  Problem Relation Age of Onset  . Dementia Mother   . Brain cancer Father   . Colon cancer Neg Hx    Social History  Substance Use Topics  . Smoking status: Current Every Day Smoker -- 0.25 packs/day for 20 years    Types: Cigarettes  . Smokeless tobacco: Never Used  . Alcohol Use: No   OB History    No data available     Review of Systems  Constitutional: Negative for fever, chills and diaphoresis.  Eyes: Negative for visual disturbance.  Respiratory: Negative for cough and shortness of breath.   Cardiovascular: Positive for palpitations. Negative for chest pain.  Gastrointestinal: Negative for nausea, vomiting and abdominal pain.  Musculoskeletal: Negative for myalgias and arthralgias.  Neurological: Negative for dizziness, syncope,  weakness and headaches.  Psychiatric/Behavioral: The patient is nervous/anxious.   All other systems reviewed and are negative.     Allergies  Crestor; Macrodantin; Sulfa antibiotics; and Codeine  Home Medications   Prior to Admission medications   Medication Sig Start Date End Date Taking? Authorizing Provider  amLODipine (NORVASC) 5 MG tablet TAKE 1 TABLET (5 MG TOTAL) BY MOUTH DAILY. 01/09/15  Yes Chipper Herb, MD  aspirin 81 MG tablet Take 81 mg by mouth every morning.    Yes Historical Provider, MD  calcium carbonate (OS-CAL) 600 MG TABS Take 600 mg by mouth every evening.    Yes Historical Provider, MD  Cholecalciferol (VITAMIN D-3) 5000 UNITS TABS Take 5,000 Units by mouth every evening.    Yes  Historical Provider, MD  clonazePAM (KLONOPIN) 0.5 MG tablet TAKE 1/2 TO 1 TABLET 2 TIMES A DAY Patient taking differently: Take 0.5 mg by mouth 2 (two) times daily as needed for anxiety.  05/15/15  Yes Wardell Honour, MD  escitalopram (LEXAPRO) 20 MG tablet TAKE 1 TABLET (20 MG TOTAL) BY MOUTH EVERY MORNING. 05/29/15  Yes Wardell Honour, MD  levothyroxine (SYNTHROID, LEVOTHROID) 75 MCG tablet TAKE 1 TABLET (75 MCG TOTAL) BY MOUTH DAILY. 11/30/14  Yes Wardell Honour, MD  magnesium 30 MG tablet Take 30 mg by mouth daily.   Yes Historical Provider, MD  Multiple Vitamins-Minerals (CENTRUM SILVER PO) Take 1 tablet by mouth every morning.    Yes Historical Provider, MD  omeprazole (PRILOSEC) 40 MG capsule TAKE 1 CAPSULE EVERY DAY Patient taking differently: TAKE 40 MG BY MOUTH ONCE DAILY 05/19/15  Yes Wardell Honour, MD  PATADAY 0.2 % SOLN Place 1 drop into both eyes daily as needed (dry eyes).  06/10/13  Yes Historical Provider, MD  Riboflavin (VITAMIN B-2 PO) Take 100 mg by mouth every morning.    Yes Historical Provider, MD   BP 107/60 mmHg  Pulse 70  Temp(Src) 98 F (36.7 C) (Oral)  Resp 18  SpO2 95% Physical Exam  Constitutional: She is oriented to person, place, and time. She appears well-developed and well-nourished. No distress.  HENT:  Head: Normocephalic and atraumatic.  Mouth/Throat: Oropharynx is clear and moist.  Eyes: Conjunctivae and EOM are normal. Pupils are equal, round, and reactive to light. Right eye exhibits no discharge. Left eye exhibits no discharge. No scleral icterus.  Neck: Normal range of motion.  Cardiovascular: Normal rate, regular rhythm, normal heart sounds and intact distal pulses.   Pulmonary/Chest: Effort normal and breath sounds normal. No respiratory distress. She has no wheezes. She has no rales.  Abdominal: Soft. She exhibits no distension. There is no tenderness.  Musculoskeletal: Normal range of motion. She exhibits no edema or tenderness.   Moves extremities spontaneously  Neurological: She is alert and oriented to person, place, and time. No cranial nerve deficit. Coordination normal.  CN 3-12 grossly intact. 5/5 strength in upper and lower extremities. Sensation to light touch intact  Skin: Skin is warm and dry.  Psychiatric: She has a normal mood and affect. Her behavior is normal.  Nursing note and vitals reviewed.   ED Course  Procedures (including critical care time) Labs Review Labs Reviewed - No data to display  Imaging Review No results found. I have personally reviewed and evaluated these images and lab results as part of my medical decision-making.   EKG Interpretation None      MDM   Final diagnoses:  Anxiety   Patient presenting after  a panic attack. Patient reports a long history of anxiety complicated by panic attacks. She states the symptoms she experienced today were typical of her panic attacks. She took a tablet of Klonopin at the time of the anxiety and her symptoms have resolved since presentation to the emergency department. In triage, patient's heart rate was noted to be 158 and her blood pressure 64/43. Patient's vitals were retaken immediately after and her pulse was found to be 85 and blood pressure 95/64. Patient denies dizziness, chest pain, shortness breath or any unusual symptoms that are not typical of her baseline. Monitored patient and noted blood pressure remained approximately 110/60 for the remainder of emergency Department stay. Patient with benign physical exam. Nonfocal neuro exam. Heart regular rate and rhythm. She admits to feeling anxious because they have to put her dog down this weekend. After full resolution of symptoms, she is appropriate for discharge with outpatient follow-up. Dr. Theotis Burrow has also seen and evaluated the patient and agrees with this plan. Stressed return precautions with patient and encouraged to make PCP follow-up appointment in the next couple of days.  Patient is stable for discharge.    Josephina Gip, PA-C 06/14/15 [place], MD 06/16/15 [phone]

## 2015-06-13 NOTE — ED Notes (Signed)
Pt states that she has a hx of panic attack.  States that is lasted an hour and a half.  Pt states that it is resolved.  Breathing easily.  No complaints at this time.

## 2015-06-13 NOTE — Discharge Instructions (Signed)
Peggy Smith follow up with your primary care doctor to discuss today's visit. Continue taking her Klonopin as directed. Return to the emergency department if any new, worsening or concerning symptoms.   Panic Attacks Panic attacks are sudden, short-livedsurges of severe anxiety, fear, or discomfort. They may occur for no reason when you are relaxed, when you are anxious, or when you are sleeping. Panic attacks may occur for a number of reasons:   Healthy people occasionally have panic attacks in extreme, life-threatening situations, such as war or natural disasters. Normal anxiety is a protective mechanism of the body that helps [place] react to danger (fight or flight response).  Panic attacks are often seen with anxiety disorders, such as panic disorder, social anxiety disorder, generalized anxiety disorder, and phobias. Anxiety disorders cause excessive or uncontrollable anxiety. They may interfere with your relationships or other life activities.  Panic attacks are sometimes seen with other mental illnesses, such as depression and posttraumatic stress disorder.  Certain medical conditions, prescription medicines, and drugs of abuse can cause panic attacks. SYMPTOMS  Panic attacks start suddenly, peak within 20 minutes, and are accompanied by four or more of the following symptoms:  Pounding heart or fast heart rate (palpitations).  Sweating.  Trembling or shaking.  Shortness of breath or feeling smothered.  Feeling choked.  Chest pain or discomfort.  Nausea or strange feeling in your stomach.  Dizziness, light-headedness, or feeling like you will faint.  Chills or hot flushes.  Numbness or tingling in your lips or hands and feet.  Feeling that things are not real or feeling that you are not yourself.  Fear of losing control or going crazy.  Fear of dying. Some of these symptoms can mimic serious medical conditions. For example, you may think you are having a heart attack. Although  panic attacks can be very scary, they are not life threatening. DIAGNOSIS  Panic attacks are diagnosed through an assessment by your health care provider. Your health care provider will ask questions about your symptoms, such as where and when they occurred. Your health care provider will also ask about your medical history and use of alcohol and drugs, including prescription medicines. Your health care provider may order blood tests or other studies to rule out a serious medical condition. Your health care provider may refer you to a mental health professional for further evaluation. TREATMENT   Most healthy people who have one or two panic attacks in an extreme, life-threatening situation will not require treatment.  The treatment for panic attacks associated with anxiety disorders or other mental illness typically involves counseling with a mental health professional, medicine, or a combination of both. Your health care provider will help determine what treatment is best for you.  Panic attacks due to physical illness usually go away with treatment of the illness. If prescription medicine is causing panic attacks, talk with your health care provider about stopping the medicine, decreasing the dose, or substituting another medicine.  Panic attacks due to alcohol or drug abuse go away with abstinence. Some adults need professional help in order to stop drinking or using drugs. HOME CARE INSTRUCTIONS   Take all medicines as directed by your health care provider.   Schedule and attend follow-up visits as directed by your health care provider. It is important to keep all your appointments. SEEK MEDICAL CARE IF:  You are not able to take your medicines as prescribed.  Your symptoms do not improve or get worse. SEEK IMMEDIATE MEDICAL CARE IF:  You experience panic attack symptoms that are different than your usual symptoms.  You have serious thoughts about hurting yourself or others.  You  are taking medicine for panic attacks and have a serious side effect. MAKE SURE YOU:  Understand these instructions.  Will watch your condition.  Will get help right away if you are not doing well or get worse.   This information is not intended to replace advice given to you by your health care provider. Make sure you discuss any questions you have with your health care provider.   Document Released: 07/22/2005 Document Revised: 07/27/2013 Document Reviewed: 03/05/2013 Elsevier Interactive Patient Education 2016 Elsevier Inc.  Generalized Anxiety Disorder Generalized anxiety disorder (GAD) is a mental disorder. It interferes with life functions, including relationships, work, and school. GAD is different from normal anxiety, which everyone experiences at some point in their lives in response to specific life events and activities. Normal anxiety actually helps [place] prepare for and get through these life events and activities. Normal anxiety goes away after the event or activity is over.  GAD causes anxiety that is not necessarily related to specific events or activities. It also causes excess anxiety in proportion to specific events or activities. The anxiety associated with GAD is also difficult to control. GAD can vary from mild to severe. People with severe GAD can have intense waves of anxiety with physical symptoms (panic attacks).  SYMPTOMS The anxiety and worry associated with GAD are difficult to control. This anxiety and worry are related to many life events and activities and also occur more days than not for 6 months or longer. People with GAD also have three or more of the following symptoms (one or more in children):  Restlessness.   Fatigue.  Difficulty concentrating.   Irritability.  Muscle tension.  Difficulty sleeping or unsatisfying sleep. DIAGNOSIS GAD is diagnosed through an assessment by your health care provider. Your health care provider will ask you  questions aboutyour mood,physical symptoms, and events in your life. Your health care provider may ask you about your medical history and use of alcohol or drugs, including prescription medicines. Your health care provider may also do a physical exam and blood tests. Certain medical conditions and the use of certain substances can cause symptoms similar to those associated with GAD. Your health care provider may refer you to a mental health specialist for further evaluation. TREATMENT The following therapies are usually used to treat GAD:   Medication. Antidepressant medication usually is prescribed for long-term daily control. Antianxiety medicines may be added in severe cases, especially when panic attacks occur.   Talk therapy (psychotherapy). Certain types of talk therapy can be helpful in treating GAD by providing support, education, and guidance. A form of talk therapy called cognitive behavioral therapy can teach you healthy ways to think about and react to daily life events and activities.  Stress managementtechniques. These include yoga, meditation, and exercise and can be very helpful when they are practiced regularly. A mental health specialist can help determine which treatment is best for you. Some people see improvement with one therapy. However, other people require a combination of therapies.   This information is not intended to replace advice given to you by your health care provider. Make sure you discuss any questions you have with your health care provider.   Document Released: 11/16/2012 Document Revised: 08/12/2014 Document Reviewed: 11/16/2012 Elsevier Interactive Patient Education [employer].

## 2015-06-13 NOTE — ED Notes (Signed)
Pt. States that panic attack was triggered by thinking about the dog that they are about to lose. She teared up when talking about the dog.Pt is stable at this time.

## 2015-07-03 ENCOUNTER — Other Ambulatory Visit: Payer: Self-pay | Admitting: Family Medicine

## 2015-07-03 DIAGNOSIS — C44310 Basal cell carcinoma of skin of unspecified parts of face: Secondary | ICD-10-CM | POA: Diagnosis not present

## 2015-07-03 DIAGNOSIS — D239 Other benign neoplasm of skin, unspecified: Secondary | ICD-10-CM | POA: Diagnosis not present

## 2015-08-13 ENCOUNTER — Other Ambulatory Visit: Payer: Self-pay | Admitting: Family Medicine

## 2015-08-21 ENCOUNTER — Telehealth: Payer: Self-pay | Admitting: Family Medicine

## 2015-08-21 NOTE — Telephone Encounter (Signed)
Stp and she states she was supposed to have a follow up appt with Dr. Denna Haggard but never heard anything back from them to schedule follow up appt. Advised pt she would need to call them and schedule appt with them as we only schedule the initial visit. Pt voiced understanding.

## 2015-08-30 ENCOUNTER — Other Ambulatory Visit: Payer: Self-pay

## 2015-08-30 MED ORDER — ESCITALOPRAM OXALATE 20 MG PO TABS: ORAL_TABLET | ORAL | Status: DC

## 2015-08-30 NOTE — Telephone Encounter (Signed)
Last seen 06/01/15  Dr Sabra Heck  Requesting 90 day supply

## 2015-08-31 NOTE — Telephone Encounter (Signed)
Left detailed message stating 90 day supply of rx was sent into pharmacy and to [facility] with any further questions or concerns.

## 2015-09-06 ENCOUNTER — Other Ambulatory Visit: Payer: Self-pay | Admitting: *Deleted

## 2015-09-06 MED ORDER — LEVOTHYROXINE SODIUM 75 MCG PO TABS: ORAL_TABLET | ORAL | Status: DC

## 2015-11-01 ENCOUNTER — Other Ambulatory Visit: Payer: Self-pay | Admitting: Family Medicine

## 2015-11-01 NOTE — Telephone Encounter (Signed)
Last seen 06/01/15 Dr Sabra Heck last thyroid 11/16/14

## 2015-11-12 ENCOUNTER — Other Ambulatory Visit: Payer: Self-pay | Admitting: Family Medicine

## 2015-11-27 ENCOUNTER — Other Ambulatory Visit: Payer: Self-pay | Admitting: *Deleted

## 2015-11-27 MED ORDER — ESCITALOPRAM OXALATE 20 MG PO TABS: ORAL_TABLET | ORAL | Status: DC

## 2015-11-29 ENCOUNTER — Other Ambulatory Visit: Payer: Self-pay | Admitting: Family Medicine

## 2015-11-30 NOTE — Telephone Encounter (Signed)
Last seen 06/01/15  Dr Sabra Heck  Last thyroid level 11/16/14

## 2015-12-01 ENCOUNTER — Ambulatory Visit (INDEPENDENT_AMBULATORY_CARE_PROVIDER_SITE_OTHER): Payer: Medicare PPO | Admitting: Family Medicine

## 2015-12-01 ENCOUNTER — Encounter: Payer: Self-pay | Admitting: Family Medicine

## 2015-12-01 DIAGNOSIS — E039 Hypothyroidism, unspecified: Secondary | ICD-10-CM

## 2015-12-01 DIAGNOSIS — E785 Hyperlipidemia, unspecified: Secondary | ICD-10-CM | POA: Diagnosis not present

## 2015-12-01 MED ORDER — ATORVASTATIN CALCIUM 20 MG PO TABS: 20.0000 mg | ORAL_TABLET | Freq: Every day | ORAL | Status: DC

## 2015-12-01 NOTE — Progress Notes (Signed)
Subjective:    Patient ID: Peggy Smith, female    DOB: 07/05/1946, 70 y.o.   MRN: [id number]  HPI 70 year old female here to follow-up hypothyroidism and basal cell skin cancer. There has been some frustration about getting definitive care on this basal cell skin cancer. She saw the dermatologist to monitor uses slides himself before he proceeded with definitive treatment. I spoke with his office today and seems that he is perhaps now at a point where he can go a head with removal.  Patient Active Problem List   Diagnosis Date Noted  . Need for Tdap vaccination 11/09/2013  . Iron deficiency anemia   . Melanoma ([place])   . GERD (gastroesophageal reflux disease)   . Seasonal allergic rhinitis 04/20/2013  . Pain in joint, ankle and foot 04/20/2013  . Panic attacks 01/19/2013  . Anxiety state, unspecified 01/19/2013  . HLD (hyperlipidemia) 01/19/2013  . Hypothyroidism 01/19/2013  . Chest pain 01/14/2011  . Dyspnea 01/14/2011  . Smoking 01/14/2011  . Elevated lipids 01/14/2011  . HTN (hypertension) 01/14/2011   Outpatient Encounter Prescriptions as of 12/01/2015  Medication Sig  . amLODipine (NORVASC) 5 MG tablet TAKE 1 TABLET (5 MG TOTAL) BY MOUTH DAILY.  Marland Kitchen aspirin 81 MG tablet Take 81 mg by mouth every morning.   . calcium carbonate (OS-CAL) 600 MG TABS Take 600 mg by mouth every evening.   . Cholecalciferol (VITAMIN D-3) 5000 UNITS TABS Take 5,000 Units by mouth every evening.   . clonazePAM (KLONOPIN) 0.5 MG tablet TAKE 1/2 TO 1 TABLET 2 TIMES A DAY (Patient taking differently: Take 0.5 mg by mouth 2 (two) times daily as needed for anxiety. )  . escitalopram (LEXAPRO) 20 MG tablet TAKE 1 TABLET (20 MG TOTAL) BY MOUTH EVERY MORNING.  Marland Kitchen levothyroxine (SYNTHROID, LEVOTHROID) 75 MCG tablet TAKE 1 TABLET (75 MCG TOTAL) BY MOUTH DAILY.  . magnesium 30 MG tablet Take 30 mg by mouth daily.  . Multiple Vitamins-Minerals (CENTRUM SILVER PO) Take 1 tablet by mouth every morning.   Marland Kitchen  omeprazole (PRILOSEC) 40 MG capsule TAKE 1 CAPSULE EVERY DAY  . Riboflavin (VITAMIN B-2 PO) Take 100 mg by mouth every morning.   . [DISCONTINUED] amLODipine (NORVASC) 5 MG tablet TAKE 1 TABLET (5 MG TOTAL) BY MOUTH DAILY.  . [DISCONTINUED] levothyroxine (SYNTHROID, LEVOTHROID) 75 MCG tablet TAKE 1 TABLET (75 MCG TOTAL) BY MOUTH DAILY.  . [DISCONTINUED] PATADAY 0.2 % SOLN Place 1 drop into both eyes daily as needed (dry eyes).    No facility-administered encounter medications on file as of 12/01/2015.      Review of Systems  Constitutional: Negative.   Respiratory: Negative.   Cardiovascular: Negative.   Neurological: Negative.   Psychiatric/Behavioral: Negative.        Objective:   Physical Exam  Constitutional: She is oriented to person, place, and time. She appears well-developed and well-nourished.  HENT:  Head: Normocephalic.  Right Ear: External ear normal.  Left Ear: External ear normal.  Cardiovascular: Normal rate and regular rhythm.   Pulmonary/Chest: Effort normal and breath sounds normal.  Neurological: She is alert and oriented to person, place, and time.  Psychiatric: She has a normal mood and affect. Her behavior is normal.          Assessment & Plan:  1. Hypothyroidism, unspecified hypothyroidism type Annual check of TSH to be done today. - TSH   2. HLD (hyperlipidemia) When her numbers are plugged into the cardiovascular risk assessment equation she has a  20.5% 10 year risk of cardiovascular event. Therefore she should be treated to lower her lipids. She is willing to try atorvastatin twice a week. Given its 96 hour half-life I think this may be effective and keep side effects to a minimum.  Wardell Honour MD

## 2015-12-02 LAB — TSH: TSH: 3.56 u[IU]/mL (ref 0.450–4.500)

## 2015-12-15 DIAGNOSIS — C44319 Basal cell carcinoma of skin of other parts of face: Secondary | ICD-10-CM | POA: Diagnosis not present

## 2015-12-30 ENCOUNTER — Other Ambulatory Visit: Payer: Self-pay | Admitting: Family Medicine

## 2016-01-10 ENCOUNTER — Other Ambulatory Visit: Payer: Self-pay | Admitting: Family Medicine

## 2016-01-11 MED ORDER — CLONAZEPAM 0.5 MG PO TABS: 0.5000 mg | ORAL_TABLET | Freq: Two times a day (BID) | ORAL | Status: DC | PRN

## 2016-01-11 NOTE — Telephone Encounter (Signed)
rx called into pharmacy

## 2016-01-11 NOTE — Telephone Encounter (Signed)
Last filled 05/15/15 with 1 refill. Last seen 12/01/15. Call in at [facility]

## 2016-02-10 ENCOUNTER — Other Ambulatory Visit: Payer: Self-pay | Admitting: Family Medicine

## 2016-02-26 ENCOUNTER — Other Ambulatory Visit: Payer: Self-pay

## 2016-02-27 ENCOUNTER — Other Ambulatory Visit: Payer: Self-pay | Admitting: Family Medicine

## 2016-02-27 MED ORDER — ESCITALOPRAM OXALATE 20 MG PO TABS: ORAL_TABLET | ORAL | 0 refills | Status: DC

## 2016-04-05 ENCOUNTER — Other Ambulatory Visit: Payer: Self-pay | Admitting: Family Medicine

## 2016-05-09 ENCOUNTER — Other Ambulatory Visit: Payer: Self-pay | Admitting: Family Medicine

## 2016-05-24 ENCOUNTER — Other Ambulatory Visit: Payer: Self-pay | Admitting: Family Medicine

## 2016-05-31 ENCOUNTER — Encounter: Payer: Self-pay | Admitting: Family Medicine

## 2016-05-31 ENCOUNTER — Ambulatory Visit (INDEPENDENT_AMBULATORY_CARE_PROVIDER_SITE_OTHER): Payer: Medicare PPO | Admitting: Family Medicine

## 2016-05-31 VITALS — BP 116/79 | HR 77 | Temp 98.4°F | Ht 69.0 in | Wt 197.1 lb

## 2016-05-31 DIAGNOSIS — I10 Essential (primary) hypertension: Secondary | ICD-10-CM

## 2016-05-31 DIAGNOSIS — E7800 Pure hypercholesterolemia, unspecified: Secondary | ICD-10-CM

## 2016-05-31 NOTE — Progress Notes (Signed)
Subjective:    Patient ID: Peggy Smith, female    DOB: August 21, 1945, 70 y.o.   MRN: [id number]  HPI   Patient is here today for followup of her chronic medical conditions including hypertension and hyperlipidemia. 70 year old person with several concerns. These were addressed I think to her satisfaction they were questions about medicines that she is on. She does have a rough patch of skin on her right shoulder that is to be examined. She was started back on atorvastatin back in April and we need to need to check her lipids today to assess that effect  She is also concerned about a lesion on her right shoulder.  Patient Active Problem List   Diagnosis Date Noted  . Need for Tdap vaccination 11/09/2013  . Iron deficiency anemia   . Melanoma ([place])   . GERD (gastroesophageal reflux disease)   . Seasonal allergic rhinitis 04/20/2013  . Pain in joint, ankle and foot 04/20/2013  . Panic attacks 01/19/2013  . Anxiety state, unspecified 01/19/2013  . HLD (hyperlipidemia) 01/19/2013  . Hypothyroidism 01/19/2013  . Chest pain 01/14/2011  . Dyspnea 01/14/2011  . Smoking 01/14/2011  . Elevated lipids 01/14/2011  . HTN (hypertension) 01/14/2011   Outpatient Encounter Prescriptions as of 05/31/2016  Medication Sig  . amLODipine (NORVASC) 5 MG tablet TAKE 1 TABLET (5 MG TOTAL) BY MOUTH DAILY.  Marland Kitchen aspirin 81 MG tablet Take 81 mg by mouth every morning.   Marland Kitchen atorvastatin (LIPITOR) 20 MG tablet Take 1 tablet (20 mg total) by mouth daily.  . calcium carbonate (OS-CAL) 600 MG TABS Take 600 mg by mouth every evening.   . Cholecalciferol (VITAMIN D-3) 5000 UNITS TABS Take 5,000 Units by mouth every evening.   . clonazePAM (KLONOPIN) 0.5 MG tablet Take 1 tablet (0.5 mg total) by mouth 2 (two) times daily as needed for anxiety.  Marland Kitchen escitalopram (LEXAPRO) 20 MG tablet TAKE 1 TABLET (20 MG TOTAL) BY MOUTH EVERY MORNING.  Marland Kitchen levothyroxine (SYNTHROID, LEVOTHROID) 75 MCG tablet TAKE 1 TABLET (75 MCG  TOTAL) BY MOUTH DAILY.  Marland Kitchen levothyroxine (SYNTHROID, LEVOTHROID) 75 MCG tablet TAKE 1 TABLET (75 MCG TOTAL) BY MOUTH DAILY.  . magnesium 30 MG tablet Take 30 mg by mouth daily.  . Multiple Vitamins-Minerals (CENTRUM SILVER PO) Take 1 tablet by mouth every morning.   Marland Kitchen omeprazole (PRILOSEC) 40 MG capsule TAKE 1 CAPSULE EVERY DAY  . Riboflavin (VITAMIN B-2 PO) Take 100 mg by mouth every morning.   . [DISCONTINUED] amLODipine (NORVASC) 5 MG tablet TAKE 1 TABLET (5 MG TOTAL) BY MOUTH DAILY.   No facility-administered encounter medications on file as of 05/31/2016.        Review of Systems  Constitutional: Negative.   HENT: Negative.   Eyes: Negative.   Respiratory: Negative.   Cardiovascular: Negative.   Gastrointestinal: Negative.   Endocrine: Negative.   Musculoskeletal: Negative.   Skin: Positive for rash.  Allergic/Immunologic: Negative.   Neurological: Negative.   Hematological: Negative.   Psychiatric/Behavioral: Negative.           Objective:   Physical Exam  Constitutional: She is oriented to person, place, and time. She appears well-developed and well-nourished.  HENT:  Mouth/Throat: Oropharynx is clear and moist.  Neck: Normal range of motion.  Cardiovascular: Normal rate, regular rhythm, normal heart sounds and intact distal pulses.   Pulmonary/Chest: Effort normal and breath sounds normal.  Abdominal: Soft.  Neurological: She is alert and oriented to person, place, and time.  Skin:  The "rough patch" on her right shoulder appears to be an actinic keratosis. I offered to freeze this with liquid nitrogen but we decided to just watch it for now.  Psychiatric: She has a normal mood and affect. Her behavior is normal.     BP 116/79   Pulse 77   Temp 98.4 F (36.9 C) (Oral)   Ht 5' 9" (1.753 m)   Wt 197 lb 2 oz (89.4 kg)   BMI 29.11 kg/m          Assessment & Plan:  1. Pure hypercholesterolemia We will check lipids today to assess efficacy of a  atorvastatin - CMP14+EGFR - Lipid panel  2. Essential hypertension Blood pressures are well controlled on amlodipine.  Wardell Honour MD

## 2016-05-31 NOTE — Patient Instructions (Signed)
Medicare Annual Wellness Visit  [place] and the medical providers at [facility] strive to bring you the best medical care.  In doing so we not only want to address your current medical conditions and concerns but also to detect new conditions early and prevent illness, disease and health-related problems.    Medicare offers a yearly Wellness Visit which allows our clinical staff to assess your need for preventative services including immunizations, lifestyle education, counseling to decrease risk of preventable diseases and screening for fall risk and other medical concerns.    This visit is provided free of charge (no copay) for all Medicare recipients. The clinical pharmacists at [facility] have begun to conduct these Wellness Visits which will also include a thorough review of all your medications.    As you primary medical provider recommend that you make an appointment for your Annual Wellness Visit if you have not done so already this year.  You may set up this appointment before you leave today or you may call back ([phone]) and schedule an appointment.  Please make sure when you call that you mention that you are scheduling your Annual Wellness Visit with the clinical pharmacist so that the appointment may be made for the proper length of time.     Continue current medications. Continue good therapeutic lifestyle changes which include good diet and exercise. Fall precautions discussed with patient. If an FOBT was given today- please return it to our front desk. If you are over 50 years old - you may need Prevnar 13 or the adult Pneumonia vaccine.  After your visit with us today you will receive a survey in the mail or online from [employer] regarding your care with us. Please take a moment to fill this out. Your feedback is very important to us as you can help us better understand your patient needs as well as improve  your experience and satisfaction. WE CARE ABOUT YOU!!!    

## 2016-06-01 LAB — CMP14+EGFR
ALBUMIN: 4.6 g/dL (ref 3.5–4.8)
ALK PHOS: 96 IU/L (ref 39–117)
ALT: 13 IU/L (ref 0–32)
AST: 20 IU/L (ref 0–40)
Albumin/Globulin Ratio: 1.6 (ref 1.2–2.2)
BUN / CREAT RATIO: 16 (ref 12–28)
BUN: 14 mg/dL (ref 8–27)
Bilirubin Total: 0.4 mg/dL (ref 0.0–1.2)
CO2: 26 mmol/L (ref 18–29)
CREATININE: 0.85 mg/dL (ref 0.57–1.00)
Calcium: 9.8 mg/dL (ref 8.7–10.3)
Chloride: 98 mmol/L (ref 96–106)
GFR calc Af Amer: 80 mL/min/{1.73_m2} (ref >59)
GFR, EST NON AFRICAN AMERICAN: 69 mL/min/{1.73_m2} (ref >59)
GLOBULIN, TOTAL: 2.9 g/dL (ref 1.5–4.5)
Glucose: 98 mg/dL (ref 65–99)
Potassium: 5 mmol/L (ref 3.5–5.2)
Sodium: 139 mmol/L (ref 134–144)
Total Protein: 7.5 g/dL (ref 6.0–8.5)

## 2016-06-01 LAB — LIPID PANEL
CHOL/HDL RATIO: 4.4 ratio (ref 0.0–4.4)
CHOLESTEROL TOTAL: 209 mg/dL — AB (ref 100–199)
HDL: 47 mg/dL (ref >39)
LDL CALC: 128 mg/dL — AB (ref 0–99)
Triglycerides: 168 mg/dL — ABNORMAL HIGH (ref 0–149)
VLDL Cholesterol Cal: 34 mg/dL (ref 5–40)

## 2016-06-23 ENCOUNTER — Other Ambulatory Visit: Payer: Self-pay | Admitting: Family Medicine

## 2016-07-01 ENCOUNTER — Other Ambulatory Visit: Payer: Self-pay | Admitting: Family Medicine

## 2016-08-06 ENCOUNTER — Other Ambulatory Visit: Payer: Self-pay | Admitting: Family Medicine

## 2016-08-28 ENCOUNTER — Other Ambulatory Visit: Payer: Self-pay | Admitting: Family Medicine

## 2016-09-04 ENCOUNTER — Other Ambulatory Visit: Payer: Self-pay

## 2016-09-04 MED ORDER — CLONAZEPAM 0.5 MG PO TABS: 0.5000 mg | ORAL_TABLET | Freq: Two times a day (BID) | ORAL | 1 refills | Status: DC | PRN

## 2016-09-05 NOTE — Telephone Encounter (Signed)
Refill called to [facility] 

## 2016-09-18 ENCOUNTER — Encounter: Payer: Self-pay | Admitting: *Deleted

## 2016-11-13 ENCOUNTER — Other Ambulatory Visit: Payer: Self-pay | Admitting: Family Medicine

## 2016-11-29 ENCOUNTER — Encounter: Payer: Self-pay | Admitting: Nurse Practitioner

## 2016-11-29 ENCOUNTER — Ambulatory Visit: Payer: Medicare PPO | Admitting: Family Medicine

## 2016-11-29 ENCOUNTER — Ambulatory Visit (INDEPENDENT_AMBULATORY_CARE_PROVIDER_SITE_OTHER): Payer: Medicare PPO | Admitting: Nurse Practitioner

## 2016-11-29 ENCOUNTER — Other Ambulatory Visit: Payer: Self-pay | Admitting: Family Medicine

## 2016-11-29 VITALS — BP 106/72 | HR 89 | Temp 97.3°F | Ht 69.0 in | Wt 200.0 lb

## 2016-11-29 DIAGNOSIS — E039 Hypothyroidism, unspecified: Secondary | ICD-10-CM

## 2016-11-29 DIAGNOSIS — E7800 Pure hypercholesterolemia, unspecified: Secondary | ICD-10-CM | POA: Diagnosis not present

## 2016-11-29 DIAGNOSIS — Z1159 Encounter for screening for other viral diseases: Secondary | ICD-10-CM | POA: Diagnosis not present

## 2016-11-29 DIAGNOSIS — F410 Panic disorder [episodic paroxysmal anxiety] without agoraphobia: Secondary | ICD-10-CM | POA: Diagnosis not present

## 2016-11-29 DIAGNOSIS — F411 Generalized anxiety disorder: Secondary | ICD-10-CM | POA: Diagnosis not present

## 2016-11-29 DIAGNOSIS — IMO0001 Reserved for inherently not codable concepts without codable children: Secondary | ICD-10-CM

## 2016-11-29 DIAGNOSIS — F17200 Nicotine dependence, unspecified, uncomplicated: Secondary | ICD-10-CM

## 2016-11-29 DIAGNOSIS — D509 Iron deficiency anemia, unspecified: Secondary | ICD-10-CM

## 2016-11-29 DIAGNOSIS — K219 Gastro-esophageal reflux disease without esophagitis: Secondary | ICD-10-CM | POA: Diagnosis not present

## 2016-11-29 DIAGNOSIS — J301 Allergic rhinitis due to pollen: Secondary | ICD-10-CM | POA: Diagnosis not present

## 2016-11-29 DIAGNOSIS — I10 Essential (primary) hypertension: Secondary | ICD-10-CM

## 2016-11-29 MED ORDER — ATORVASTATIN CALCIUM 20 MG PO TABS
20.0000 mg | ORAL_TABLET | Freq: Every day | ORAL | 5 refills | Status: DC
Start: 2016-11-29 — End: 2017-06-06

## 2016-11-29 MED ORDER — ESCITALOPRAM OXALATE 20 MG PO TABS: ORAL_TABLET | ORAL | 1 refills | Status: DC

## 2016-11-29 MED ORDER — OMEPRAZOLE 40 MG PO CPDR: 40.0000 mg | DELAYED_RELEASE_CAPSULE | Freq: Every day | ORAL | 1 refills | Status: DC

## 2016-11-29 MED ORDER — AMLODIPINE BESYLATE 5 MG PO TABS: ORAL_TABLET | ORAL | 1 refills | Status: DC

## 2016-11-29 MED ORDER — LEVOTHYROXINE SODIUM 75 MCG PO TABS: ORAL_TABLET | ORAL | 5 refills | Status: DC

## 2016-11-29 MED ORDER — CLONAZEPAM 0.5 MG PO TABS: 0.5000 mg | ORAL_TABLET | Freq: Two times a day (BID) | ORAL | 2 refills | Status: DC | PRN

## 2016-11-29 NOTE — Patient Instructions (Signed)
Stress and Stress Management Stress is a normal reaction to life events. It is what you feel when life demands more than you are used to or more than you can handle. Some stress can be useful. For example, the stress reaction can help you catch the last bus of the day, study for a test, or meet a deadline at work. But stress that occurs too often or for too long can cause problems. It can affect your emotional health and interfere with relationships and normal daily activities. Too much stress can weaken your immune system and increase your risk for physical illness. If you already have a medical problem, stress can make it worse. What are the causes? All sorts of life events may cause stress. An event that causes stress for one person may not be stressful for another person. Major life events commonly cause stress. These may be positive or negative. Examples include losing your job, moving into a new home, getting married, having a baby, or losing a loved one. Less obvious life events may also cause stress, especially if they occur day after day or in combination. Examples include working long hours, driving in traffic, caring for children, being in debt, or being in a difficult relationship. What are the signs or symptoms? Stress may cause emotional symptoms including, the following:  Anxiety. This is feeling worried, afraid, on edge, overwhelmed, or out of control.  Anger. This is feeling irritated or impatient.  Depression. This is feeling sad, down, helpless, or guilty.  Difficulty focusing, remembering, or making decisions. Stress may cause physical symptoms, including the following:  Aches and pains. These may affect your head, neck, back, stomach, or other areas of your body.  Tight muscles or clenched jaw.  Low energy or trouble sleeping. Stress may cause unhealthy behaviors, including the following:  Eating to feel better (overeating) or skipping meals.  Sleeping too little, too  much, or both.  Working too much or putting off tasks (procrastination).  Smoking, drinking alcohol, or using drugs to feel better. How is this diagnosed? Stress is diagnosed through an assessment by your health care provider. Your health care provider will ask questions about your symptoms and any stressful life events.Your health care provider will also ask about your medical history and may order blood tests or other tests. Certain medical conditions and medicine can cause physical symptoms similar to stress. Mental illness can cause emotional symptoms and unhealthy behaviors similar to stress. Your health care provider may refer you to a mental health professional for further evaluation. How is this treated? Stress management is the recommended treatment for stress.The goals of stress management are reducing stressful life events and coping with stress in healthy ways. Techniques for reducing stressful life events include the following:  Stress identification. Self-monitor for stress and identify what causes stress for you. These skills may help you to avoid some stressful events.  Time management. Set your priorities, keep a calendar of events, and learn to say "no." These tools can help you avoid making too many commitments. Techniques for coping with stress include the following:  Rethinking the problem. Try to think realistically about stressful events rather than ignoring them or overreacting. Try to find the positives in a stressful situation rather than focusing on the negatives.  Exercise. Physical exercise can release both physical and emotional tension. The key is to find a form of exercise you enjoy and do it regularly.  Relaxation techniques. These relax the body and mind. Examples include yoga,  meditation, tai chi, biofeedback, deep breathing, progressive muscle relaxation, listening to music, being out in nature, journaling, and other hobbies. Again, the key is to find one or  more that you enjoy and can do regularly.  Healthy lifestyle. Eat a balanced diet, get plenty of sleep, and do not smoke. Avoid using alcohol or drugs to relax.  Strong support network. Spend time with family, friends, or other people you enjoy being around.Express your feelings and talk things over with someone you trust. Counseling or talktherapy with a mental health professional may be helpful if you are having difficulty managing stress on your own. Medicine is typically not recommended for the treatment of stress.Talk to your health care provider if you think you need medicine for symptoms of stress. Follow these instructions at home:  Keep all follow-up visits as directed by your health care provider.  Take all medicines as directed by your health care provider. Contact a health care provider if:  Your symptoms get worse or you start having new symptoms.  You feel overwhelmed by your problems and can no longer manage them on your own. Get help right away if:  You feel like hurting yourself or someone else. This information is not intended to replace advice given to you by your health care provider. Make sure you discuss any questions you have with your health care provider. Document Released: 01/15/2001 Document Revised: 12/28/2015 Document Reviewed: 03/16/2013 Elsevier Interactive Patient Education  2017 [employer].

## 2016-11-29 NOTE — Progress Notes (Signed)
Subjective:    Patient ID: Peggy Smith, female    DOB: 06-16-1946, 71 y.o.   MRN: [id number]  HPI   Peggy Smith is here today for follow up of chronic medical problem.  Outpatient Encounter Prescriptions as of 11/29/2016  Medication Sig  . amLODipine (NORVASC) 5 MG tablet TAKE 1 TABLET (5 MG TOTAL) BY MOUTH DAILY.  Marland Kitchen aspirin 81 MG tablet Take 81 mg by mouth every morning.   Marland Kitchen atorvastatin (LIPITOR) 20 MG tablet TAKE 1 TABLET (20 MG TOTAL) BY MOUTH DAILY.  . calcium carbonate (OS-CAL) 600 MG TABS Take 600 mg by mouth every evening.   . Cholecalciferol (VITAMIN D-3) 5000 UNITS TABS Take 5,000 Units by mouth every evening.   . clonazePAM (KLONOPIN) 0.5 MG tablet Take 1 tablet (0.5 mg total) by mouth 2 (two) times daily as needed for anxiety.  Marland Kitchen escitalopram (LEXAPRO) 20 MG tablet TAKE 1 TABLET (20 MG TOTAL) BY MOUTH EVERY MORNING.  Marland Kitchen levothyroxine (SYNTHROID, LEVOTHROID) 75 MCG tablet TAKE 1 TABLET (75 MCG TOTAL) BY MOUTH DAILY.  . magnesium 30 MG tablet Take 30 mg by mouth daily.  . Multiple Vitamins-Minerals (CENTRUM SILVER PO) Take 1 tablet by mouth every morning.   Marland Kitchen omeprazole (PRILOSEC) 40 MG capsule TAKE 1 CAPSULE EVERY DAY  . Riboflavin (VITAMIN B-2 PO) Take 100 mg by mouth every morning.     1. Essential hypertension  No c/o chestpain,SOB or HA- does not check blood pressure at home  2. Seasonal allergic rhinitis due to pollen   currently not taking anything- says allergies are not bothering her right now  3. Gastroesophageal reflux disease without esophagitis  Omeprazole- works well to keep symptoms under control  4. Hypothyroidism, unspecified type   no problems that she is aware of.  5. Anxiety state   patient takes 1/2 klonopin every morning and takes lexapro at night. sheis doing much better - use to have bad anxiety issues. GAD 7 : Generalized Anxiety Score 11/29/2016  Nervous, Anxious, on Edge 1  Control/stop worrying 0  Worry too much - different things 0   Trouble relaxing 0  Restless 1  Easily annoyed or irritable 0  Afraid - awful might happen 0  Total GAD 7 Score 2  Anxiety Difficulty Not difficult at all     6. Pure hypercholesterolemia  Watches diet all the time  7. Iron deficiency anemia, unspecified iron deficiency anemia type  No c/o fatigue  8. Panic attacks  Uses stress management  9. Smoking  Smokes up to 10 cigarettes a day- no desire to quit    New complaints: None today    Review of Systems  Constitutional: Negative for diaphoresis.  Eyes: Negative for pain.  Respiratory: Negative for shortness of breath.   Cardiovascular: Negative for chest pain, palpitations and leg swelling.  Gastrointestinal: Negative for abdominal pain.  Endocrine: Negative for polydipsia.  Skin: Negative for rash.  Neurological: Negative for dizziness, weakness and headaches.  Hematological: Does not bruise/bleed easily.       Objective:   Physical Exam  Constitutional: She is oriented to person, place, and time. She appears well-developed and well-nourished.  HENT:  Nose: Nose normal.  Mouth/Throat: Oropharynx is clear and moist.  Eyes: EOM are normal.  Neck: Trachea normal, normal range of motion and full passive range of motion without pain. Neck supple. No JVD present. Carotid bruit is not present. No thyromegaly present.  Cardiovascular: Normal rate, regular rhythm, normal heart sounds and intact  distal pulses.  Exam reveals no gallop and no friction rub.   No murmur heard. Pulmonary/Chest: Effort normal and breath sounds normal.  Abdominal: Soft. Bowel sounds are normal. She exhibits no distension and no mass. There is no tenderness.  Musculoskeletal: Normal range of motion.  Lymphadenopathy:    She has no cervical adenopathy.  Neurological: She is alert and oriented to person, place, and time. She has normal reflexes.  Skin: Skin is warm and dry.  Psychiatric: She has a normal mood and affect. Her behavior is normal.  Judgment and thought content normal.   BP 106/72   Pulse 89   Temp 97.3 F (36.3 C) (Oral)   Ht 5' 9"  (1.753 m)   Wt 200 lb (90.7 kg)   BMI 29.53 kg/m       Assessment & Plan:  1. Essential hypertension Low sodium diet - CMP14+EGFR - amLODipine (NORVASC) 5 MG tablet; TAKE 1 TABLET (5 MG TOTAL) BY MOUTH DAILY.  Dispense: 90 tablet; Refill: 1  2. Seasonal allergic rhinitis due to pollen claritin or zyrtec OTC  3. Gastroesophageal reflux disease without esophagitis Avoid spicy foods Do not eat 2 hours prior to bedtime - omeprazole (PRILOSEC) 40 MG capsule; Take 1 capsule (40 mg total) by mouth daily.  Dispense: 90 capsule; Refill: 1  4. Hypothyroidism, unspecified type - Thyroid Panel With TSH - levothyroxine (SYNTHROID, LEVOTHROID) 75 MCG tablet; TAKE 1 TABLET (75 MCG TOTAL) BY MOUTH DAILY.  Dispense: 30 tablet; Refill: 5  5. Anxiety state Stress management - clonazePAM (KLONOPIN) 0.5 MG tablet; Take 1 tablet (0.5 mg total) by mouth 2 (two) times daily as needed for anxiety.  Dispense: 60 tablet; Refill: 2  6. Pure hypercholesterolemia Low fat diet - Lipid panel - atorvastatin (LIPITOR) 20 MG tablet; Take 1 tablet (20 mg total) by mouth daily.  Dispense: 30 tablet; Refill: 5  7. Iron deficiency anemia, unspecified iron deficiency anemia type Labs pending  8. Panic attacks Stress management - escitalopram (LEXAPRO) 20 MG tablet; TAKE 1 TABLET (20 MG TOTAL) BY MOUTH EVERY MORNING.  Dispense: 90 tablet; Refill: 1  9. Smoking Smoking cessation encouraged  10. Need for hepatitis C screening test - Hepatitis C antibody    Labs pending Health maintenance reviewed Diet and exercise encouraged Continue all meds Follow up  In 6 months   [place], FNP

## 2016-11-30 LAB — CBC WITH DIFFERENTIAL/PLATELET
BASOS: 1 %
Basophils Absolute: 0.1 10*3/uL (ref 0.0–0.2)
EOS (ABSOLUTE): 0.2 10*3/uL (ref 0.0–0.4)
EOS: 3 %
HEMATOCRIT: 39.9 % (ref 34.0–46.6)
Hemoglobin: 13.3 g/dL (ref 11.1–15.9)
IMMATURE GRANS (ABS): 0 10*3/uL (ref 0.0–0.1)
IMMATURE GRANULOCYTES: 0 %
LYMPHS: 27 %
Lymphocytes Absolute: 2 10*3/uL (ref 0.7–3.1)
MCH: 28.7 pg (ref 26.6–33.0)
MCHC: 33.3 g/dL (ref 31.5–35.7)
MCV: 86 fL (ref 79–97)
MONOCYTES: 9 %
MONOS ABS: 0.7 10*3/uL (ref 0.1–0.9)
NEUTROS PCT: 60 %
Neutrophils Absolute: 4.6 10*3/uL (ref 1.4–7.0)
Platelets: 411 10*3/uL — ABNORMAL HIGH (ref 150–379)
RBC: 4.64 x10E6/uL (ref 3.77–5.28)
RDW: 14.1 % (ref 12.3–15.4)
WBC: 7.6 10*3/uL (ref 3.4–10.8)

## 2016-11-30 LAB — CMP14+EGFR
ALT: 13 IU/L (ref 0–32)
AST: 17 IU/L (ref 0–40)
Albumin/Globulin Ratio: 1.7 (ref 1.2–2.2)
Albumin: 4.4 g/dL (ref 3.5–4.8)
Alkaline Phosphatase: 94 IU/L (ref 39–117)
BUN/Creatinine Ratio: 16 (ref 12–28)
BUN: 16 mg/dL (ref 8–27)
Bilirubin Total: 0.3 mg/dL (ref 0.0–1.2)
CALCIUM: 9.7 mg/dL (ref 8.7–10.3)
CO2: 27 mmol/L (ref 18–29)
Chloride: 98 mmol/L (ref 96–106)
Creatinine, Ser: 0.98 mg/dL (ref 0.57–1.00)
GFR, EST AFRICAN AMERICAN: 67 mL/min/{1.73_m2} (ref >59)
GFR, EST NON AFRICAN AMERICAN: 58 mL/min/{1.73_m2} — AB (ref >59)
GLUCOSE: 102 mg/dL — AB (ref 65–99)
Globulin, Total: 2.6 g/dL (ref 1.5–4.5)
Potassium: 4.9 mmol/L (ref 3.5–5.2)
Sodium: 139 mmol/L (ref 134–144)
Total Protein: 7 g/dL (ref 6.0–8.5)

## 2016-11-30 LAB — THYROID PANEL WITH TSH
Free Thyroxine Index: 1.9 (ref 1.2–4.9)
T3 UPTAKE RATIO: 24 % (ref 24–39)
T4, Total: 7.9 ug/dL (ref 4.5–12.0)
TSH: 4 u[IU]/mL (ref 0.450–4.500)

## 2016-11-30 LAB — LIPID PANEL
CHOL/HDL RATIO: 4.4 ratio (ref 0.0–4.4)
CHOLESTEROL TOTAL: 200 mg/dL — AB (ref 100–199)
HDL: 45 mg/dL (ref >39)
LDL CALC: 131 mg/dL — AB (ref 0–99)
TRIGLYCERIDES: 122 mg/dL (ref 0–149)
VLDL CHOLESTEROL CAL: 24 mg/dL (ref 5–40)

## 2016-11-30 LAB — HEPATITIS C ANTIBODY: Hep C Virus Ab: <0.1 s/co ratio (ref 0.0–0.9)

## 2016-12-28 ENCOUNTER — Other Ambulatory Visit: Payer: Self-pay | Admitting: Family Medicine

## 2017-03-31 ENCOUNTER — Other Ambulatory Visit: Payer: Self-pay | Admitting: *Deleted

## 2017-03-31 DIAGNOSIS — E039 Hypothyroidism, unspecified: Secondary | ICD-10-CM

## 2017-03-31 MED ORDER — LEVOTHYROXINE SODIUM 75 MCG PO TABS: ORAL_TABLET | ORAL | 2 refills | Status: DC

## 2017-05-23 ENCOUNTER — Other Ambulatory Visit: Payer: Self-pay

## 2017-05-23 DIAGNOSIS — K219 Gastro-esophageal reflux disease without esophagitis: Secondary | ICD-10-CM

## 2017-05-23 DIAGNOSIS — F410 Panic disorder [episodic paroxysmal anxiety] without agoraphobia: Secondary | ICD-10-CM

## 2017-05-23 MED ORDER — OMEPRAZOLE 40 MG PO CPDR: 40.0000 mg | DELAYED_RELEASE_CAPSULE | Freq: Every day | ORAL | 0 refills | Status: DC

## 2017-05-23 MED ORDER — ESCITALOPRAM OXALATE 20 MG PO TABS: ORAL_TABLET | ORAL | 0 refills | Status: DC

## 2017-06-02 ENCOUNTER — Encounter: Payer: Self-pay | Admitting: Nurse Practitioner

## 2017-06-02 ENCOUNTER — Ambulatory Visit (INDEPENDENT_AMBULATORY_CARE_PROVIDER_SITE_OTHER): Payer: Medicare PPO

## 2017-06-02 ENCOUNTER — Ambulatory Visit (INDEPENDENT_AMBULATORY_CARE_PROVIDER_SITE_OTHER): Payer: Medicare PPO | Admitting: Nurse Practitioner

## 2017-06-02 VITALS — BP 134/80 | HR 92 | Temp 96.9°F | Ht 69.0 in | Wt 203.0 lb

## 2017-06-02 DIAGNOSIS — R1010 Upper abdominal pain, unspecified: Secondary | ICD-10-CM | POA: Diagnosis not present

## 2017-06-02 NOTE — Patient Instructions (Signed)
Cholelithiasis Cholelithiasis is also called "gallstones." It is a kind of gallbladder disease. The gallbladder is an organ that stores a liquid (bile) that helps you digest fat. Gallstones may not cause symptoms (may be silent gallstones) until they cause a blockage, and then they can cause pain (gallbladder attack). Follow these instructions at home:  Take over-the-counter and prescription medicines only as told by your doctor.  Stay at a healthy weight.  Eat healthy foods. This includes: ? Eating fewer fatty foods, like fried foods. ? Eating fewer refined carbs (refined carbohydrates). Refined carbs are breads and grains that are highly processed, like white bread and white rice. Instead, choose whole grains like whole-wheat bread and brown rice. ? Eating more fiber. Almonds, fresh fruit, and beans are healthy sources of fiber.  Keep all follow-up visits as told by your doctor. This is important. Contact a doctor if:  You have sudden pain in the upper right side of your belly (abdomen). Pain might spread to your right shoulder or your chest. This may be a sign of a gallbladder attack.  You feel sick to your stomach (are nauseous).  You throw up (vomit).  You have been diagnosed with gallstones that have no symptoms and you get: ? Belly pain. ? Discomfort, burning, or fullness in the upper part of your belly (indigestion). Get help right away if:  You have sudden pain in the upper right side of your belly, and it lasts for more than 2 hours.  You have belly pain that lasts for more than 5 hours.  You have a fever or chills.  You keep feeling sick to your stomach or you keep throwing up.  Your skin or the whites of your eyes turn yellow (jaundice).  You have dark-colored pee (urine).  You have light-colored poop (stool). Summary  Cholelithiasis is also called "gallstones."  The gallbladder is an organ that stores a liquid (bile) that helps you digest fat.  Silent  gallstones are gallstones that do not cause symptoms.  A gallbladder attack may cause sudden pain in the upper right side of your belly. Pain might spread to your right shoulder or your chest. If this happens, contact your doctor.  If you have sudden pain in the upper right side of your belly that lasts for more than 2 hours, get help right away. This information is not intended to replace advice given to you by your health care provider. Make sure you discuss any questions you have with your health care provider. Document Released: 01/08/2008 Document Revised: 04/07/2016 Document Reviewed: 04/07/2016 Elsevier Interactive Patient Education  2017 [employer]  

## 2017-06-02 NOTE — Progress Notes (Addendum)
   Subjective:    Patient ID: Peggy Smith, female    DOB: 14-Mar-1946, 71 y.o.   MRN: [id number]  HPI Patient is brought in by her husband with her c/o waking up in the middle of night with abdominal pain. She says anytime she hurts she gets anxious which usually makes things worse. She had a slight bowel movement this morning which helped some. She usually goes everyday.     Review of Systems  Constitutional: Negative for chills and fever.  HENT: Negative.   Respiratory: Negative.   Cardiovascular: Negative.   Gastrointestinal: Positive for abdominal pain and nausea.  Genitourinary: Negative.   Neurological: Negative.   Psychiatric/Behavioral: Negative.   All other systems reviewed and are negative.      Objective:   Physical Exam  Constitutional: She is oriented to person, place, and time. She appears well-developed. No distress.  Cardiovascular: Normal rate and regular rhythm.   Pulmonary/Chest: Effort normal and breath sounds normal.  Abdominal: Soft. Bowel sounds are normal. She exhibits no mass. There is no tenderness. There is no rebound and no guarding.  Neurological: She is alert and oriented to person, place, and time.  Skin: Skin is warm.  Psychiatric: She has a normal mood and affect. Her behavior is normal. Judgment and thought content normal.    BP 134/80   Pulse 92   Temp (!) 96.9 F (36.1 C) (Oral)   Ht 5\' 9"  (1.753 m)   Wt 203 lb (92.1 kg)   BMI 29.98 kg/m   kub- possible early ileus in distal small bowel. 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

## 2017-06-06 ENCOUNTER — Ambulatory Visit (INDEPENDENT_AMBULATORY_CARE_PROVIDER_SITE_OTHER): Payer: Medicare PPO | Admitting: Nurse Practitioner

## 2017-06-06 ENCOUNTER — Encounter: Payer: Self-pay | Admitting: Nurse Practitioner

## 2017-06-06 VITALS — BP 122/75 | HR 78 | Temp 96.8°F | Ht 69.0 in | Wt 201.0 lb

## 2017-06-06 DIAGNOSIS — I10 Essential (primary) hypertension: Secondary | ICD-10-CM

## 2017-06-06 DIAGNOSIS — Z13820 Encounter for screening for osteoporosis: Secondary | ICD-10-CM

## 2017-06-06 DIAGNOSIS — F411 Generalized anxiety disorder: Secondary | ICD-10-CM | POA: Diagnosis not present

## 2017-06-06 DIAGNOSIS — K219 Gastro-esophageal reflux disease without esophagitis: Secondary | ICD-10-CM

## 2017-06-06 DIAGNOSIS — E7800 Pure hypercholesterolemia, unspecified: Secondary | ICD-10-CM | POA: Diagnosis not present

## 2017-06-06 DIAGNOSIS — D509 Iron deficiency anemia, unspecified: Secondary | ICD-10-CM | POA: Diagnosis not present

## 2017-06-06 DIAGNOSIS — E039 Hypothyroidism, unspecified: Secondary | ICD-10-CM | POA: Diagnosis not present

## 2017-06-06 DIAGNOSIS — F410 Panic disorder [episodic paroxysmal anxiety] without agoraphobia: Secondary | ICD-10-CM

## 2017-06-06 DIAGNOSIS — J301 Allergic rhinitis due to pollen: Secondary | ICD-10-CM | POA: Diagnosis not present

## 2017-06-06 DIAGNOSIS — C439 Malignant melanoma of skin, unspecified: Secondary | ICD-10-CM

## 2017-06-06 DIAGNOSIS — Z1231 Encounter for screening mammogram for malignant neoplasm of breast: Secondary | ICD-10-CM

## 2017-06-06 DIAGNOSIS — F17200 Nicotine dependence, unspecified, uncomplicated: Secondary | ICD-10-CM | POA: Diagnosis not present

## 2017-06-06 MED ORDER — ESCITALOPRAM OXALATE 20 MG PO TABS: ORAL_TABLET | ORAL | 0 refills | Status: DC

## 2017-06-06 MED ORDER — LEVOTHYROXINE SODIUM 75 MCG PO TABS: ORAL_TABLET | ORAL | 2 refills | Status: DC

## 2017-06-06 MED ORDER — CETIRIZINE HCL 10 MG PO TABS: 10.0000 mg | ORAL_TABLET | Freq: Every day | ORAL | 5 refills | Status: DC

## 2017-06-06 MED ORDER — FLUTICASONE PROPIONATE 50 MCG/ACT NA SUSP: 2.0000 | Freq: Every day | NASAL | 6 refills | Status: DC

## 2017-06-06 MED ORDER — CLONAZEPAM 0.5 MG PO TABS: 0.5000 mg | ORAL_TABLET | Freq: Two times a day (BID) | ORAL | 2 refills | Status: DC | PRN

## 2017-06-06 MED ORDER — ATORVASTATIN CALCIUM 20 MG PO TABS: 20.0000 mg | ORAL_TABLET | Freq: Every day | ORAL | 5 refills | Status: DC

## 2017-06-06 MED ORDER — AMLODIPINE BESYLATE 5 MG PO TABS: ORAL_TABLET | ORAL | 1 refills | Status: DC

## 2017-06-06 MED ORDER — OMEPRAZOLE 40 MG PO CPDR: 40.0000 mg | DELAYED_RELEASE_CAPSULE | Freq: Every day | ORAL | 0 refills | Status: DC

## 2017-06-06 NOTE — Patient Instructions (Signed)
Steps to Quit Smoking Smoking tobacco can be bad for your health. It can also affect almost every organ in your body. Smoking puts you and people around you at risk for many serious long-lasting (chronic) diseases. Quitting smoking is hard, but it is one of the best things that you can do for your health. It is never too late to quit. What are the benefits of quitting smoking? When you quit smoking, you lower your risk for getting serious diseases and conditions. They can include:  Lung cancer or lung disease.  Heart disease.  Stroke.  Heart attack.  Not being able to have children (infertility).  Weak bones (osteoporosis) and broken bones (fractures).  If you have coughing, wheezing, and shortness of breath, those symptoms may get better when you quit. You may also get sick less often. If you are pregnant, quitting smoking can help to lower your chances of having a baby of low birth weight. What can I do to help me quit smoking? Talk with your doctor about what can help you quit smoking. Some things you can do (strategies) include:  Quitting smoking totally, instead of slowly cutting back how much you smoke over a period of time.  Going to in-person counseling. You are more likely to quit if you go to many counseling sessions.  Using resources and support systems, such as: ? Online chats with a counselor. ? Phone quitlines. ? Printed self-help materials. ? Support groups or group counseling. ? Text messaging programs. ? Mobile phone apps or applications.  Taking medicines. Some of these medicines may have nicotine in them. If you are pregnant or breastfeeding, do not take any medicines to quit smoking unless your doctor says it is okay. Talk with your doctor about counseling or other things that can help you.  Talk with your doctor about using more than one strategy at the same time, such as taking medicines while you are also going to in-person counseling. This can help make  quitting easier. What things can I do to make it easier to quit? Quitting smoking might feel very hard at first, but there is a lot that you can do to make it easier. Take these steps:  Talk to your family and friends. Ask them to support and encourage you.  Call phone quitlines, reach out to support groups, or work with a counselor.  Ask people who smoke to not smoke around you.  Avoid places that make you want (trigger) to smoke, such as: ? Bars. ? Parties. ? Smoke-break areas at work.  Spend time with people who do not smoke.  Lower the stress in your life. Stress can make you want to smoke. Try these things to help your stress: ? Getting regular exercise. ? Deep-breathing exercises. ? Yoga. ? Meditating. ? Doing a body scan. To do this, close your eyes, focus on one area of your body at a time from head to toe, and notice which parts of your body are tense. Try to relax the muscles in those areas.  Download or buy apps on your mobile phone or tablet that can help you stick to your quit plan. There are many free apps, such as QuitGuide from the CDC (Centers for Disease Control and Prevention). You can find more support from smokefree.gov and other websites.  This information is not intended to replace advice given to you by your health care provider. Make sure you discuss any questions you have with your health care provider. Document Released: 05/18/2009 Document   Revised: 03/19/2016 Document Reviewed: 12/06/2014 Elsevier Interactive Patient Education  2018 Elsevier Inc.  

## 2017-06-06 NOTE — Progress Notes (Signed)
Subjective:    Patient ID: Peggy Smith, female    DOB: [birth year], 71 y.o.   MRN: [id number]  HPI Peggy Smith is here today for follow up of chronic medical problem.  Outpatient Encounter Prescriptions as of 06/06/2017  Medication Sig  . amLODipine (NORVASC) 5 MG tablet TAKE 1 TABLET (5 MG TOTAL) BY MOUTH DAILY.  Marland Kitchen aspirin 81 MG tablet Take 81 mg by mouth every morning.   Marland Kitchen atorvastatin (LIPITOR) 20 MG tablet Take 1 tablet (20 mg total) by mouth daily. (Patient not taking: Reported on 06/02/2017)  . calcium carbonate (OS-CAL) 600 MG TABS Take 600 mg by mouth every evening.   . cetirizine (ZYRTEC) 10 MG tablet Take 10 mg by mouth daily.  . Cholecalciferol (VITAMIN D-3) 5000 UNITS TABS Take 5,000 Units by mouth every evening.   . clonazePAM (KLONOPIN) 0.5 MG tablet Take 1 tablet (0.5 mg total) by mouth 2 (two) times daily as needed for anxiety.  Marland Kitchen escitalopram (LEXAPRO) 20 MG tablet TAKE 1 TABLET (20 MG TOTAL) BY MOUTH EVERY MORNING.  Marland Kitchen levothyroxine (SYNTHROID, LEVOTHROID) 75 MCG tablet TAKE 1 TABLET (75 MCG TOTAL) BY MOUTH DAILY.  . magnesium 30 MG tablet Take 30 mg by mouth daily.  . Multiple Vitamins-Minerals (CENTRUM SILVER PO) Take 1 tablet by mouth every morning.   Marland Kitchen omeprazole (PRILOSEC) 40 MG capsule Take 1 capsule (40 mg total) by mouth daily.  . Riboflavin (VITAMIN B-2 PO) Take 100 mg by mouth every morning.    No facility-administered encounter medications on file as of 06/06/2017.     1. Essential hypertension  No c/o chest pain, sob or headache. She does not check blood pressures at home BP Readings from Last 3 Encounters:  06/02/17 134/80  11/29/16 106/72  05/31/16 116/79     2. Gastroesophageal reflux disease without esophagitis  She takes  Omeprazole daily- had stopped taking for awhile but had to start back due to symptom flare up  3. Hypothyroidism, unspecified type  No problems that she is aware of  4. Panic attacks  Has often- she has always been  a nervous person  5. Iron deficiency anemia, unspecified iron deficiency anemia type  Time to have labs drawn  6. Pure hypercholesterolemia  Does not really watch diet. Stopped statin about 2 weeks ago- was only taking 2x a week anyway.  7. Anxiety state  Pretty much stays anxious  8. Malignant melanoma, unspecified site [facility])  Has not seen oncology in several years- no reoccurrence that she is awareof  9. Smoking  Says she stays to anxious to stop taking    New complaints: -Patient was seen 0n 06/02/17 with abdominal pain during the night- she had mild stool burden and large gallstone. She was told to avoid spicy and fatty foods and we would reevaluate it today -eye has been crust the last several mornings. 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  HENT: Negative.   Eyes: Negative for pain.  Respiratory: Negative for shortness of breath.   Cardiovascular: Negative for chest pain, palpitations and leg swelling.  Gastrointestinal: Negative for abdominal pain.  Endocrine: Negative for polydipsia.  Genitourinary: Negative.   Skin: Negative for rash.  Neurological: Negative for dizziness, weakness and headaches.  Hematological: Does not bruise/bleed easily.  Psychiatric/Behavioral: The patient is  nervous/anxious.   All other systems reviewed and are negative.      Objective:   Physical Exam  Constitutional: She is oriented to person, place, and time. She appears well-developed and well-nourished.  HENT:  Nose: Nose normal.  Mouth/Throat: Oropharynx is clear and moist.  Eyes: Pupils are equal, round, and reactive to light. Conjunctivae and EOM are normal.  Clear crusty exudate  Right inner canthus  Neck: Trachea normal, normal range of motion and full passive range of motion without pain.  Neck supple. No JVD present. Carotid bruit is not present. No thyromegaly present.  Cardiovascular: Normal rate, regular rhythm, normal heart sounds and intact distal pulses.  Exam reveals no gallop and no friction rub.   No murmur heard. Pulmonary/Chest: Effort normal and breath sounds normal.  Abdominal: Soft. Bowel sounds are normal. She exhibits no distension and no mass. There is no tenderness.  Musculoskeletal: Normal range of motion.  Lymphadenopathy:    She has no cervical adenopathy.  Neurological: She is alert and oriented to person, place, and time. She has normal reflexes.  Skin: Skin is warm and dry.  Annular dry patchy area on left lower abdominal wall.  Psychiatric: She has a normal mood and affect. Her behavior is normal. Judgment and thought content normal.   BP 122/75   Pulse 78   Temp (!) 96.8 F (36 C) (Oral)   Ht 5' 9"  (1.753 m)   Wt 201 lb (91.2 kg)   BMI 29.68 kg/m       Assessment & Plan:  1. Essential hypertension Low sodium diet - amLODipine (NORVASC) 5 MG tablet; TAKE 1 TABLET (5 MG TOTAL) BY MOUTH DAILY.  Dispense: 90 tablet; Refill: 1 - CMP14+EGFR  2. Gastroesophageal reflux disease without esophagitis Avoid spicy foods Do not eat 2 hours prior to bedtime - omeprazole (PRILOSEC) 40 MG capsule; Take 1 capsule (40 mg total) by mouth daily.  Dispense: 90 capsule; Refill: 0  3. Hypothyroidism, unspecified type - levothyroxine (SYNTHROID, LEVOTHROID) 75 MCG tablet; TAKE 1 TABLET (75 MCG TOTAL) BY MOUTH DAILY.  Dispense: 90 tablet; Refill: 2 - Thyroid Panel With TSH  4. Panic attacks Stress management - escitalopram (LEXAPRO) 20 MG tablet; TAKE 1 TABLET (20 MG TOTAL) BY MOUTH EVERY MORNING.  Dispense: 90 tablet; Refill: 0  5. Iron deficiency anemia, unspecified iron deficiency anemia type Labs pending  6. Pure hypercholesterolemia Watch fats in diet - atorvastatin (LIPITOR) 20 MG tablet; Take 1 tablet (20 mg total) by mouth daily.  Dispense: 30  tablet; Refill: 5 - Lipid panel  7. Anxiety state Stress management encouraged - clonazePAM (KLONOPIN) 0.5 MG tablet; Take 1 tablet (0.5 mg total) by mouth 2 (two) times daily as needed for anxiety.  Dispense: 60 tablet; Refill: 2  8. Malignant melanoma, unspecified site [facility]) To oncology if notice any reoccurence  9. Smoking Smoking cessation encouraged  10. Seasonal allergic rhinitis due to pollen Added flonse - fluticasone (FLONASE) 50 MCG/ACT nasal spray; Place 2 sprays into both nostrils daily.  Dispense: 16 g; Refill: 6 - cetirizine (ZYRTEC) 10 MG tablet; Take 1 tablet (10 mg total) by mouth daily.  Dispense: 30 tablet; Refill: 5    Labs pending Health maintenance reviewed Diet and exercise encouraged Continue all meds Follow up  In 6 months   [place], FNP

## 2017-06-07 LAB — CBC WITH DIFFERENTIAL/PLATELET
BASOS: 1 %
Basophils Absolute: 0.1 10*3/uL (ref 0.0–0.2)
EOS (ABSOLUTE): 0.2 10*3/uL (ref 0.0–0.4)
EOS: 3 %
HEMATOCRIT: 38.3 % (ref 34.0–46.6)
Hemoglobin: 12.8 g/dL (ref 11.1–15.9)
Immature Grans (Abs): 0 10*3/uL (ref 0.0–0.1)
Immature Granulocytes: 1 %
LYMPHS ABS: 2.4 10*3/uL (ref 0.7–3.1)
Lymphs: 29 %
MCH: 28.4 pg (ref 26.6–33.0)
MCHC: 33.4 g/dL (ref 31.5–35.7)
MCV: 85 fL (ref 79–97)
MONOS ABS: 0.6 10*3/uL (ref 0.1–0.9)
Monocytes: 7 %
NEUTROS ABS: 4.8 10*3/uL (ref 1.4–7.0)
Neutrophils: 59 %
Platelets: 460 10*3/uL — ABNORMAL HIGH (ref 150–379)
RBC: 4.5 x10E6/uL (ref 3.77–5.28)
RDW: 14.1 % (ref 12.3–15.4)
WBC: 8.2 10*3/uL (ref 3.4–10.8)

## 2017-06-07 LAB — LIPID PANEL
CHOL/HDL RATIO: 6 ratio — AB (ref 0.0–4.4)
CHOLESTEROL TOTAL: 244 mg/dL — AB (ref 100–199)
HDL: 41 mg/dL (ref >39)
LDL CALC: 176 mg/dL — AB (ref 0–99)
Triglycerides: 133 mg/dL (ref 0–149)
VLDL Cholesterol Cal: 27 mg/dL (ref 5–40)

## 2017-06-07 LAB — CMP14+EGFR
ALBUMIN: 4.5 g/dL (ref 3.5–4.8)
ALK PHOS: 87 IU/L (ref 39–117)
ALT: 16 IU/L (ref 0–32)
AST: 17 IU/L (ref 0–40)
Albumin/Globulin Ratio: 1.7 (ref 1.2–2.2)
BUN / CREAT RATIO: 13 (ref 12–28)
BUN: 13 mg/dL (ref 8–27)
Bilirubin Total: 0.2 mg/dL (ref 0.0–1.2)
CO2: 25 mmol/L (ref 20–29)
CREATININE: 1.02 mg/dL — AB (ref 0.57–1.00)
Calcium: 9.8 mg/dL (ref 8.7–10.3)
Chloride: 99 mmol/L (ref 96–106)
GFR calc Af Amer: 64 mL/min/{1.73_m2} (ref >59)
GFR, EST NON AFRICAN AMERICAN: 55 mL/min/{1.73_m2} — AB (ref >59)
GLOBULIN, TOTAL: 2.7 g/dL (ref 1.5–4.5)
Glucose: 100 mg/dL — ABNORMAL HIGH (ref 65–99)
Potassium: 5 mmol/L (ref 3.5–5.2)
SODIUM: 137 mmol/L (ref 134–144)
Total Protein: 7.2 g/dL (ref 6.0–8.5)

## 2017-06-07 LAB — THYROID PANEL WITH TSH
Free Thyroxine Index: 1.7 (ref 1.2–4.9)
T3 Uptake Ratio: 25 % (ref 24–39)
T4, Total: 6.6 ug/dL (ref 4.5–12.0)
TSH: 5.47 u[IU]/mL — AB (ref 0.450–4.500)

## 2017-06-09 ENCOUNTER — Other Ambulatory Visit: Payer: Self-pay | Admitting: Nurse Practitioner

## 2017-06-09 ENCOUNTER — Ambulatory Visit (INDEPENDENT_AMBULATORY_CARE_PROVIDER_SITE_OTHER): Payer: Medicare PPO

## 2017-06-09 DIAGNOSIS — Z780 Asymptomatic menopausal state: Secondary | ICD-10-CM | POA: Diagnosis not present

## 2017-06-09 DIAGNOSIS — M85851 Other specified disorders of bone density and structure, right thigh: Secondary | ICD-10-CM | POA: Diagnosis not present

## 2017-06-09 MED ORDER — LEVOTHYROXINE SODIUM 88 MCG PO TABS: 88.0000 ug | ORAL_TABLET | Freq: Every day | ORAL | 11 refills | Status: DC

## 2017-06-24 ENCOUNTER — Encounter: Payer: Self-pay | Admitting: *Deleted

## 2017-06-25 ENCOUNTER — Other Ambulatory Visit: Payer: Self-pay

## 2017-06-25 MED ORDER — LEVOTHYROXINE SODIUM 88 MCG PO TABS: 88.0000 ug | ORAL_TABLET | Freq: Every day | ORAL | 3 refills | Status: DC

## 2017-07-08 ENCOUNTER — Ambulatory Visit
Admission: RE | Admit: 2017-07-08 | Discharge: 2017-07-08 | Disposition: A | Discharge status: Home / Self Care | Payer: Medicare PPO | Source: Ambulatory Visit | Attending: Nurse Practitioner | Admitting: Nurse Practitioner

## 2017-07-08 DIAGNOSIS — Z1231 Encounter for screening mammogram for malignant neoplasm of breast: Secondary | ICD-10-CM | POA: Diagnosis not present

## 2017-11-29 ENCOUNTER — Other Ambulatory Visit: Payer: Self-pay | Admitting: Nurse Practitioner

## 2017-11-29 DIAGNOSIS — F410 Panic disorder [episodic paroxysmal anxiety] without agoraphobia: Secondary | ICD-10-CM

## 2017-11-29 DIAGNOSIS — K219 Gastro-esophageal reflux disease without esophagitis: Secondary | ICD-10-CM

## 2017-12-01 NOTE — Telephone Encounter (Signed)
OV 12/05/17

## 2017-12-05 ENCOUNTER — Encounter: Payer: Self-pay | Admitting: Nurse Practitioner

## 2017-12-05 ENCOUNTER — Ambulatory Visit: Payer: Medicare PPO | Admitting: Nurse Practitioner

## 2017-12-05 VITALS — BP 118/72 | HR 83 | Temp 97.2°F | Ht 69.0 in | Wt 197.0 lb

## 2017-12-05 DIAGNOSIS — F410 Panic disorder [episodic paroxysmal anxiety] without agoraphobia: Secondary | ICD-10-CM

## 2017-12-05 DIAGNOSIS — E039 Hypothyroidism, unspecified: Secondary | ICD-10-CM

## 2017-12-05 DIAGNOSIS — E7800 Pure hypercholesterolemia, unspecified: Secondary | ICD-10-CM

## 2017-12-05 DIAGNOSIS — F17200 Nicotine dependence, unspecified, uncomplicated: Secondary | ICD-10-CM | POA: Diagnosis not present

## 2017-12-05 DIAGNOSIS — J301 Allergic rhinitis due to pollen: Secondary | ICD-10-CM | POA: Diagnosis not present

## 2017-12-05 DIAGNOSIS — I10 Essential (primary) hypertension: Secondary | ICD-10-CM | POA: Diagnosis not present

## 2017-12-05 DIAGNOSIS — F411 Generalized anxiety disorder: Secondary | ICD-10-CM | POA: Diagnosis not present

## 2017-12-05 DIAGNOSIS — K219 Gastro-esophageal reflux disease without esophagitis: Secondary | ICD-10-CM | POA: Diagnosis not present

## 2017-12-05 DIAGNOSIS — D509 Iron deficiency anemia, unspecified: Secondary | ICD-10-CM

## 2017-12-05 DIAGNOSIS — R6889 Other general symptoms and signs: Secondary | ICD-10-CM | POA: Diagnosis not present

## 2017-12-05 MED ORDER — LEVOTHYROXINE SODIUM 88 MCG PO TABS: 88.0000 ug | ORAL_TABLET | Freq: Every day | ORAL | 1 refills | Status: DC

## 2017-12-05 MED ORDER — CLONAZEPAM 0.5 MG PO TABS: 0.5000 mg | ORAL_TABLET | Freq: Two times a day (BID) | ORAL | 2 refills | Status: DC | PRN

## 2017-12-05 MED ORDER — OMEPRAZOLE 40 MG PO CPDR: DELAYED_RELEASE_CAPSULE | ORAL | 1 refills | Status: DC

## 2017-12-05 MED ORDER — ESCITALOPRAM OXALATE 20 MG PO TABS
ORAL_TABLET | ORAL | 1 refills | Status: DC
Start: 2017-12-05 — End: 2018-06-19

## 2017-12-05 MED ORDER — AMLODIPINE BESYLATE 5 MG PO TABS: ORAL_TABLET | ORAL | 1 refills | Status: DC

## 2017-12-05 MED ORDER — ATORVASTATIN CALCIUM 20 MG PO TABS: 20.0000 mg | ORAL_TABLET | Freq: Every day | ORAL | 1 refills | Status: DC

## 2017-12-05 NOTE — Patient Instructions (Signed)

## 2017-12-05 NOTE — Progress Notes (Signed)
Subjective:    Patient ID: Peggy Smith, female    DOB: [birth year], 72 y.o.   MRN: [id number]   Chief Complaint: Medical Management of Chronic Issues   HPI:  1. Essential hypertension  No c/o chest pain, sob or headaches. Does not check blood pressure at home. BP Readings from Last 3 Encounters:  12/05/17 118/72  06/06/17 122/75  06/02/17 134/80     2. Seasonal allergic rhinitis due to pollen  Using flonase and that alone works great for her.  3. Gastroesophageal reflux disease without esophagitis  Takes omeprazole daily which helps keep symptoms under control  4. Hypothyroidism, unspecified type  No problems that aware  5. Smoking  Smokes almost a pack a day, but says she only smokes a 1/2 cigarette when she does smoke  6. Panic attacks  Has often, at least 2-3 times a week.  7. Iron deficiency anemia, unspecified iron deficiency anemia type  She says that she does like to sleep alot  8. Pure hypercholesterolemia  Does not watch diet or exercise  9. Anxiety state  Is very anxious all the time GAD 7 : Generalized Anxiety Score 12/05/2017 11/29/2016  Nervous, Anxious, on Edge 2 1  Control/stop worrying 3 0  Worry too much - different things 3 0  Trouble relaxing 2 0  Restless 1 1  Easily annoyed or irritable 0 0  Afraid - awful might happen 2 0  Total GAD 7 Score 13 2  Anxiety Difficulty Somewhat difficult Not difficult at all        Outpatient Encounter Medications as of 12/05/2017  Medication Sig  . amLODipine (NORVASC) 5 MG tablet TAKE 1 TABLET (5 MG TOTAL) BY MOUTH DAILY.  Marland Kitchen aspirin 81 MG tablet Take 81 mg by mouth every morning.   Marland Kitchen atorvastatin (LIPITOR) 20 MG tablet Take 1 tablet (20 mg total) by mouth daily.  . calcium carbonate (OS-CAL) 600 MG TABS Take 600 mg by mouth every evening.   . Cholecalciferol (VITAMIN D-3) 5000 UNITS TABS Take 5,000 Units by mouth every evening.   . clonazePAM (KLONOPIN) 0.5 MG tablet Take 1 tablet (0.5 mg total) by mouth 2  (two) times daily as needed for anxiety.  Marland Kitchen escitalopram (LEXAPRO) 20 MG tablet TAKE 1 TABLET BY MOUTH EVERY DAY IN THE MORNING  . fluticasone (FLONASE) 50 MCG/ACT nasal spray Place 2 sprays into both nostrils daily.  Marland Kitchen levothyroxine (SYNTHROID) 88 MCG tablet Take 1 tablet (88 mcg total) by mouth daily.  . magnesium 30 MG tablet Take 30 mg by mouth daily.  . Multiple Vitamins-Minerals (CENTRUM SILVER PO) Take 1 tablet by mouth every morning.   Marland Kitchen omeprazole (PRILOSEC) 40 MG capsule TAKE 1 CAPSULE BY MOUTH EVERY DAY  . Riboflavin (VITAMIN B-2 PO) Take 100 mg by mouth every morning.        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  HENT: Negative.   Eyes: Negative for pain.  Respiratory: Negative for shortness of breath.   Cardiovascular: Negative for chest pain, palpitations and leg swelling.  Gastrointestinal: Negative for abdominal pain.  Endocrine: Negative for polydipsia.  Genitourinary: Negative.   Skin: Negative for rash.  Neurological: Negative for dizziness, weakness and headaches.  Hematological: Does not bruise/bleed easily.  Psychiatric/Behavioral: Negative.   All other systems reviewed and are negative.      Objective:  Physical Exam  Constitutional: She is oriented to person, place, and time. She appears well-developed and well-nourished. No distress.  HENT:  Head: Normocephalic.  Nose: Nose normal.  Mouth/Throat: Oropharynx is clear and moist.  Eyes: Pupils are equal, round, and reactive to light. EOM are normal.  Neck: Normal range of motion. Neck supple. No JVD present. Carotid bruit is not present.  Cardiovascular: Normal rate, regular rhythm, normal heart sounds and intact distal pulses.  Pulmonary/Chest: Effort normal and breath sounds normal. No respiratory distress. She has no wheezes. She has no rales. She exhibits  no tenderness.  Abdominal: Soft. Normal appearance, normal aorta and bowel sounds are normal. She exhibits no distension, no abdominal bruit, no pulsatile midline mass and no mass. There is no splenomegaly or hepatomegaly. There is no tenderness.  Musculoskeletal: Normal range of motion. She exhibits no edema.  Lymphadenopathy:    She has no cervical adenopathy.  Neurological: She is alert and oriented to person, place, and time. She has normal reflexes.  Skin: Skin is warm and dry.  Psychiatric: She has a normal mood and affect. Her behavior is normal. Judgment and thought content normal.    BP 118/72   Pulse 83   Temp (!) 97.2 F (36.2 C) (Oral)   Ht 5' 9" (1.753 m)   Wt 197 lb (89.4 kg)   BMI 29.09 kg/m        Assessment & Plan:  CEIRRA BELLI comes in today with chief complaint of Medical Management of Chronic Issues   Diagnosis and orders addressed:  1. Essential hypertension Low sodium diet - CMP14+EGFR - amLODipine (NORVASC) 5 MG tablet; TAKE 1 TABLET (5 MG TOTAL) BY MOUTH DAILY.  Dispense: 90 tablet; Refill: 1  2. Seasonal allergic rhinitis due to pollen Continue flonase  3. Gastroesophageal reflux disease without esophagitis Avoid spicy foods Do not eat 2 hours prior to bedtime - omeprazole (PRILOSEC) 40 MG capsule; TAKE 1 CAPSULE BY MOUTH EVERY DAY  Dispense: 90 capsule; Refill: 1  4. Hypothyroidism, unspecified type - Thyroid Panel With TSH - levothyroxine (SYNTHROID) 88 MCG tablet; Take 1 tablet (88 mcg total) by mouth daily.  Dispense: 90 tablet; Refill: 1  5. Smoking Smoking cessation encouraged  6. Panic attacks Stress management - escitalopram (LEXAPRO) 20 MG tablet; TAKE 1 TABLET BY MOUTH EVERY DAY IN THE MORNING  Dispense: 90 tablet; Refill: 1  7. Iron deficiency anemia, unspecified iron deficiency anemia type Labs pending  8. Pure hypercholesterolemia Low fat diet - Lipid panel - atorvastatin (LIPITOR) 20 MG tablet; Take 1 tablet (20 mg  total) by mouth daily.  Dispense: 90 tablet; Refill: 1  9. Anxiety state Stress management - clonazePAM (KLONOPIN) 0.5 MG tablet; Take 1 tablet (0.5 mg total) by mouth 2 (two) times daily as needed for anxiety.  Dispense: 60 tablet; Refill: 2   Labs pending Health Maintenance reviewed Diet and exercise encouraged  Follow up plan: 6 months   [name] Hassell Done, FNP

## 2017-12-06 LAB — ANEMIA PROFILE B
BASOS: 1 %
Basophils Absolute: 0.1 10*3/uL (ref 0.0–0.2)
EOS (ABSOLUTE): 0.2 10*3/uL (ref 0.0–0.4)
EOS: 3 %
FERRITIN: 24 ng/mL (ref 15–150)
Folate: >20 ng/mL (ref >3.0)
HEMATOCRIT: 39.8 % (ref 34.0–46.6)
HEMOGLOBIN: 12.9 g/dL (ref 11.1–15.9)
IMMATURE GRANS (ABS): 0 10*3/uL (ref 0.0–0.1)
Immature Granulocytes: 0 %
Iron Saturation: 36 % (ref 15–55)
Iron: 123 ug/dL (ref 27–139)
LYMPHS: 30 %
Lymphocytes Absolute: 2.5 10*3/uL (ref 0.7–3.1)
MCH: 27.8 pg (ref 26.6–33.0)
MCHC: 32.4 g/dL (ref 31.5–35.7)
MCV: 86 fL (ref 79–97)
MONOS ABS: 0.6 10*3/uL (ref 0.1–0.9)
Monocytes: 7 %
NEUTROS ABS: 5 10*3/uL (ref 1.4–7.0)
Neutrophils: 59 %
Platelets: 433 10*3/uL — ABNORMAL HIGH (ref 150–379)
RBC: 4.64 x10E6/uL (ref 3.77–5.28)
RDW: 14.2 % (ref 12.3–15.4)
RETIC CT PCT: 1.5 % (ref 0.6–2.6)
Total Iron Binding Capacity: 344 ug/dL (ref 250–450)
UIBC: 221 ug/dL (ref 118–369)
Vitamin B-12: 430 pg/mL (ref 232–1245)
WBC: 8.4 10*3/uL (ref 3.4–10.8)

## 2017-12-06 LAB — CMP14+EGFR
ALK PHOS: 93 IU/L (ref 39–117)
ALT: 14 IU/L (ref 0–32)
AST: 16 IU/L (ref 0–40)
Albumin/Globulin Ratio: 1.8 (ref 1.2–2.2)
Albumin: 4.6 g/dL (ref 3.5–4.8)
BUN/Creatinine Ratio: 17 (ref 12–28)
BUN: 17 mg/dL (ref 8–27)
Bilirubin Total: 0.4 mg/dL (ref 0.0–1.2)
CHLORIDE: 101 mmol/L (ref 96–106)
CO2: 26 mmol/L (ref 20–29)
CREATININE: 1 mg/dL (ref 0.57–1.00)
Calcium: 9.9 mg/dL (ref 8.7–10.3)
GFR calc Af Amer: 65 mL/min/{1.73_m2} (ref >59)
GFR calc non Af Amer: 56 mL/min/{1.73_m2} — ABNORMAL LOW (ref >59)
GLOBULIN, TOTAL: 2.6 g/dL (ref 1.5–4.5)
Glucose: 101 mg/dL — ABNORMAL HIGH (ref 65–99)
Potassium: 5 mmol/L (ref 3.5–5.2)
SODIUM: 141 mmol/L (ref 134–144)
Total Protein: 7.2 g/dL (ref 6.0–8.5)

## 2017-12-06 LAB — THYROID PANEL WITH TSH
Free Thyroxine Index: 2 (ref 1.2–4.9)
T3 UPTAKE RATIO: 26 % (ref 24–39)
T4, Total: 7.5 ug/dL (ref 4.5–12.0)
TSH: 1.72 u[IU]/mL (ref 0.450–4.500)

## 2017-12-06 LAB — LIPID PANEL
CHOLESTEROL TOTAL: 181 mg/dL (ref 100–199)
Chol/HDL Ratio: 4.3 ratio (ref 0.0–4.4)
HDL: 42 mg/dL (ref >39)
LDL Calculated: 108 mg/dL — ABNORMAL HIGH (ref 0–99)
TRIGLYCERIDES: 155 mg/dL — AB (ref 0–149)
VLDL Cholesterol Cal: 31 mg/dL (ref 5–40)

## 2017-12-11 ENCOUNTER — Encounter: Payer: Self-pay | Admitting: Nurse Practitioner

## 2018-01-29 ENCOUNTER — Other Ambulatory Visit: Payer: Self-pay | Admitting: Nurse Practitioner

## 2018-01-29 DIAGNOSIS — J301 Allergic rhinitis due to pollen: Secondary | ICD-10-CM

## 2018-05-08 ENCOUNTER — Ambulatory Visit: Payer: Medicare PPO | Admitting: *Deleted

## 2018-05-25 ENCOUNTER — Ambulatory Visit: Payer: Medicare PPO | Admitting: *Deleted

## 2018-06-15 ENCOUNTER — Ambulatory Visit: Payer: Medicare PPO | Admitting: *Deleted

## 2018-06-19 ENCOUNTER — Encounter: Payer: Self-pay | Admitting: Nurse Practitioner

## 2018-06-19 ENCOUNTER — Ambulatory Visit: Payer: Medicare PPO | Admitting: Nurse Practitioner

## 2018-06-19 VITALS — BP 101/69 | HR 90 | Temp 97.4°F | Ht 68.75 in | Wt 202.0 lb

## 2018-06-19 DIAGNOSIS — E039 Hypothyroidism, unspecified: Secondary | ICD-10-CM

## 2018-06-19 DIAGNOSIS — K219 Gastro-esophageal reflux disease without esophagitis: Secondary | ICD-10-CM | POA: Diagnosis not present

## 2018-06-19 DIAGNOSIS — E7800 Pure hypercholesterolemia, unspecified: Secondary | ICD-10-CM

## 2018-06-19 DIAGNOSIS — D509 Iron deficiency anemia, unspecified: Secondary | ICD-10-CM

## 2018-06-19 DIAGNOSIS — F17200 Nicotine dependence, unspecified, uncomplicated: Secondary | ICD-10-CM

## 2018-06-19 DIAGNOSIS — F411 Generalized anxiety disorder: Secondary | ICD-10-CM

## 2018-06-19 DIAGNOSIS — F410 Panic disorder [episodic paroxysmal anxiety] without agoraphobia: Secondary | ICD-10-CM

## 2018-06-19 DIAGNOSIS — I10 Essential (primary) hypertension: Secondary | ICD-10-CM | POA: Diagnosis not present

## 2018-06-19 MED ORDER — CLONAZEPAM 0.5 MG PO TABS: 0.5000 mg | ORAL_TABLET | Freq: Two times a day (BID) | ORAL | 5 refills | Status: DC | PRN

## 2018-06-19 MED ORDER — AMLODIPINE BESYLATE 5 MG PO TABS: ORAL_TABLET | ORAL | 1 refills | Status: DC

## 2018-06-19 MED ORDER — ATORVASTATIN CALCIUM 20 MG PO TABS: 20.0000 mg | ORAL_TABLET | Freq: Every day | ORAL | 1 refills | Status: DC

## 2018-06-19 MED ORDER — LEVOTHYROXINE SODIUM 88 MCG PO TABS: 88.0000 ug | ORAL_TABLET | Freq: Every day | ORAL | 1 refills | Status: DC

## 2018-06-19 MED ORDER — ESCITALOPRAM OXALATE 20 MG PO TABS
ORAL_TABLET | ORAL | 1 refills | Status: DC
Start: 2018-06-19 — End: 2018-12-18

## 2018-06-19 MED ORDER — OMEPRAZOLE 40 MG PO CPDR
DELAYED_RELEASE_CAPSULE | ORAL | 1 refills | Status: DC
Start: 2018-06-19 — End: 2018-10-12

## 2018-06-19 NOTE — Progress Notes (Signed)
Subjective:    Patient ID: Peggy Smith, female    DOB: 1946-05-20, 72 y.o.   MRN: [id number]   Chief Complaint: Medical Management of Chronic Issues   HPI:  1. Essential hypertension  No c/o chest pain, sob or headache. Does not check blood pressure at home. BP Readings from Last 3 Encounters:  06/19/18 101/69  12/05/17 118/72  06/06/17 122/75     2. Gastroesophageal reflux disease without esophagitis  Takes omeprazole daily and works well.  3. Hypothyroidism, unspecified type  No problems that she is aware of.  4. Smoking  Smokes over a pack a day  5. Panic attacks  She is currently on klonopin 1/2 tablet daily- this usually prevents panic attack form occurring.  6. Anxiety state  Takes a daily lexapro which she says has really helped calm her.  7. Pure hypercholesterolemia  Is on lipitor 3 x a week. And she says her hips hurt  8. Iron deficiency anemia, unspecified iron deficiency anemia type  Need to have hgb checked today. Last hgb was 12.9.    Outpatient Encounter Medications as of 06/19/2018  Medication Sig  . amLODipine (NORVASC) 5 MG tablet TAKE 1 TABLET (5 MG TOTAL) BY MOUTH DAILY.  Marland Kitchen aspirin 81 MG tablet Take 81 mg by mouth every morning.   Marland Kitchen atorvastatin (LIPITOR) 20 MG tablet Take 1 tablet (20 mg total) by mouth daily.  . calcium carbonate (OS-CAL) 600 MG TABS Take 600 mg by mouth every evening.   . Cholecalciferol (VITAMIN D-3) 5000 UNITS TABS Take 5,000 Units by mouth every evening.   . clonazePAM (KLONOPIN) 0.5 MG tablet Take 1 tablet (0.5 mg total) by mouth 2 (two) times daily as needed for anxiety.  Marland Kitchen escitalopram (LEXAPRO) 20 MG tablet TAKE 1 TABLET BY MOUTH EVERY DAY IN THE MORNING  . fluticasone (FLONASE) 50 MCG/ACT nasal spray SPRAY 2 SPRAYS INTO EACH NOSTRIL EVERY DAY  . levothyroxine (SYNTHROID) 88 MCG tablet Take 1 tablet (88 mcg total) by mouth daily.  . magnesium 30 MG tablet Take 30 mg by mouth daily.  . Multiple Vitamins-Minerals  (CENTRUM SILVER PO) Take 1 tablet by mouth every morning.   Marland Kitchen omeprazole (PRILOSEC) 40 MG capsule TAKE 1 CAPSULE BY MOUTH EVERY DAY  . Riboflavin (VITAMIN B-2 PO) Take 100 mg by mouth every morning.        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  HENT: Negative.   Eyes: Negative for pain.  Respiratory: Negative for shortness of breath.   Cardiovascular: Negative for chest pain, palpitations and leg swelling.  Gastrointestinal: Negative for abdominal pain.  Endocrine: Negative for polydipsia.  Genitourinary: Negative.   Skin: Negative for rash.  Neurological: Negative for dizziness, weakness and headaches.  Hematological: Does not bruise/bleed easily.  Psychiatric/Behavioral: Negative.   All other systems reviewed and are negative.      Objective:   Physical Exam  Constitutional: She is oriented to person, place, and time. She appears well-developed and well-nourished. No distress.  HENT:  Head: Normocephalic.  Nose: Nose normal.  Mouth/Throat: Oropharynx is clear and moist.  Eyes: Pupils are equal, round, and reactive to light. EOM are normal.  Neck: Normal range of motion. Neck supple. No JVD present. Carotid bruit is not present.  Cardiovascular: Normal rate, regular rhythm, normal heart sounds and intact distal pulses.  Pulmonary/Chest: Effort normal and  breath sounds normal. No respiratory distress. She has no wheezes. She has no rales. She exhibits no tenderness.  Abdominal: Soft. Normal appearance, normal aorta and bowel sounds are normal. She exhibits no distension, no abdominal bruit, no pulsatile midline mass and no mass. There is no splenomegaly or hepatomegaly. There is no tenderness.  Musculoskeletal: Normal range of motion. She exhibits no edema.  Lymphadenopathy:    She has no cervical adenopathy.  Neurological: She is  alert and oriented to person, place, and time. She has normal reflexes.  Skin: Skin is warm and dry.  Psychiatric: She has a normal mood and affect. Her behavior is normal. Judgment and thought content normal.  Nursing note and vitals reviewed.   BP 101/69   Pulse 90   Temp (!) 97.4 F (36.3 C) (Oral)   Ht 5' 8.75" (1.746 m)   Wt 202 lb (91.6 kg)   BMI 30.05 kg/m      Assessment & Plan:  Peggy Smith comes in today with chief complaint of Medical Management of Chronic Issues   Diagnosis and orders addressed:  1. Essential hypertension Low sodium diet - amLODipine (NORVASC) 5 MG tablet; TAKE 1 TABLET (5 MG TOTAL) BY MOUTH DAILY.  Dispense: 90 tablet; Refill: 1 - CMP14+EGFR  2. Gastroesophageal reflux disease without esophagitis Avoid spicy foods Do not eat 2 hours prior to bedtime - omeprazole (PRILOSEC) 40 MG capsule; TAKE 1 CAPSULE BY MOUTH EVERY DAY  Dispense: 90 capsule; Refill: 1  3. Hypothyroidism, unspecified type - levothyroxine (SYNTHROID) 88 MCG tablet; Take 1 tablet (88 mcg total) by mouth daily.  Dispense: 90 tablet; Refill: 1 - Thyroid Panel With TSH  4. Smoking Smoking cesation  5. Panic attacks Stress management - escitalopram (LEXAPRO) 20 MG tablet; TAKE 1 TABLET BY MOUTH EVERY DAY IN THE MORNING  Dispense: 90 tablet; Refill: 1  6. Anxiety state - clonazePAM (KLONOPIN) 0.5 MG tablet; Take 1 tablet (0.5 mg total) by mouth 2 (two) times daily as needed for anxiety.  Dispense: 60 tablet; Refill: 5  7. Pure hypercholesterolemia Low fat diet - atorvastatin (LIPITOR) 20 MG tablet; Take 1 tablet (20 mg total) by mouth daily.  Dispense: 90 tablet; Refill: 1 - Lipid panel  8. Iron deficiency anemia, unspecified iron deficiency anemia type - CBC with Differential/Platelet   Labs pending Health Maintenance reviewed Diet and exercise encouraged  Follow up plan: 6 months   [place], FNP

## 2018-06-29 ENCOUNTER — Encounter: Payer: Self-pay | Admitting: *Deleted

## 2018-06-29 ENCOUNTER — Ambulatory Visit (INDEPENDENT_AMBULATORY_CARE_PROVIDER_SITE_OTHER): Payer: Medicare PPO | Admitting: *Deleted

## 2018-06-29 VITALS — BP 134/78 | HR 90 | Ht 69.0 in | Wt 203.0 lb

## 2018-06-29 DIAGNOSIS — Z0000 Encounter for general adult medical examination without abnormal findings: Secondary | ICD-10-CM | POA: Diagnosis not present

## 2018-06-29 NOTE — Patient Instructions (Signed)
Please work on your goal of increasing fruit and vegetable intake to 3-5 servings per day.   Please consider getting the pneumonia and shingles vaccines in future.  You were scheduled for your mammogram on 07/31/18 at 2 pm.   Thank you for coming in for your Annual Wellness Visit today, and Happy Thanksgiving!    Preventive Care 73 Years and Older, Female Preventive care refers to lifestyle choices and visits with your health care provider that can promote health and wellness. What does preventive care include?  A yearly physical exam. This is also called an annual well check.  Dental exams once or twice a year.  Routine eye exams. Ask your health care provider how often you should have your eyes checked.  Personal lifestyle choices, including: ? Daily care of your teeth and gums. ? Regular physical activity. ? Eating a healthy diet. ? Avoiding tobacco and drug use. ? Limiting alcohol use. ? Practicing safe sex. ? Taking low-dose aspirin every day. ? Taking vitamin and mineral supplements as recommended by your health care provider. What happens during an annual well check? The services and screenings done by your health care provider during your annual well check will depend on your age, overall health, lifestyle risk factors, and family history of disease. Counseling Your health care provider may ask you questions about your:  Alcohol use.  Tobacco use.  Drug use.  Emotional well-being.  Home and relationship well-being.  Sexual activity.  Eating habits.  History of falls.  Memory and ability to understand (cognition).  Work and work Statistician.  Reproductive health.  Screening You may have the following tests or measurements:  Height, weight, and BMI.  Blood pressure.  Lipid and cholesterol levels. These may be checked every 5 years, or more frequently if you are over 16 years old.  Skin check.  Lung cancer screening. You may have this screening  every year starting at age 73 if you have a 30-pack-year history of smoking and currently smoke or have quit within the past 15 years.  Fecal occult blood test (FOBT) of the stool. You may have this test every year starting at age 73.  Flexible sigmoidoscopy or colonoscopy. You may have a sigmoidoscopy every 5 years or a colonoscopy every 10 years starting at age 73.  Hepatitis C blood test.  Hepatitis B blood test.  Sexually transmitted disease (STD) testing.  Diabetes screening. This is done by checking your blood sugar (glucose) after you have not eaten for a while (fasting). You may have this done every 1-3 years.  Bone density scan. This is done to screen for osteoporosis. You may have this done starting at age 73.  Mammogram. This may be done every 1-2 years. Talk to your health care provider about how often you should have regular mammograms.  Talk with your health care provider about your test results, treatment options, and if necessary, the need for more tests. Vaccines Your health care provider may recommend certain vaccines, such as:  Influenza vaccine. This is recommended every year.  Tetanus, diphtheria, and acellular pertussis (Tdap, Td) vaccine. You may need a Td booster every 10 years.  Varicella vaccine. You may need this if you have not been vaccinated.  Zoster vaccine. You may need this after age 73.  Measles, mumps, and rubella (MMR) vaccine. You may need at least one dose of MMR if you were born in 1957 or later. You may also need a second dose.  Pneumococcal 13-valent conjugate (PCV13) vaccine.  One dose is recommended after age 73.  Pneumococcal polysaccharide (PPSV23) vaccine. One dose is recommended after age 73.  Meningococcal vaccine. You may need this if you have certain conditions.  Hepatitis A vaccine. You may need this if you have certain conditions or if you travel or work in places where you may be exposed to hepatitis A.  Hepatitis B vaccine.  You may need this if you have certain conditions or if you travel or work in places where you may be exposed to hepatitis B.  Haemophilus influenzae type b (Hib) vaccine. You may need this if you have certain conditions.  Talk to your health care provider about which screenings and vaccines you need and how often you need them. This information is not intended to replace advice given to you by your health care provider. Make sure you discuss any questions you have with your health care provider. Document Released: 08/18/2015 Document Revised: 04/10/2016 Document Reviewed: 05/23/2015 Elsevier Interactive Patient Education  [employer].

## 2018-06-29 NOTE — Progress Notes (Addendum)
Subjective:   Peggy Smith is a 72 y.o. female who presents for an Initial Medicare Annual Wellness Visit.  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  She enjoys playing the organ, crocheting and playing computer/phone games. She lives with her husband and an elderly (75 year old) friend of her family lives with them also.  Peggy Smith has 4 children and 5 grandchildren, and 5 dogs.  She feels her health is unchanged from last year, and reports no surgeries, ER visits, or hospitalizations in the past year.   Review of Systems    Musculoskeletal - left hip pain        Objective:    Today's Vitals   06/29/18 1518  BP: 134/78  Pulse: 90  Weight: 203 lb (92.1 kg)  Height: 5\' 9"  (1.753 Smith)  PainSc: 3   PainLoc: Hip   Body mass index is 29.98 kg/Smith.  Advanced Directives 06/29/2018 06/13/2015 10/31/2011 05/17/2011  Does Patient Have a Medical Advance Directive? No No Patient does not have advance directive Patient does not have advance directive  Would patient like information on creating a medical advance directive? No - Patient declined - - -    Current Medications (verified) Outpatient Encounter Medications as of 06/29/2018  Medication Sig  . amLODipine (NORVASC) 5 MG tablet TAKE 1 TABLET (5 MG TOTAL) BY MOUTH DAILY.  Marland Kitchen aspirin 81 MG tablet Take 81 mg by mouth every morning.   . calcium carbonate (OS-CAL) 600 MG TABS Take 600 mg by mouth every evening.   . Cholecalciferol (VITAMIN D-3) 5000 UNITS TABS Take 5,000 Units by mouth every evening.   . clonazePAM (KLONOPIN) 0.5 MG tablet Take 1 tablet (0.5 mg total) by mouth 2 (two) times daily as needed for anxiety.  Marland Kitchen escitalopram (LEXAPRO) 20 MG tablet TAKE 1 TABLET BY MOUTH EVERY DAY IN THE MORNING  . fluticasone (FLONASE) 50 MCG/ACT nasal spray SPRAY 2 SPRAYS INTO EACH  NOSTRIL EVERY DAY  . levothyroxine (SYNTHROID) 88 MCG tablet Take 1 tablet (88 mcg total) by mouth daily.  . magnesium 30 MG tablet Take 30 mg by mouth daily.  . Multiple Vitamins-Minerals (CENTRUM SILVER PO) Take 1 tablet by mouth every morning.   Marland Kitchen omeprazole (PRILOSEC) 40 MG capsule TAKE 1 CAPSULE BY MOUTH EVERY DAY  . Riboflavin (VITAMIN B-2 PO) Take 100 mg by mouth every morning.   Marland Kitchen atorvastatin (LIPITOR) 20 MG tablet Take 1 tablet (20 mg total) by mouth daily. (Patient not taking: Reported on 06/29/2018)  . [DISCONTINUED] esomeprazole (NEXIUM) 40 MG capsule Take 40 mg by mouth daily before supper.   . [DISCONTINUED] hydrochlorothiazide 25 MG tablet Take 25 mg by mouth daily.     No facility-administered encounter medications on file as of 06/29/2018.     Allergies (verified) Crestor [rosuvastatin]; Macrodantin; Sulfa antibiotics; and Codeine   History: Past Medical History:  Diagnosis Date  . Angina    STRESS TEST NORMAL-ANXIETY  . Anxiety   . Cardiac arrhythmia due to congenital heart disease   . Depression   . GERD (gastroesophageal reflux disease)   . Headache(784.0)    OTC MEDS  . Hepatitis     1976  . Hyperlipidemia    DOES NOT TAKE MEDS - HAS THEM AT HOME BUT STATES DOES NOT TAKE  . Hypertension   . Hypothyroidism   .  Iron deficiency anemia   . Melanoma ([place])   . Panic attacks   . PONV (postoperative nausea and vomiting)    Past Surgical History:  Procedure Laterality Date  . COLONOSCOPY    . CYSTOSCOPY  11/08/2011   Procedure: CYSTOSCOPY;  Surgeon: Delice Lesch, MD;  Location: [facility];  Service: Gynecology;  Laterality: N/A;  . DILATION AND CURETTAGE OF UTERUS    . LAPAROSCOPIC HYSTERECTOMY  11/08/2011   Procedure: HYSTERECTOMY TOTAL LAPAROSCOPIC;  Surgeon: Delice Lesch, MD;  Location: [facility];  Service: Gynecology;  Laterality: N/A;  with Bilateral Salpingo-Oophorectomies  . MELANOMA EXCISION     Left Shoulder  . SVD      X 4  . TUBAL LIGATION    .  UPPER GASTROINTESTINAL ENDOSCOPY     Family History  Problem Relation Age of Onset  . Dementia Mother   . Brain cancer Father   . Anxiety disorder Brother   . Asthma Son   . Colon cancer Neg Hx   . Breast cancer Neg Hx    Social History   Socioeconomic History  . Marital status: Married    Spouse name: [name]  . Number of children: 4  . Years of education: Not on file  . Highest education level: Not on file  Occupational History  . Occupation: Office manager: [employer]  . Financial resource strain: Not hard at all  . Food insecurity:    Worry: Never true    Inability: Never true  . Transportation needs:    Medical: No    Non-medical: No  Tobacco Use  . Smoking status: Current Every Day Smoker    Packs/day: 0.25    Years: 20.00    Pack years: 5.00    Types: Cigarettes  . Smokeless tobacco: Never Used  Substance and Sexual Activity  . Alcohol use: No  . Drug use: No  . Sexual activity: Yes    Birth control/protection: Surgical  Lifestyle  . Physical activity:    Days per week: 6 days    Minutes per session: 20 min  . Stress: To some extent  Relationships  . Social connections:    Talks on phone: More than three times a week    Gets together: More than three times a week    Attends religious service: More than 4 times per year    Active member of club or organization: Yes    Attends meetings of clubs or organizations: More than 4 times per year    Relationship status: Married  Other Topics Concern  . Not on file  Social History Narrative  . Not on file    Tobacco Counseling Ready to quit: No Counseling given: Yes   Clinical Intake:     Pain Score: 3                   Activities of Daily Living In your present state of health, do you have any difficulty performing the following activities: 06/29/2018  Hearing? N  Vision? N  Difficulty concentrating or making decisions? N  Walking or climbing stairs? N    Dressing or bathing? N  Doing errands, shopping? N  Preparing Food and eating ? N  Using the Toilet? N  In the past six months, have you accidently leaked urine? Y  Comment stress incontinence   Do you have problems with loss of bowel control? Y  Comment with certain meals  Managing your Medications? N  Managing your Finances? N  Housekeeping or managing your Housekeeping? Y  Comment Has someone who helps her clean house  Some recent data might be hidden     Immunizations and Health Maintenance Immunization History  Administered Date(s) Administered  . Tdap 11/09/2013   There are no preventive care reminders to display for this patient.  Patient Care Team: Chevis Pretty, FNP as PCP - General (Family Medicine)      Assessment:   This is a routine wellness examination for Peggy Smith.  Hearing/Vision screen No hearing deficit noted  Patient states she sees well with her glasses.  Goes to [facility] yearly for eye exams.  Dietary issues and exercise activities discussed:    Patient states she eats 3 meals per day- usually 2 pieces of toast for breakfast, usually eats out for lunch when working [place], [place] food or fast food- sandwich, often does not eat supper, or has a snack because she is not hungry after lunch.  Recommended patient increase her fruit and vegetable intake to 3-5 servings per day.  Also recommended lean protein and whole grains.  Healthy cooking methods discussed.   Patient states she walks 15-20 minutes daily with her dog.   Goals    . DIET - EAT MORE FRUITS AND VEGETABLES     Increase fruit and vegetables intake to 3-5 servings per day.       Depression Screen PHQ 2/9 Scores 06/29/2018 06/19/2018 12/05/2017 06/06/2017 06/02/2017 11/29/2016 05/31/2016  PHQ - 2 Score 0 0 0 1 1 0 1    Fall Risk Fall Risk  06/29/2018 06/19/2018 12/05/2017 06/06/2017 06/02/2017  Falls in the past year? 0 0 No No No  Number falls in past yr: - - - - -  Injury  with Fall? - - - - -  Comment - - - - -    Is the patient's home free of loose throw rugs in walkways, pet beds, electrical cords, etc?   yes      Grab bars in the bathroom? yes      Handrails on the stairs?   yes      Adequate lighting?   yes    Cognitive Function: MMSE - Mini Mental State Exam 06/29/2018  Orientation to time 5  Orientation to Place 5  Registration 3  Attention/ Calculation 5  Recall 3  Language- name 2 objects 2  Language- repeat 1  Language- follow 3 step command 3  Language- read & follow direction 1  Write a sentence 1  Copy design 1  Total score 30        Screening Tests Health Maintenance  Topic Date Due  . INFLUENZA VACCINE  11/03/2018 (Originally 03/05/2018)  . PNA vac Low Risk Adult (1 of 2 - PCV13) 12/06/2018 (Originally 05/05/2010)  . MAMMOGRAM  07/09/2019  . COLONOSCOPY  03/28/2021  . TETANUS/TDAP  11/10/2023  . DEXA SCAN  Completed  . Hepatitis C Screening  Completed    Influenza and Prevnar 83 declined  Qualifies for Shingles Vaccine? Yes, declined today   Cancer Screenings: Lung: Low Dose CT Chest recommended if Age 3-80 years, 30 pack-year currently smoking OR have quit w/in 15years. Patient does not qualify. Breast: Up to date on Mammogram? Yes, scheduled for next mammogram 07/31/18 Up to date of Bone Density/Dexa? Yes Colorectal: up to date  Additional Screenings: Hepatitis C Screening: 11/29/2016     Plan:     Work on your goal of increasing fruit  and vegetable intake to 3-5 servings per day.  Consider getting the pneumonia and shingles vaccines in future. Have mammogram on 07/31/18 at 2 pm.  Follow up with Chevis Pretty as scheduled.  I have personally reviewed and noted the following in the patient's chart:   . Medical and social history . Use of alcohol, tobacco or illicit drugs  . Current medications and supplements . Functional ability and status . Nutritional status . Physical activity . Advanced  directives . List of other physicians . Hospitalizations, surgeries, and ER visits in previous 12 months . Vitals . Screenings to include cognitive, depression, and falls . Referrals and appointments  In addition, I have reviewed and discussed with patient certain preventive protocols, quality metrics, and best practice recommendations. A written personalized care plan for preventive services as well as general preventive health recommendations were provided to patient.     Peggy Melena M, RN   06/29/2018    I have reviewed and agree with the above AWV documentation.   [name] Hassell Done, FNP

## 2018-07-08 ENCOUNTER — Other Ambulatory Visit: Payer: Self-pay | Admitting: Nurse Practitioner

## 2018-07-08 DIAGNOSIS — J301 Allergic rhinitis due to pollen: Secondary | ICD-10-CM

## 2018-07-31 DIAGNOSIS — Z1231 Encounter for screening mammogram for malignant neoplasm of breast: Secondary | ICD-10-CM | POA: Diagnosis not present

## 2018-07-31 LAB — HM MAMMOGRAPHY

## 2018-09-03 ENCOUNTER — Ambulatory Visit: Payer: Medicare PPO | Admitting: Physician Assistant

## 2018-09-03 ENCOUNTER — Ambulatory Visit (INDEPENDENT_AMBULATORY_CARE_PROVIDER_SITE_OTHER): Payer: Medicare PPO

## 2018-09-03 ENCOUNTER — Encounter: Payer: Self-pay | Admitting: Physician Assistant

## 2018-09-03 VITALS — BP 131/73 | HR 92 | Temp 97.8°F | Ht 69.0 in | Wt 199.0 lb

## 2018-09-03 DIAGNOSIS — M545 Low back pain: Secondary | ICD-10-CM | POA: Diagnosis not present

## 2018-09-03 DIAGNOSIS — M9903 Segmental and somatic dysfunction of lumbar region: Secondary | ICD-10-CM | POA: Diagnosis not present

## 2018-09-03 MED ORDER — PREDNISONE 10 MG (48) PO TBPK: ORAL_TABLET | ORAL | 0 refills | Status: DC

## 2018-09-06 NOTE — Progress Notes (Signed)
BP 131/73   Pulse 92   Temp 97.8 F (36.6 C) (Oral)   Ht 5\' 9"  (1.753 m)   Wt 199 lb (90.3 kg)   BMI 29.39 kg/m    Subjective:    Patient ID: Peggy Smith, female    DOB: Dec 24, 1945, 73 y.o.   MRN: [id number]  HPI: Peggy Smith is a 73 y.o. female presenting on 09/03/2018 for Back Pain Patient states that for the last few days she has had increasing amount of lower back pain.  Is gone [place] almost every day and gotten worse as the day has gone on.  She has tried Advil, and spondylosis.  Is also tried Biofreeze.  There is not been any relief.  She denies she was changes to bed she says that is not something she normally does this is when she probably has strained her back.  She does not remember it hurting when she was doing the activity.   Past Medical History:  Diagnosis Date  . Angina    STRESS TEST NORMAL-ANXIETY  . Anxiety   . Cardiac arrhythmia due to congenital heart disease   . Depression   . GERD (gastroesophageal reflux disease)   . Headache(784.0)    OTC MEDS  . Hepatitis     1976  . Hyperlipidemia    DOES NOT TAKE MEDS - HAS THEM AT HOME BUT STATES DOES NOT TAKE  . Hypertension   . Hypothyroidism   . Iron deficiency anemia   . Melanoma ([place])   . Panic attacks   . PONV (postoperative nausea and vomiting)    Relevant past medical, surgical, family and social history reviewed and updated as indicated. Interim medical history since our last visit reviewed. Allergies and medications reviewed and updated. DATA REVIEWED: CHART IN EPIC  Family History reviewed for pertinent findings.  Review of Systems  Constitutional: Negative.   HENT: Negative.   Eyes: Negative.   Respiratory: Negative.   Gastrointestinal: Negative.   Genitourinary: Negative.   Musculoskeletal: Positive for arthralgias, back pain and myalgias.    Allergies as of 09/03/2018      Reactions   Crestor [rosuvastatin]    Muscle aches   Macrodantin Other (See Comments)   Liver  shut down   Sulfa Antibiotics Other (See Comments)   agitation   Codeine Nausea And Vomiting          Medication List       Accurate as of September 03, 2018 11:59 PM. Always use your most recent med list.        amLODipine 5 MG tablet Commonly known as:  NORVASC TAKE 1 TABLET (5 MG TOTAL) BY MOUTH DAILY.   aspirin 81 MG tablet Take 81 mg by mouth every morning.   atorvastatin 20 MG tablet Commonly known as:  LIPITOR Take 1 tablet (20 mg total) by mouth daily.   calcium carbonate 600 MG Tabs tablet Commonly known as:  OS-CAL Take 600 mg by mouth every evening.   CENTRUM SILVER PO Take 1 tablet by mouth every morning.   clonazePAM 0.5 MG tablet Commonly known as:  KLONOPIN Take 1 tablet (0.5 mg total) by mouth 2 (two) times daily as needed for anxiety.   escitalopram 20 MG tablet Commonly known as:  LEXAPRO TAKE 1 TABLET BY MOUTH EVERY DAY IN THE MORNING   fluticasone 50 MCG/ACT nasal spray Commonly known as:  FLONASE USE 2 SPRAYS IN EACH NOSTRIL ONCE DAILY   levothyroxine 88 MCG tablet Commonly  known as:  SYNTHROID Take 1 tablet (88 mcg total) by mouth daily.   magnesium 30 MG tablet Take 30 mg by mouth daily.   omeprazole 40 MG capsule Commonly known as:  PRILOSEC TAKE 1 CAPSULE BY MOUTH EVERY DAY   predniSONE 10 MG (48) Tbpk tablet Commonly known as:  STERAPRED UNI-PAK 48 TAB Take as directed for 12 days   VITAMIN B-2 PO Take 100 mg by mouth every morning.   Vitamin D-3 125 MCG (5000 UT) Tabs Take 5,000 Units by mouth every evening.          Objective:    BP 131/73   Pulse 92   Temp 97.8 F (36.6 C) (Oral)   Ht 5\' 9"  (1.753 m)   Wt 199 lb (90.3 kg)   BMI 29.39 kg/m   Allergies  Allergen Reactions  . Crestor [Rosuvastatin]     Muscle aches  . Macrodantin Other (See Comments)    Liver shut down  . Sulfa Antibiotics Other (See Comments)    agitation  . Codeine Nausea And Vomiting         Wt Readings from Last 3 Encounters:    09/03/18 199 lb (90.3 kg)  06/29/18 203 lb (92.1 kg)  06/19/18 202 lb (91.6 kg)    Physical Exam Constitutional:      Appearance: She is well-developed.  HENT:     Head: Normocephalic and atraumatic.  Eyes:     Conjunctiva/sclera: Conjunctivae normal.     Pupils: Pupils are equal, round, and reactive to light.  Cardiovascular:     Rate and Rhythm: Normal rate and regular rhythm.     Heart sounds: Normal heart sounds.  Pulmonary:     Effort: Pulmonary effort is normal.     Breath sounds: Normal breath sounds.  Abdominal:     General: Bowel sounds are normal.     Palpations: Abdomen is soft.  Musculoskeletal:     Lumbar back: She exhibits decreased range of motion, tenderness, pain and spasm.       Back:  Skin:    General: Skin is warm and dry.     Findings: No rash.  Neurological:     Mental Status: She is alert and oriented to person, place, and time.     Deep Tendon Reflexes: Reflexes are normal and symmetric.  Psychiatric:        Behavior: Behavior normal.        Thought Content: Thought content normal.        Judgment: Judgment normal.     Results for orders placed or performed in visit on 08/06/18  HM MAMMOGRAPHY  Result Value Ref Range   HM Mammogram 0-4 Bi-Rad 0-4 Bi-Rad, Self Reported Normal   Assessment & Plan:   1. Somatic dysfunction of lumbar region - DG Lumbar Spine 2-3 Views; Future - predniSONE (STERAPRED UNI-PAK 48 TAB) 10 MG (48) TBPK tablet; Take as directed for 12 days  Dispense: 48 tablet; Refill: 0   Continue all other maintenance medications as listed above.  Follow up plan: No follow-ups on file.  Educational handout given for [place] PA-C [facility] [address]  [place], [place] [address] [phone]   09/06/2018, 9:11 PM

## 2018-09-16 ENCOUNTER — Telehealth: Payer: Self-pay | Admitting: Nurse Practitioner

## 2018-09-16 NOTE — Telephone Encounter (Signed)
Patient's x-ray showed some arthritis in her spine but did not show much else, if she is hurting somewhere different, her leg then she likely needs to be seen and evaluated further, recommend follow-up with her PCP.

## 2018-09-18 NOTE — Telephone Encounter (Signed)
No answer

## 2018-09-25 NOTE — Telephone Encounter (Signed)
Multiple attempts made to contact patient.  This encounter will now be closed  

## 2018-10-12 ENCOUNTER — Ambulatory Visit (INDEPENDENT_AMBULATORY_CARE_PROVIDER_SITE_OTHER): Payer: Medicare PPO

## 2018-10-12 ENCOUNTER — Ambulatory Visit: Payer: Medicare PPO | Admitting: Nurse Practitioner

## 2018-10-12 ENCOUNTER — Encounter: Payer: Self-pay | Admitting: Nurse Practitioner

## 2018-10-12 VITALS — BP 116/76 | HR 81 | Temp 97.1°F | Ht 69.0 in | Wt 200.0 lb

## 2018-10-12 DIAGNOSIS — M25551 Pain in right hip: Secondary | ICD-10-CM

## 2018-10-12 MED ORDER — PREDNISONE 10 MG (21) PO TBPK: ORAL_TABLET | ORAL | 0 refills | Status: DC

## 2018-10-12 NOTE — Patient Instructions (Signed)

## 2018-10-12 NOTE — Progress Notes (Signed)
   Subjective:    Patient ID: Peggy Smith, female    DOB: 1946/08/02, 73 y.o.   MRN: [id number]   Chief Complaint: Right hip pain (leg has numbness)   HPI Patient comes in today c/o right hip pain that radiates down leg causing numbness in her lower leg. thisi started about 2 months ago. She saw ARonnald Ramp on January 30 with c/o low back pain. Was given prednisone and hip started hurting after she finished the prednisone. Rates pain 3/10 while sitting. Pain increases when rising from sitting to standing. Once she gets walking it is some better.    Review of Systems  Constitutional: Negative for activity change and appetite change.  HENT: Negative.   Eyes: Negative for pain.  Respiratory: Negative for shortness of breath.   Cardiovascular: Negative for chest pain, palpitations and leg swelling.  Gastrointestinal: Negative for abdominal pain.  Endocrine: Negative for polydipsia.  Genitourinary: Negative.   Musculoskeletal: Positive for arthralgias (right hip pain).  Skin: Negative for rash.  Neurological: Negative for dizziness, weakness and headaches.  Hematological: Does not bruise/bleed easily.  Psychiatric/Behavioral: Negative.   All other systems reviewed and are negative.      Objective:   Physical Exam Vitals signs and nursing note reviewed.  Constitutional:      Appearance: She is obese.  Cardiovascular:     Rate and Rhythm: Normal rate and regular rhythm.     Heart sounds: Normal heart sounds.  Pulmonary:     Effort: Pulmonary effort is normal.     Breath sounds: Normal breath sounds.  Abdominal:     General: Abdomen is flat.     Palpations: Abdomen is soft.  Musculoskeletal:     Comments: FROM of right hip with pain on flexion and extension. Mild pain with external rotation  Skin:    General: Skin is warm.  Neurological:     General: No focal deficit present.     Mental Status: She is alert and oriented to person, place, and time.  Psychiatric:      Mood and Affect: Mood normal.        Behavior: Behavior normal.    BP 116/76   Pulse 81   Temp (!) 97.1 F (36.2 C) (Oral)   Ht 5\' 9"  (1.753 m)   Wt 200 lb (90.7 kg)   SpO2 96%   BMI 29.53 kg/m         Assessment & Plan:  Peggy Smith in today with chief complaint of Right hip pain (leg has numbness)   1. Right hip pain Rest Moist heat No motrin while on prednisone Understands that may need MRI Will call once I get radiology report - DG HIP UNILAT W OR W/O PELVIS 2-3 VIEWS RIGHT; Future - predniSONE (STERAPRED UNI-PAK 21 TAB) 10 MG (21) TBPK tablet; As directed x 6 days  Dispense: 21 tablet; Refill: 0  [name] Hassell Done, FNP

## 2018-10-21 NOTE — Progress Notes (Signed)
[facility] to x-ray 

## 2018-12-18 ENCOUNTER — Other Ambulatory Visit: Payer: Self-pay

## 2018-12-18 ENCOUNTER — Ambulatory Visit (INDEPENDENT_AMBULATORY_CARE_PROVIDER_SITE_OTHER): Payer: Medicare PPO | Admitting: Nurse Practitioner

## 2018-12-18 ENCOUNTER — Encounter: Payer: Self-pay | Admitting: Nurse Practitioner

## 2018-12-18 DIAGNOSIS — E039 Hypothyroidism, unspecified: Secondary | ICD-10-CM | POA: Diagnosis not present

## 2018-12-18 DIAGNOSIS — F411 Generalized anxiety disorder: Secondary | ICD-10-CM | POA: Diagnosis not present

## 2018-12-18 DIAGNOSIS — K219 Gastro-esophageal reflux disease without esophagitis: Secondary | ICD-10-CM | POA: Diagnosis not present

## 2018-12-18 DIAGNOSIS — I10 Essential (primary) hypertension: Secondary | ICD-10-CM | POA: Diagnosis not present

## 2018-12-18 DIAGNOSIS — C439 Malignant melanoma of skin, unspecified: Secondary | ICD-10-CM

## 2018-12-18 DIAGNOSIS — Z6829 Body mass index (BMI) 29.0-29.9, adult: Secondary | ICD-10-CM

## 2018-12-18 DIAGNOSIS — D509 Iron deficiency anemia, unspecified: Secondary | ICD-10-CM

## 2018-12-18 DIAGNOSIS — E7800 Pure hypercholesterolemia, unspecified: Secondary | ICD-10-CM | POA: Diagnosis not present

## 2018-12-18 DIAGNOSIS — F410 Panic disorder [episodic paroxysmal anxiety] without agoraphobia: Secondary | ICD-10-CM | POA: Diagnosis not present

## 2018-12-18 DIAGNOSIS — F17200 Nicotine dependence, unspecified, uncomplicated: Secondary | ICD-10-CM | POA: Diagnosis not present

## 2018-12-18 MED ORDER — ATORVASTATIN CALCIUM 20 MG PO TABS
20.0000 mg | ORAL_TABLET | Freq: Every day | ORAL | 1 refills | Status: DC
Start: 2018-12-18 — End: 2019-03-23

## 2018-12-18 MED ORDER — ESCITALOPRAM OXALATE 20 MG PO TABS: ORAL_TABLET | ORAL | 1 refills | Status: DC

## 2018-12-18 MED ORDER — AMLODIPINE BESYLATE 5 MG PO TABS
ORAL_TABLET | ORAL | 1 refills | Status: DC
Start: 2018-12-18 — End: 2019-03-23

## 2018-12-18 MED ORDER — LEVOTHYROXINE SODIUM 88 MCG PO TABS: 88.0000 ug | ORAL_TABLET | Freq: Every day | ORAL | 1 refills | Status: DC

## 2018-12-18 MED ORDER — CLONAZEPAM 0.5 MG PO TABS: 0.5000 mg | ORAL_TABLET | Freq: Two times a day (BID) | ORAL | 5 refills | Status: DC | PRN

## 2018-12-18 NOTE — Progress Notes (Signed)
Virtual Visit via telephone Note  I connected with Peggy Smith on 12/18/18 at 1:55 PM by telephone and verified that I am speaking with the correct person using two identifiers. Peggy Smith is currently located at home  and her husband is currently with her during visit. The provider, [name] Hassell Done, FNP is located in their office at time of visit.  I discussed the limitations, risks, security and privacy concerns of performing an evaluation and management service by telephone and the availability of in person appointments. I also discussed with the patient that there may be a patient responsible charge related to this service. The patient expressed understanding and agreed to proceed.   History and Present Illness:   Chief Complaint: Medical Management of Chronic Issues    HPI:  1. Essential hypertension No c/o chest pain, sob or headache. Does not check blood pressure at home. BP Readings from Last 3 Encounters:  10/12/18 116/76  09/03/18 131/73  06/29/18 134/78     2. Pure hypercholesterolemia She does not watch diet and does very little exercise.  3. Hypothyroidism, unspecified type No problems that she is aware of.  4. Gastroesophageal reflux disease without esophagitis Not currently on any rx meds right now. Uses TUMS OTC when needed.  5. Iron deficiency anemia, unspecified iron deficiency anemia type Last hgb was 12.9. Patient did not have labs drawn at last visit as told.  6. Smoking Smokes over a pack a day  7. Panic attacks Patient gets anxious very easily. She is currently on klonopin BID which helps with her anxiety.  8. Anxiety state See above. She is also on lexapro which helps her a lot.  9. Malignant melanoma, unspecified site ([place]) No recent flare ups- has not seen derm in over a year.  10. BMI 29.0-29.9,adult No recent weight changes    Outpatient Encounter Medications as of 12/18/2018  Medication Sig  . amLODipine  (NORVASC) 5 MG tablet TAKE 1 TABLET (5 MG TOTAL) BY MOUTH DAILY.  Marland Kitchen aspirin 81 MG tablet Take 81 mg by mouth every morning.   Marland Kitchen atorvastatin (LIPITOR) 20 MG tablet Take 1 tablet (20 mg total) by mouth daily.  . calcium carbonate (OS-CAL) 600 MG TABS Take 600 mg by mouth every evening.   . Cholecalciferol (VITAMIN D-3) 5000 UNITS TABS Take 5,000 Units by mouth every evening.   . clonazePAM (KLONOPIN) 0.5 MG tablet Take 1 tablet (0.5 mg total) by mouth 2 (two) times daily as needed for anxiety.  Marland Kitchen escitalopram (LEXAPRO) 20 MG tablet TAKE 1 TABLET BY MOUTH EVERY DAY IN THE MORNING  . fluticasone (FLONASE) 50 MCG/ACT nasal spray USE 2 SPRAYS IN EACH NOSTRIL ONCE DAILY  . levothyroxine (SYNTHROID) 88 MCG tablet Take 1 tablet (88 mcg total) by mouth daily.  . magnesium 30 MG tablet Take 30 mg by mouth daily.  . Multiple Vitamins-Minerals (CENTRUM SILVER PO) Take 1 tablet by mouth every morning.   . predniSONE (STERAPRED UNI-PAK 21 TAB) 10 MG (21) TBPK tablet As directed x 6 days  . Riboflavin (VITAMIN B-2 PO) Take 100 mg by mouth every morning.      New complaints: None today  Social history: Lives with her husband. Has stayed in the house     Review of Systems  Constitutional: Negative for diaphoresis and weight loss.  Eyes: Negative for blurred vision, double vision and pain.  Respiratory: Negative for shortness of breath.   Cardiovascular: Negative for chest pain, palpitations, orthopnea and leg swelling.  Gastrointestinal: Negative for abdominal pain.  Skin: Negative for rash.  Neurological: Negative for dizziness, sensory change, loss of consciousness, weakness and headaches.  Endo/Heme/Allergies: Negative for polydipsia. Does not bruise/bleed easily.  Psychiatric/Behavioral: Negative for memory loss. The patient does not have insomnia.   All other systems reviewed and are negative.    Observations/Objective: Alert and oriented Very talkative  today  Assessment and Plan:  Peggy Smith comes in today with chief complaint of Medical Management of Chronic Issues   Diagnosis and orders addressed:  1. Essential hypertension Low sdoiume diet - amLODipine (NORVASC) 5 MG tablet; TAKE 1 TABLET (5 MG TOTAL) BY MOUTH DAILY.  Dispense: 90 tablet; Refill: 1  2. Pure hypercholesterolemia Low fat diet - atorvastatin (LIPITOR) 20 MG tablet; Take 1 tablet (20 mg total) by mouth daily.  Dispense: 90 tablet; Refill: 1  3. Hypothyroidism, unspecified type - levothyroxine (SYNTHROID) 88 MCG tablet; Take 1 tablet (88 mcg total) by mouth daily.  Dispense: 90 tablet; Refill: 1  4. Gastroesophageal reflux disease without esophagitis Avoid spicy foods Do not eat 2 hours prior to bedtime  5. Iron deficiency anemia, unspecified iron deficiency anemia type Daily iron supplement  6. Smoking Smoking cessation encouraged  7. Panic attacks Stress management - escitalopram (LEXAPRO) 20 MG tablet; TAKE 1 TABLET BY MOUTH EVERY DAY IN THE MORNING  Dispense: 90 tablet; Refill: 1  8. Anxiety state - clonazePAM (KLONOPIN) 0.5 MG tablet; Take 1 tablet (0.5 mg total) by mouth 2 (two) times daily as needed for anxiety.  Dispense: 60 tablet; Refill: 5  9. Malignant melanoma, unspecified site [facility]) Need to make follow up appointment with dermatology  10. BMI 29.0-29.9,adult Discussed diet and exercise for person with BMI >25 Will recheck weight in 3-6 months   Previous lab results reviewed Health Maintenance reviewed Diet and exercise encouraged  Follow up plan: 3 months     I discussed the assessment and treatment plan with the patient. The patient was provided an opportunity to ask questions and all were answered. The patient agreed with the plan and demonstrated an understanding of the instructions.   The patient was advised to call back or seek an in-person evaluation if the symptoms worsen or if the condition fails to improve as anticipated.  The above assessment  and management plan was discussed with the patient. The patient verbalized understanding of and has agreed to the management plan. Patient is aware to call the clinic if symptoms persist or worsen. Patient is aware when to return to the clinic for a follow-up visit. Patient educated on when it is appropriate to go to the emergency department.   Time call ended:  2:15  I provided 20 minutes of non-face-to-face time during this encounter.    [name] Hassell Done, FNP

## 2019-02-22 ENCOUNTER — Other Ambulatory Visit: Payer: Self-pay | Admitting: Nurse Practitioner

## 2019-02-22 DIAGNOSIS — K219 Gastro-esophageal reflux disease without esophagitis: Secondary | ICD-10-CM

## 2019-03-22 ENCOUNTER — Ambulatory Visit: Payer: Self-pay | Admitting: Nurse Practitioner

## 2019-03-23 ENCOUNTER — Encounter: Payer: Self-pay | Admitting: Nurse Practitioner

## 2019-03-23 ENCOUNTER — Other Ambulatory Visit: Payer: Self-pay

## 2019-03-23 ENCOUNTER — Ambulatory Visit: Payer: Medicare PPO | Admitting: Nurse Practitioner

## 2019-03-23 VITALS — BP 99/69 | HR 89 | Temp 96.2°F | Ht 69.0 in | Wt 198.0 lb

## 2019-03-23 DIAGNOSIS — K219 Gastro-esophageal reflux disease without esophagitis: Secondary | ICD-10-CM

## 2019-03-23 DIAGNOSIS — E7800 Pure hypercholesterolemia, unspecified: Secondary | ICD-10-CM | POA: Diagnosis not present

## 2019-03-23 DIAGNOSIS — E039 Hypothyroidism, unspecified: Secondary | ICD-10-CM | POA: Diagnosis not present

## 2019-03-23 DIAGNOSIS — I10 Essential (primary) hypertension: Secondary | ICD-10-CM

## 2019-03-23 DIAGNOSIS — F410 Panic disorder [episodic paroxysmal anxiety] without agoraphobia: Secondary | ICD-10-CM

## 2019-03-23 DIAGNOSIS — D509 Iron deficiency anemia, unspecified: Secondary | ICD-10-CM

## 2019-03-23 DIAGNOSIS — F411 Generalized anxiety disorder: Secondary | ICD-10-CM

## 2019-03-23 DIAGNOSIS — Z6829 Body mass index (BMI) 29.0-29.9, adult: Secondary | ICD-10-CM

## 2019-03-23 MED ORDER — LEVOTHYROXINE SODIUM 88 MCG PO TABS: 88.0000 ug | ORAL_TABLET | Freq: Every day | ORAL | 1 refills | Status: DC

## 2019-03-23 MED ORDER — ESCITALOPRAM OXALATE 20 MG PO TABS
ORAL_TABLET | ORAL | 1 refills | Status: DC
Start: 2019-03-23 — End: 2019-09-23

## 2019-03-23 MED ORDER — ATORVASTATIN CALCIUM 20 MG PO TABS: 20.0000 mg | ORAL_TABLET | Freq: Every day | ORAL | 1 refills | Status: DC

## 2019-03-23 MED ORDER — AMLODIPINE BESYLATE 5 MG PO TABS
ORAL_TABLET | ORAL | 1 refills | Status: DC
Start: 2019-03-23 — End: 2019-09-14

## 2019-03-23 NOTE — Patient Instructions (Signed)

## 2019-03-23 NOTE — Addendum Note (Signed)
Addended by: Chevis Pretty on: 03/23/2019 11:58 AM   Modules accepted: Orders

## 2019-03-23 NOTE — Progress Notes (Signed)
Subjective:    Patient ID: Peggy Smith, female    DOB: 02/08/1946, 73 y.o.   MRN: [id number]   Chief Complaint: medical management of chronic issues   HPI:  1. Essential hypertension No c/o chest pain, sob or headache. Does not check blood pressure at home. BP Readings from Last 3 Encounters:  03/23/19 99/69  10/12/18 116/76  09/03/18 131/73     2. Pure hypercholesterolemia Does not watch diet and does very little exercise. Lab Results  Component Value Date   CHOL 181 12/05/2017   HDL 42 12/05/2017   LDLCALC 108 (H) 12/05/2017   TRIG 155 (H) 12/05/2017   CHOLHDL 4.3 12/05/2017     3. Gastroesophageal reflux disease without esophagitis Is on omeprazole dialy and works well to keep symptoms under control  4. Hypothyroidism, unspecified type No problems that she is aware of.  5. Panic attacks She is currently on lexapro. Has had no panic attacks since she has been quarantined at home. Only goes out of house to [employer] and go to the doctor.  6. Anxiety state Again being quarantined at home helps her anxiety  7. Iron deficiency anemia, unspecified iron deficiency anemia type Needs labs checked.  Lab Results  Component Value Date   HGB 12.9 12/05/2017     8. BMI 29.0-29.9,adult No weight changes    Outpatient Encounter Medications as of 03/23/2019  Medication Sig  . amLODipine (NORVASC) 5 MG tablet TAKE 1 TABLET (5 MG TOTAL) BY MOUTH DAILY.  Marland Kitchen aspirin 81 MG tablet Take 81 mg by mouth every morning.   Marland Kitchen atorvastatin (LIPITOR) 20 MG tablet Take 1 tablet (20 mg total) by mouth daily.  . calcium carbonate (OS-CAL) 600 MG TABS Take 600 mg by mouth every evening.   . Cholecalciferol (VITAMIN D-3) 5000 UNITS TABS Take 5,000 Units by mouth every evening.   . clonazePAM (KLONOPIN) 0.5 MG tablet Take 1 tablet (0.5 mg total) by mouth 2 (two) times daily as needed for anxiety.  Marland Kitchen escitalopram (LEXAPRO) 20 MG tablet TAKE 1 TABLET BY MOUTH EVERY DAY IN THE  MORNING  . fluticasone (FLONASE) 50 MCG/ACT nasal spray USE 2 SPRAYS IN EACH NOSTRIL ONCE DAILY  . levothyroxine (SYNTHROID) 88 MCG tablet Take 1 tablet (88 mcg total) by mouth daily.  . magnesium 30 MG tablet Take 30 mg by mouth daily.  . Multiple Vitamins-Minerals (CENTRUM SILVER PO) Take 1 tablet by mouth every morning.   Marland Kitchen omeprazole (PRILOSEC) 40 MG capsule TAKE 1 CAPSULE BY MOUTH EVERY DAY  . Riboflavin (VITAMIN B-2 PO) Take 100 mg by mouth every morning.      Past Surgical History:  Procedure Laterality Date  . COLONOSCOPY    . CYSTOSCOPY  11/08/2011   Procedure: CYSTOSCOPY;  Surgeon: Delice Lesch, MD;  Location: [facility];  Service: Gynecology;  Laterality: N/A;  . DILATION AND CURETTAGE OF UTERUS    . LAPAROSCOPIC HYSTERECTOMY  11/08/2011   Procedure: HYSTERECTOMY TOTAL LAPAROSCOPIC;  Surgeon: Delice Lesch, MD;  Location: [facility];  Service: Gynecology;  Laterality: N/A;  with Bilateral Salpingo-Oophorectomies  . MELANOMA EXCISION     Left Shoulder  . SVD      X 4  . TUBAL LIGATION    . UPPER GASTROINTESTINAL ENDOSCOPY      Family History  Problem Relation Age of Onset  . Dementia Mother   . Brain cancer Father   . Anxiety disorder Brother   . Asthma Son   . Colon  cancer Neg Hx   . Breast cancer Neg Hx     New complaints: None today  Social history: Lives with her husband who was recently diagnosed wit IPF  Controlled substance contract: n/a    Review of Systems  Constitutional: Negative for activity change and appetite change.  HENT: Negative.   Eyes: Negative for pain.  Respiratory: Negative for shortness of breath.   Cardiovascular: Negative for chest pain, palpitations and leg swelling.  Gastrointestinal: Negative for abdominal pain.  Endocrine: Negative for polydipsia.  Genitourinary: Negative.   Skin: Negative for rash.  Neurological: Negative for dizziness, weakness and headaches.  Hematological: Does not bruise/bleed easily.   Psychiatric/Behavioral: Negative.   All other systems reviewed and are negative.      Objective:   Physical Exam Vitals signs and nursing note reviewed.  Constitutional:      General: She is not in acute distress.    Appearance: Normal appearance. She is well-developed.  HENT:     Head: Normocephalic.     Nose: Nose normal.  Eyes:     Pupils: Pupils are equal, round, and reactive to light.  Neck:     Musculoskeletal: Normal range of motion and neck supple.     Vascular: No carotid bruit or JVD.  Cardiovascular:     Rate and Rhythm: Normal rate and regular rhythm.     Heart sounds: Normal heart sounds.  Pulmonary:     Effort: Pulmonary effort is normal. No respiratory distress.     Breath sounds: Normal breath sounds. No wheezing or rales.  Chest:     Chest wall: No tenderness.  Abdominal:     General: Bowel sounds are normal. There is no distension or abdominal bruit.     Palpations: Abdomen is soft. There is no hepatomegaly, splenomegaly, mass or pulsatile mass.     Tenderness: There is no abdominal tenderness.  Musculoskeletal: Normal range of motion.  Lymphadenopathy:     Cervical: No cervical adenopathy.  Skin:    General: Skin is warm and dry.  Neurological:     Mental Status: She is alert and oriented to person, place, and time.     Deep Tendon Reflexes: Reflexes are normal and symmetric.  Psychiatric:        Behavior: Behavior normal.        Thought Content: Thought content normal.        Judgment: Judgment normal.     BP 99/69   Pulse 89   Temp (!) 96.2 F (35.7 C) (Oral)   Ht 5\' 9"  (1.753 m)   Wt 198 lb (89.8 kg)   BMI 29.24 kg/m        Assessment & Plan:  DAJA SHUPING comes in today with chief complaint of No chief complaint on file.   Diagnosis and orders addressed:  1. Essential hypertension Low sodium diet - amLODipine (NORVASC) 5 MG tablet; TAKE 1 TABLET (5 MG TOTAL) BY MOUTH DAILY.  Dispense: 90 tablet; Refill: 1  2. Pure  hypercholesterolemia Low fat diet - atorvastatin (LIPITOR) 20 MG tablet; Take 1 tablet (20 mg total) by mouth daily.  Dispense: 90 tablet; Refill: 1  3. Gastroesophageal reflux disease without esophagitis Avoid spicy foods Do not eat 2 hours prior to bedtime  4. Hypothyroidism, unspecified type - levothyroxine (SYNTHROID) 88 MCG tablet; Take 1 tablet (88 mcg total) by mouth daily.  Dispense: 90 tablet; Refill: 1  5. Panic attacks Stress management - escitalopram (LEXAPRO) 20 MG tablet; TAKE 1 TABLET  BY MOUTH EVERY DAY IN THE MORNING  Dispense: 90 tablet; Refill: 1  6. Anxiety state  7. Iron deficiency anemia, unspecified iron deficiency anemia type Labs pending  8. BMI 29.0-29.9,adult Discussed diet and exercise for person with BMI >25 Will recheck weight in 3-6 months   Labs pending Health Maintenance reviewed Diet and exercise encouraged  Follow up plan: 6 months   [name] Hassell Done, FNP

## 2019-03-24 LAB — LIPID PANEL
Chol/HDL Ratio: 4.2 ratio (ref 0.0–4.4)
Cholesterol, Total: 191 mg/dL (ref 100–199)
HDL: 45 mg/dL (ref >39)
LDL Calculated: 116 mg/dL — ABNORMAL HIGH (ref 0–99)
Triglycerides: 150 mg/dL — ABNORMAL HIGH (ref 0–149)
VLDL Cholesterol Cal: 30 mg/dL (ref 5–40)

## 2019-03-24 LAB — CMP14+EGFR
ALT: 14 IU/L (ref 0–32)
AST: 16 IU/L (ref 0–40)
Albumin/Globulin Ratio: 2.1 (ref 1.2–2.2)
Albumin: 4.6 g/dL (ref 3.7–4.7)
Alkaline Phosphatase: 99 IU/L (ref 39–117)
BUN/Creatinine Ratio: 13 (ref 12–28)
BUN: 14 mg/dL (ref 8–27)
Bilirubin Total: 0.3 mg/dL (ref 0.0–1.2)
CO2: 25 mmol/L (ref 20–29)
Calcium: 9.9 mg/dL (ref 8.7–10.3)
Chloride: 100 mmol/L (ref 96–106)
Creatinine, Ser: 1.06 mg/dL — ABNORMAL HIGH (ref 0.57–1.00)
GFR calc Af Amer: 60 mL/min/{1.73_m2} (ref >59)
GFR calc non Af Amer: 52 mL/min/{1.73_m2} — ABNORMAL LOW (ref >59)
Globulin, Total: 2.2 g/dL (ref 1.5–4.5)
Glucose: 103 mg/dL — ABNORMAL HIGH (ref 65–99)
Potassium: 4.7 mmol/L (ref 3.5–5.2)
Sodium: 140 mmol/L (ref 134–144)
Total Protein: 6.8 g/dL (ref 6.0–8.5)

## 2019-03-24 LAB — THYROID PANEL WITH TSH
Free Thyroxine Index: 2.3 (ref 1.2–4.9)
T3 Uptake Ratio: 26 % (ref 24–39)
T4, Total: 8.7 ug/dL (ref 4.5–12.0)
TSH: 1.32 u[IU]/mL (ref 0.450–4.500)

## 2019-08-13 ENCOUNTER — Other Ambulatory Visit: Payer: Self-pay | Admitting: Nurse Practitioner

## 2019-08-13 DIAGNOSIS — J301 Allergic rhinitis due to pollen: Secondary | ICD-10-CM

## 2019-08-18 ENCOUNTER — Other Ambulatory Visit: Payer: Self-pay | Admitting: Nurse Practitioner

## 2019-08-18 DIAGNOSIS — K219 Gastro-esophageal reflux disease without esophagitis: Secondary | ICD-10-CM

## 2019-08-18 NOTE — Telephone Encounter (Signed)
Ov 09/23/19

## 2019-09-09 ENCOUNTER — Ambulatory Visit: Payer: Medicare PPO | Attending: Internal Medicine

## 2019-09-09 DIAGNOSIS — Z23 Encounter for immunization: Secondary | ICD-10-CM

## 2019-09-09 NOTE — Progress Notes (Signed)
   Covid-19 Vaccination Clinic  Name:  Peggy Smith    MRN: [id number] DOB: 11-21-1945  09/09/2019  Peggy Smith was observed post Covid-19 immunization for 15 minutes without incidence. She was provided with Vaccine Information Sheet and instruction to access the V-Safe system.   Peggy Smith was instructed to call 911 with any severe reactions post vaccine: Marland Kitchen Difficulty breathing  . Swelling of your face and throat  . A fast heartbeat  . A bad rash all over your body  . Dizziness and weakness    Immunizations Administered    Name Date Dose VIS Date Route   Moderna COVID-19 Vaccine 09/09/2019 12:00 PM 0.5 mL 07/06/2019 Intramuscular   Manufacturer: Levan Hurst   Lot: [id number]   NDC: [id number]   Moderna COVID-19 Vaccine 09/09/2019 12:09 PM 0.5 mL 07/06/2019 Intramuscular   Manufacturer: Levan Hurst   Lot: [id number]   NDC: [id number]   Moderna COVID-19 Vaccine 09/09/2019 12:29 PM 0.5 mL 07/06/2019 Intramuscular   Manufacturer: Levan Hurst   Lot: [id number]   NDC: [id number]   Moderna COVID-19 Vaccine 09/09/2019 12:50 PM 0.5 mL 07/06/2019 Intramuscular   Manufacturer: Moderna   Lot: [id number]   [id number]

## 2019-09-14 ENCOUNTER — Other Ambulatory Visit: Payer: Self-pay | Admitting: Nurse Practitioner

## 2019-09-14 DIAGNOSIS — I10 Essential (primary) hypertension: Secondary | ICD-10-CM

## 2019-09-21 ENCOUNTER — Encounter: Payer: Self-pay | Admitting: Nurse Practitioner

## 2019-09-23 ENCOUNTER — Ambulatory Visit (INDEPENDENT_AMBULATORY_CARE_PROVIDER_SITE_OTHER): Payer: Medicare PPO | Admitting: Nurse Practitioner

## 2019-09-23 ENCOUNTER — Encounter: Payer: Self-pay | Admitting: Nurse Practitioner

## 2019-09-23 DIAGNOSIS — E039 Hypothyroidism, unspecified: Secondary | ICD-10-CM

## 2019-09-23 DIAGNOSIS — D509 Iron deficiency anemia, unspecified: Secondary | ICD-10-CM

## 2019-09-23 DIAGNOSIS — C439 Malignant melanoma of skin, unspecified: Secondary | ICD-10-CM

## 2019-09-23 DIAGNOSIS — F17200 Nicotine dependence, unspecified, uncomplicated: Secondary | ICD-10-CM

## 2019-09-23 DIAGNOSIS — F411 Generalized anxiety disorder: Secondary | ICD-10-CM

## 2019-09-23 DIAGNOSIS — K219 Gastro-esophageal reflux disease without esophagitis: Secondary | ICD-10-CM

## 2019-09-23 DIAGNOSIS — E7800 Pure hypercholesterolemia, unspecified: Secondary | ICD-10-CM | POA: Diagnosis not present

## 2019-09-23 DIAGNOSIS — Z6829 Body mass index (BMI) 29.0-29.9, adult: Secondary | ICD-10-CM

## 2019-09-23 DIAGNOSIS — F410 Panic disorder [episodic paroxysmal anxiety] without agoraphobia: Secondary | ICD-10-CM

## 2019-09-23 DIAGNOSIS — I10 Essential (primary) hypertension: Secondary | ICD-10-CM

## 2019-09-23 DIAGNOSIS — J301 Allergic rhinitis due to pollen: Secondary | ICD-10-CM

## 2019-09-23 MED ORDER — FLUTICASONE PROPIONATE 50 MCG/ACT NA SUSP: NASAL | 1 refills | Status: DC

## 2019-09-23 MED ORDER — AMLODIPINE BESYLATE 5 MG PO TABS: ORAL_TABLET | ORAL | 1 refills | Status: DC

## 2019-09-23 MED ORDER — OMEPRAZOLE 40 MG PO CPDR: 40.0000 mg | DELAYED_RELEASE_CAPSULE | Freq: Every day | ORAL | 1 refills | Status: DC

## 2019-09-23 MED ORDER — ESCITALOPRAM OXALATE 20 MG PO TABS: ORAL_TABLET | ORAL | 1 refills | Status: DC

## 2019-09-23 MED ORDER — LEVOTHYROXINE SODIUM 88 MCG PO TABS: 88.0000 ug | ORAL_TABLET | Freq: Every day | ORAL | 1 refills | Status: DC

## 2019-09-23 MED ORDER — CLONAZEPAM 0.5 MG PO TABS: 0.5000 mg | ORAL_TABLET | Freq: Two times a day (BID) | ORAL | 5 refills | Status: DC | PRN

## 2019-09-23 NOTE — Progress Notes (Signed)
Virtual Visit via telephone Note Due to COVID-19 pandemic this visit was conducted virtually. This visit type was conducted due to national recommendations for restrictions regarding the COVID-19 Pandemic (e.g. social distancing, sheltering in place) in an effort to limit this patient's exposure and mitigate transmission in our community. All issues noted in this document were discussed and addressed.  A physical exam was not performed with this format.  I connected with Peggy Smith on 09/23/19 at 10:45 by telephone and verified that I am speaking with the correct person using two identifiers. Peggy Smith is currently located at home and no one is currently with her during visit. The provider, [name] Hassell Done, FNP is located in their office at time of visit.  I discussed the limitations, risks, security and privacy concerns of performing an evaluation and management service by telephone and the availability of in person appointments. I also discussed with the patient that there may be a patient responsible charge related to this service. The patient expressed understanding and agreed to proceed.   History and Present Illness:   Chief Complaint: Medical Management of Chronic Issues    HPI:  1. Essential hypertension No c/o chest pain, sob or headache. Does not check blood pressure at home. BP Readings from Last 3 Encounters:  03/23/19 99/69  10/12/18 116/76  09/03/18 131/73     2. Pure hypercholesterolemia Does not watchdiet and does very little exercise. Lab Results  Component Value Date   CHOL 191 03/23/2019   HDL 45 03/23/2019   LDLCALC 116 (H) 03/23/2019   TRIG 150 (H) 03/23/2019   CHOLHDL 4.2 03/23/2019     3. Gastroesophageal reflux disease without esophagitis Is on omeprazole daily  4. Hypothyroidism, unspecified type No problems that she is aware of. Lab Results  Component Value Date   TSH 1.320 03/23/2019     5. Panic attacks Has had no  panic attacks since last visit  6. Anxiety state She has had issues with anxiety all of her life. She has been on klonopin which really does help a lot.  7. Iron deficiency anemia, unspecified iron deficiency anemia type She is fatigued. Has not been sleeping much lately.\ Lab Results  Component Value Date   HGB 12.9 12/05/2017      8. Malignant melanoma, unspecified site [facility]) She sees dermatology every 6 months. Had one removed form he rface several years ago.  9. Smoking Said she is just not in nay frame of mind to stop smoking right now.  10. BMI 29.0-29.9,adult No recent weight changes. Wt Readings from Last 3 Encounters:  03/23/19 198 lb (89.8 kg)  10/12/18 200 lb (90.7 kg)  09/03/18 199 lb (90.3 kg)   BMI Readings from Last 3 Encounters:  03/23/19 29.24 kg/m  10/12/18 29.53 kg/m  09/03/18 29.39 kg/m       Outpatient Encounter Medications as of 09/23/2019  Medication Sig  . amLODipine (NORVASC) 5 MG tablet TAKE 1 TABLET BY MOUTH EVERY DAY  . aspirin 81 MG tablet Take 81 mg by mouth every morning.   Marland Kitchen atorvastatin (LIPITOR) 20 MG tablet Take 1 tablet (20 mg total) by mouth daily.  . calcium carbonate (OS-CAL) 600 MG TABS Take 600 mg by mouth every evening.   . Cholecalciferol (VITAMIN D-3) 5000 UNITS TABS Take 5,000 Units by mouth every evening.   . clonazePAM (KLONOPIN) 0.5 MG tablet Take 1 tablet (0.5 mg total) by mouth 2 (two) times daily as needed for anxiety.  Marland Kitchen escitalopram (  LEXAPRO) 20 MG tablet TAKE 1 TABLET BY MOUTH EVERY DAY IN THE MORNING  . fluticasone (FLONASE) 50 MCG/ACT nasal spray SPRAY 2 SPRAYS INTO EACH NOSTRIL EVERY DAY  . levothyroxine (SYNTHROID) 88 MCG tablet Take 1 tablet (88 mcg total) by mouth daily.  . magnesium 30 MG tablet Take 30 mg by mouth daily.  . Multiple Vitamins-Minerals (CENTRUM SILVER PO) Take 1 tablet by mouth every morning.   Marland Kitchen omeprazole (PRILOSEC) 40 MG capsule TAKE 1 CAPSULE BY MOUTH EVERY DAY  . Riboflavin (VITAMIN  B-2 PO) Take 100 mg by mouth every morning.      Past Surgical History:  Procedure Laterality Date  . COLONOSCOPY    . CYSTOSCOPY  11/08/2011   Procedure: CYSTOSCOPY;  Surgeon: Delice Lesch, MD;  Location: [facility];  Service: Gynecology;  Laterality: N/A;  . DILATION AND CURETTAGE OF UTERUS    . LAPAROSCOPIC HYSTERECTOMY  11/08/2011   Procedure: HYSTERECTOMY TOTAL LAPAROSCOPIC;  Surgeon: Delice Lesch, MD;  Location: [facility];  Service: Gynecology;  Laterality: N/A;  with Bilateral Salpingo-Oophorectomies  . MELANOMA EXCISION     Left Shoulder  . SVD      X 4  . TUBAL LIGATION    . UPPER GASTROINTESTINAL ENDOSCOPY      Family History  Problem Relation Age of Onset  . Dementia Mother   . Brain cancer Father   . Anxiety disorder Brother   . Asthma Son   . Colon cancer Neg Hx   . Breast cancer Neg Hx     New complaints: None today  Social history: Husband just had pacemaker put in yesterday. Has an elderly lady living with her that is on hospice and has livedlonger then they thought she would.  Controlled substance contract: will have new contract signed at next fae to face visit.    Review of Systems  Constitutional: Negative for diaphoresis and weight loss.  Eyes: Negative for blurred vision, double vision and pain.  Respiratory: Negative for shortness of breath.   Cardiovascular: Negative for chest pain, palpitations, orthopnea and leg swelling.  Gastrointestinal: Negative for abdominal pain.  Skin: Negative for rash.  Neurological: Negative for dizziness, sensory change, loss of consciousness, weakness and headaches.  Endo/Heme/Allergies: Negative for polydipsia. Does not bruise/bleed easily.  Psychiatric/Behavioral: Negative for memory loss. The patient does not have insomnia.   All other systems reviewed and are negative.    Observations/Objective: Alert and oriented- answers all questions appropriately No distress    Assessment and Plan: Peggy Smith comes in today with chief complaint of Medical Management of Chronic Issues   Diagnosis and orders addressed:  1. Essential hypertension Low sodium diet - amLODipine (NORVASC) 5 MG tablet; TAKE 1 TABLET BY MOUTH EVERY DAY  Dispense: 90 tablet; Refill: 1  2. Pure hypercholesterolemia Low fat diet  3. Gastroesophageal reflux disease without esophagitis Avoid spicy foods Do not eat 2 hours prior to bedtime - omeprazole (PRILOSEC) 40 MG capsule; Take 1 capsule (40 mg total) by mouth daily.  Dispense: 90 capsule; Refill: 1  4. Hypothyroidism, unspecified type - levothyroxine (SYNTHROID) 88 MCG tablet; Take 1 tablet (88 mcg total) by mouth daily.  Dispense: 90 tablet; Refill: 1  5. Panic attacks Stress management - escitalopram (LEXAPRO) 20 MG tablet; TAKE 1 TABLET BY MOUTH EVERY DAY IN THE MORNING  Dispense: 90 tablet; Refill: 1  6. Anxiety state - clonazePAM (KLONOPIN) 0.5 MG tablet; Take 1 tablet (0.5 mg total) by mouth 2 (two) times  daily as needed for anxiety.  Dispense: 60 tablet; Refill: 5  7. Iron deficiency anemia, unspecified iron deficiency anemia type Continue daily iron supplement  8. Malignant melanoma, unspecified site [facility]) Keep follow up with dermatology  9. Smoking Smoking cessation a sneeded  10. BMI 29.0-29.9,adult Discussed diet and exercise for person with BMI >25 Will recheck weight in 3-6 months  11. Seasonal allergic rhinitis due to pollen - fluticasone (FLONASE) 50 MCG/ACT nasal spray; SPRAY 2 SPRAYS INTO EACH NOSTRIL EVERY DAY  Dispense: 48 mL; Refill: 1   Labs pending Health Maintenance reviewed Diet and exercise encouraged  Follow up plan: 3 months     I discussed the assessment and treatment plan with the patient. The patient was provided an opportunity to ask questions and all were answered. The patient agreed with the plan and demonstrated an understanding of the instructions.   The patient was advised to call back or seek an  in-person evaluation if the symptoms worsen or if the condition fails to improve as anticipated.  The above assessment and management plan was discussed with the patient. The patient verbalized understanding of and has agreed to the management plan. Patient is aware to call the clinic if symptoms persist or worsen. Patient is aware when to return to the clinic for a follow-up visit. Patient educated on when it is appropriate to go to the emergency department.   Time call ended:  11:00  I provided 15 minutes of non-face-to-face time during this encounter.    [name] Hassell Done, FNP

## 2019-10-11 ENCOUNTER — Ambulatory Visit: Payer: Medicare PPO | Attending: Internal Medicine

## 2019-10-11 DIAGNOSIS — Z23 Encounter for immunization: Secondary | ICD-10-CM

## 2019-10-11 NOTE — Progress Notes (Signed)
   Covid-19 Vaccination Clinic  Name:  Peggy Smith    MRN: [id number] DOB: 03-Feb-1946  10/11/2019  Ms. Peggy Smith was observed post Covid-19 immunization for 15 minutes without incident. She was provided with Vaccine Information Sheet and instruction to access the V-Safe system.   Ms. Peggy Smith was instructed to call 911 with any severe reactions post vaccine: Marland Kitchen Difficulty breathing  . Swelling of face and throat  . A fast heartbeat  . A bad rash all over body  . Dizziness and weakness   Immunizations Administered    Name Date Dose VIS Date Route   Moderna COVID-19 Vaccine 10/11/2019 12:02 PM 0.5 mL 07/06/2019 Intramuscular   Manufacturer: Moderna   Lot: [id number]   [id number]

## 2019-12-22 ENCOUNTER — Ambulatory Visit: Payer: Medicare PPO | Admitting: Nurse Practitioner

## 2019-12-22 ENCOUNTER — Other Ambulatory Visit: Payer: Self-pay

## 2019-12-22 ENCOUNTER — Encounter: Payer: Self-pay | Admitting: Nurse Practitioner

## 2019-12-22 VITALS — BP 123/73 | HR 78 | Temp 97.9°F | Resp 20 | Ht 69.0 in | Wt 192.0 lb

## 2019-12-22 DIAGNOSIS — Z6829 Body mass index (BMI) 29.0-29.9, adult: Secondary | ICD-10-CM | POA: Diagnosis not present

## 2019-12-22 DIAGNOSIS — E039 Hypothyroidism, unspecified: Secondary | ICD-10-CM

## 2019-12-22 DIAGNOSIS — F17200 Nicotine dependence, unspecified, uncomplicated: Secondary | ICD-10-CM | POA: Diagnosis not present

## 2019-12-22 DIAGNOSIS — F410 Panic disorder [episodic paroxysmal anxiety] without agoraphobia: Secondary | ICD-10-CM | POA: Diagnosis not present

## 2019-12-22 DIAGNOSIS — E7800 Pure hypercholesterolemia, unspecified: Secondary | ICD-10-CM

## 2019-12-22 DIAGNOSIS — F411 Generalized anxiety disorder: Secondary | ICD-10-CM

## 2019-12-22 DIAGNOSIS — D509 Iron deficiency anemia, unspecified: Secondary | ICD-10-CM | POA: Diagnosis not present

## 2019-12-22 DIAGNOSIS — K219 Gastro-esophageal reflux disease without esophagitis: Secondary | ICD-10-CM

## 2019-12-22 DIAGNOSIS — I10 Essential (primary) hypertension: Secondary | ICD-10-CM | POA: Diagnosis not present

## 2019-12-22 MED ORDER — ATORVASTATIN CALCIUM 20 MG PO TABS: 20.0000 mg | ORAL_TABLET | Freq: Every day | ORAL | 1 refills | Status: DC

## 2019-12-22 MED ORDER — OMEPRAZOLE 40 MG PO CPDR: 40.0000 mg | DELAYED_RELEASE_CAPSULE | Freq: Every day | ORAL | 1 refills | Status: DC

## 2019-12-22 MED ORDER — CLONAZEPAM 0.5 MG PO TABS: 0.5000 mg | ORAL_TABLET | Freq: Two times a day (BID) | ORAL | 5 refills | Status: DC | PRN

## 2019-12-22 MED ORDER — ESCITALOPRAM OXALATE 20 MG PO TABS: ORAL_TABLET | ORAL | 1 refills | Status: DC

## 2019-12-22 MED ORDER — AMLODIPINE BESYLATE 5 MG PO TABS: ORAL_TABLET | ORAL | 1 refills | Status: DC

## 2019-12-22 MED ORDER — LEVOTHYROXINE SODIUM 88 MCG PO TABS: 88.0000 ug | ORAL_TABLET | Freq: Every day | ORAL | 1 refills | Status: DC

## 2019-12-22 NOTE — Patient Instructions (Signed)
Stress, Adult Stress is a normal reaction to life events. Stress is what you feel when life demands more than you are used to, or more than you think you can handle. Some stress can be useful, such as studying for a test or meeting a deadline at work. Stress that occurs too often or for too long can cause problems. It can affect your emotional health and interfere with relationships and normal daily activities. Too much stress can weaken your body's defense system (immune system) and increase your risk for physical illness. If you already have a medical problem, stress can make it worse. What are the causes? All sorts of life events can cause stress. An event that causes stress for one person may not be stressful for another person. Major life events, whether positive or negative, commonly cause stress. Examples include:  Losing a job or starting a new job.  Losing a loved one.  Moving to a new town or home.  Getting married or divorced.  Having a baby.  Getting injured or sick. Less obvious life events can also cause stress, especially if they occur day after day or in combination with each other. Examples include:  Working long hours.  Driving in traffic.  Caring for children.  Being in debt.  Being in a difficult relationship. What are the signs or symptoms? Stress can cause emotional symptoms, including:  Anxiety. This is feeling worried, afraid, on edge, overwhelmed, or out of control.  Anger, including irritation or impatience.  Depression. This is feeling sad, down, helpless, or guilty.  Trouble focusing, remembering, or making decisions. Stress can cause physical symptoms, including:  Aches and pains. These may affect your head, neck, back, stomach, or other areas of your body.  Tight muscles or a clenched jaw.  Low energy.  Trouble sleeping. Stress can cause unhealthy behaviors, including:  Eating to feel better (overeating) or skipping meals.  Working too  much or putting off tasks.  Smoking, drinking alcohol, or using drugs to feel better. How is this diagnosed? Stress is diagnosed through an assessment by your health care provider. He or she may diagnose this condition based on:  Your symptoms and any stressful life events.  Your medical history.  Tests to rule out other causes of your symptoms. Depending on your condition, your health care provider may refer you to a specialist for further evaluation. How is this treated?  Stress management techniques are the recommended treatment for stress. Medicine is not typically recommended for the treatment of stress. Techniques to reduce your reaction to stressful life events include:  Stress identification. Monitor yourself for symptoms of stress and identify what causes stress for you. These skills may help you to avoid or prepare for stressful events.  Time management. Set your priorities, keep a calendar of events, and learn to say no. Taking these actions can help you avoid making too many commitments. Techniques for coping with stress include:  Rethinking the problem. Try to think realistically about stressful events rather than ignoring them or overreacting. Try to find the positives in a stressful situation rather than focusing on the negatives.  Exercise. Physical exercise can release both physical and emotional tension. The key is to find a form of exercise that you enjoy and do it regularly.  Relaxation techniques. These relax the body and mind. The key is to find one or more that you enjoy and use the techniques regularly. Examples include: ? Meditation, deep breathing, or progressive relaxation techniques. ? Yoga or   tai chi. ? Biofeedback, mindfulness techniques, or journaling. ? Listening to music, being out in nature, or participating in other hobbies.  Practicing a healthy lifestyle. Eat a balanced diet, drink plenty of water, limit or avoid caffeine, and get plenty of  sleep.  Having a strong support network. Spend time with family, friends, or other people you enjoy being around. Express your feelings and talk things over with someone you trust. Counseling or talk therapy with a mental health professional may be helpful if you are having trouble managing stress on your own. Follow these instructions at home: Lifestyle   Avoid drugs.  Do not use any products that contain nicotine or tobacco, such as cigarettes, e-cigarettes, and chewing tobacco. If you need help quitting, ask your health care provider.  Limit alcohol intake to no more than 1 drink a day for nonpregnant women and 2 drinks a day for men. One drink equals 12 oz of beer, 5 oz of wine, or 1 oz of hard liquor  Do not use alcohol or drugs to relax.  Eat a balanced diet that includes fresh fruits and vegetables, whole grains, lean meats, fish, eggs, and beans, and low-fat dairy. Avoid processed foods and foods high in added fat, sugar, and salt.  Exercise at least 30 minutes on 5 or more days each week.  Get 7-8 hours of sleep each night. General instructions   Practice stress management techniques as discussed with your health care provider.  Drink enough fluid to keep your urine clear or pale yellow.  Take over-the-counter and prescription medicines only as told by your health care provider.  Keep all follow-up visits as told by your health care provider. This is important. Contact a health care provider if:  Your symptoms get worse.  You have new symptoms.  You feel overwhelmed by your problems and can no longer manage them on your own. Get help right away if:  You have thoughts of hurting yourself or others. If you ever feel like you may hurt yourself or others, or have thoughts about taking your own life, get help right away. You can go to your nearest emergency department or call:  Your local emergency services (911 in the [place]  A suicide crisis helpline, such as the  [place] at (316) 250-6172. This is open 24 hours a day. Summary  Stress is a normal reaction to life events. It can cause problems if it happens too often or for too long.  Practicing stress management techniques is the best way to treat stress.  Counseling or talk therapy with a mental health professional may be helpful if you are having trouble managing stress on your own. This information is not intended to replace advice given to you by your health care provider. Make sure you discuss any questions you have with your health care provider. Document Revised: 02/19/2019 Document Reviewed: 09/11/2016 Elsevier Patient Education  [place].

## 2019-12-22 NOTE — Progress Notes (Addendum)
Subjective:    Patient ID: Peggy Smith, female    DOB: 1945-10-19, 74 y.o.   MRN: [id number]   Chief Complaint: Medical Management of Chronic Issues    HPI:  1. Essential hypertension no c/o chest pain , sob or headaches. Doe n ot check blood pressure at home. BP Readings from Last 3 Encounters:  12/22/19 123/73  03/23/19 99/69  10/12/18 116/76     2. Pure hypercholesterolemia Trying to watch diet and does little to no exercise. Lab Results  Component Value Date   CHOL 191 03/23/2019   HDL 45 03/23/2019   LDLCALC 116 (H) 03/23/2019   TRIG 150 (H) 03/23/2019   CHOLHDL 4.2 03/23/2019     3. Gastroesophageal reflux disease without esophagitis Is on omeprazole daily an di doing well.  4. Hypothyroidism, unspecified type np problems that aware of  5. Anxiety state Patient is a very anxious person. Has been her entire life. GAD 7 : Generalized Anxiety Score 12/22/2019 09/23/2019 12/05/2017 11/29/2016  Nervous, Anxious, on Edge 0 2 2 1   Control/stop worrying 1 2 3  0  Worry too much - different things 1 2 3  0  Trouble relaxing 0 1 2 0  Restless 0 1 1 1   Easily annoyed or irritable 0 1 0 0  Afraid - awful might happen 0 1 2 0  Total GAD 7 Score 2 10 13 2   Anxiety Difficulty Not difficult at all Somewhat difficult Somewhat difficult Not difficult at all      6. Panic attacks Has npt had any recent panic attacks. She ha not been out of the house much to get anxious.  7. Iron deficiency anemia, unspecified iron deficiency anemia type No c/o fatigue Lab Results  Component Value Date   HGB 12.9 12/05/2017     8. Smoking Smokes around 1/2 pack a day but does not smoke the entire cigarette  9. BMI 29.0-29.9,adult No recent weight change    Outpatient Encounter Medications as of 12/22/2019  Medication Sig  . amLODipine (NORVASC) 5 MG tablet TAKE 1 TABLET BY MOUTH EVERY DAY  . aspirin 81 MG tablet Take 81 mg by mouth every morning.   Marland Kitchen atorvastatin  (LIPITOR) 20 MG tablet Take 1 tablet (20 mg total) by mouth daily.  . calcium carbonate (OS-CAL) 600 MG TABS Take 600 mg by mouth every evening.   . Cholecalciferol (VITAMIN D-3) 5000 UNITS TABS Take 5,000 Units by mouth every evening.   . clonazePAM (KLONOPIN) 0.5 MG tablet Take 1 tablet (0.5 mg total) by mouth 2 (two) times daily as needed for anxiety.  Marland Kitchen escitalopram (LEXAPRO) 20 MG tablet TAKE 1 TABLET BY MOUTH EVERY DAY IN THE MORNING  . fluticasone (FLONASE) 50 MCG/ACT nasal spray SPRAY 2 SPRAYS INTO EACH NOSTRIL EVERY DAY  . levothyroxine (SYNTHROID) 88 MCG tablet Take 1 tablet (88 mcg total) by mouth daily.  . magnesium 30 MG tablet Take 30 mg by mouth daily.  . Multiple Vitamins-Minerals (CENTRUM SILVER PO) Take 1 tablet by mouth every morning.   Marland Kitchen omeprazole (PRILOSEC) 40 MG capsule Take 1 capsule (40 mg total) by mouth daily.  . Riboflavin (VITAMIN B-2 PO) Take 100 mg by mouth every morning.      Past Surgical History:  Procedure Laterality Date  . COLONOSCOPY    . CYSTOSCOPY  11/08/2011   Procedure: CYSTOSCOPY;  Surgeon: Delice Lesch, MD;  Location: [facility];  Service: Gynecology;  Laterality: N/A;  . DILATION AND CURETTAGE OF UTERUS    .  LAPAROSCOPIC HYSTERECTOMY  11/08/2011   Procedure: HYSTERECTOMY TOTAL LAPAROSCOPIC;  Surgeon: Delice Lesch, MD;  Location: [facility];  Service: Gynecology;  Laterality: N/A;  with Bilateral Salpingo-Oophorectomies  . MELANOMA EXCISION     Left Shoulder  . SVD      X 4  . TUBAL LIGATION    . UPPER GASTROINTESTINAL ENDOSCOPY      Family History  Problem Relation Age of Onset  . Dementia Mother   . Brain cancer Father   . Anxiety disorder Brother   . Asthma Son   . Colon cancer Neg Hx   . Breast cancer Neg Hx     New complaints: None today  Social history: Lives with her husband who is sick  Controlled substance contract: 12/22/19    Review of Systems  Constitutional: Negative for diaphoresis.  Eyes: Negative for pain.    Respiratory: Negative for shortness of breath.   Cardiovascular: Negative for chest pain, palpitations and leg swelling.  Gastrointestinal: Negative for abdominal pain.  Endocrine: Negative for polydipsia.  Skin: Negative for rash.  Neurological: Negative for dizziness, weakness and headaches.  Hematological: Does not bruise/bleed easily.  All other systems reviewed and are negative.      Objective:   Physical Exam Vitals and nursing note reviewed.  Constitutional:      General: She is not in acute distress.    Appearance: Normal appearance. She is well-developed.  HENT:     Head: Normocephalic.     Nose: Nose normal.  Eyes:     Pupils: Pupils are equal, round, and reactive to light.  Neck:     Vascular: No carotid bruit or JVD.  Cardiovascular:     Rate and Rhythm: Normal rate and regular rhythm.     Heart sounds: Normal heart sounds.  Pulmonary:     Effort: Pulmonary effort is normal. No respiratory distress.     Breath sounds: Normal breath sounds. No wheezing or rales.  Chest:     Chest wall: No tenderness.  Abdominal:     General: Bowel sounds are normal. There is no distension or abdominal bruit.     Palpations: Abdomen is soft. There is no hepatomegaly, splenomegaly, mass or pulsatile mass.     Tenderness: There is no abdominal tenderness.  Musculoskeletal:        General: Normal range of motion.     Cervical back: Normal range of motion and neck supple.  Lymphadenopathy:     Cervical: No cervical adenopathy.  Skin:    General: Skin is warm and dry.  Neurological:     Mental Status: She is alert and oriented to person, place, and time.     Deep Tendon Reflexes: Reflexes are normal and symmetric.  Psychiatric:        Behavior: Behavior normal.        Thought Content: Thought content normal.        Judgment: Judgment normal.    BP 123/73   Pulse 78   Temp 97.9 F (36.6 C) (Temporal)   Resp 20   Ht 5' 9"  (1.753 m)   Wt 192 lb (87.1 kg)   SpO2 95%    BMI 28.35 kg/m         Assessment & Plan:  Peggy Smith comes in today with chief complaint of Medical Management of Chronic Issues   Diagnosis and orders addressed:  1. Essential hypertension Low odium diet - amLODipine (NORVASC) 5 MG tablet; TAKE 1 TABLET BY MOUTH EVERY  DAY  Dispense: 90 tablet; Refill: 1  2. Pure hypercholesterolemia Low fat diet - atorvastatin (LIPITOR) 20 MG tablet; Take 1 tablet (20 mg total) by mouth daily.  Dispense: 90 tablet; Refill: 1  3. Gastroesophageal reflux disease without esophagitis Avoid spicy foods Do not eat 2 hours prior to bedtime - omeprazole (PRILOSEC) 40 MG capsule; Take 1 capsule (40 mg total) by mouth daily.  Dispense: 90 capsule; Refill: 1  4. Hypothyroidism, unspecified type Labs pending - levothyroxine (SYNTHROID) 88 MCG tablet; Take 1 tablet (88 mcg total) by mouth daily.  Dispense: 90 tablet; Refill: 1  5. Anxiety state stress management - clonazePAM (KLONOPIN) 0.5 MG tablet; Take 1 tablet (0.5 mg total) by mouth 2 (two) times daily as needed for anxiety.  Dispense: 60 tablet; Refill: 5  6. Panic attacks - escitalopram (LEXAPRO) 20 MG tablet; TAKE 1 TABLET BY MOUTH EVERY DAY IN THE MORNING  Dispense: 90 tablet; Refill: 1  7. Iron deficiency anemia, unspecified iron deficiency anemia type Lab pending  8. Smoking smoking cessation encourgaed  9. BMI 29.0-29.9,adult Discussed diet and exercise for person with BMI >25 Will recheck weight in 3-6 months  Orders Placed This Encounter  Procedures  . CBC with Differential/Platelet  . CMP14+EGFR  . Lipid panel  . Thyroid Panel With TSH     Labs pending Health Maintenance reviewed Diet and exercise encouraged  Follow up plan: 6 months   [place], FNP

## 2019-12-22 NOTE — Addendum Note (Signed)
Addended by: Chevis Pretty on: 12/22/2019 12:37 PM   Modules accepted: Orders

## 2019-12-23 LAB — THYROID PANEL WITH TSH
Free Thyroxine Index: 1.7 (ref 1.2–4.9)
T3 Uptake Ratio: 23 % — ABNORMAL LOW (ref 24–39)
T4, Total: 7.4 ug/dL (ref 4.5–12.0)
TSH: 2.15 u[IU]/mL (ref 0.450–4.500)

## 2019-12-23 LAB — CBC WITH DIFFERENTIAL/PLATELET
Basophils Absolute: 0.1 10*3/uL (ref 0.0–0.2)
Basos: 1 %
EOS (ABSOLUTE): 0.2 10*3/uL (ref 0.0–0.4)
Eos: 3 %
Hematocrit: 40.3 % (ref 34.0–46.6)
Hemoglobin: 12.8 g/dL (ref 11.1–15.9)
Immature Grans (Abs): 0 10*3/uL (ref 0.0–0.1)
Immature Granulocytes: 0 %
Lymphocytes Absolute: 2.8 10*3/uL (ref 0.7–3.1)
Lymphs: 32 %
MCH: 26.9 pg (ref 26.6–33.0)
MCHC: 31.8 g/dL (ref 31.5–35.7)
MCV: 85 fL (ref 79–97)
Monocytes Absolute: 0.7 10*3/uL (ref 0.1–0.9)
Monocytes: 8 %
Neutrophils Absolute: 4.9 10*3/uL (ref 1.4–7.0)
Neutrophils: 56 %
Platelets: 397 10*3/uL (ref 150–450)
RBC: 4.75 x10E6/uL (ref 3.77–5.28)
RDW: 13.6 % (ref 11.7–15.4)
WBC: 8.7 10*3/uL (ref 3.4–10.8)

## 2019-12-23 LAB — CMP14+EGFR
ALT: 12 IU/L (ref 0–32)
AST: 16 IU/L (ref 0–40)
Albumin/Globulin Ratio: 1.6 (ref 1.2–2.2)
Albumin: 4.4 g/dL (ref 3.7–4.7)
Alkaline Phosphatase: 110 IU/L (ref 48–121)
BUN/Creatinine Ratio: 17 (ref 12–28)
BUN: 16 mg/dL (ref 8–27)
Bilirubin Total: 0.3 mg/dL (ref 0.0–1.2)
CO2: 26 mmol/L (ref 20–29)
Calcium: 9.6 mg/dL (ref 8.7–10.3)
Chloride: 98 mmol/L (ref 96–106)
Creatinine, Ser: 0.95 mg/dL (ref 0.57–1.00)
GFR calc Af Amer: 68 mL/min/{1.73_m2} (ref >59)
GFR calc non Af Amer: 59 mL/min/{1.73_m2} — ABNORMAL LOW (ref >59)
Globulin, Total: 2.7 g/dL (ref 1.5–4.5)
Glucose: 91 mg/dL (ref 65–99)
Potassium: 4.8 mmol/L (ref 3.5–5.2)
Sodium: 135 mmol/L (ref 134–144)
Total Protein: 7.1 g/dL (ref 6.0–8.5)

## 2019-12-23 LAB — LIPID PANEL
Chol/HDL Ratio: 4 ratio (ref 0.0–4.4)
Cholesterol, Total: 202 mg/dL — ABNORMAL HIGH (ref 100–199)
HDL: 50 mg/dL (ref >39)
LDL Chol Calc (NIH): 125 mg/dL — ABNORMAL HIGH (ref 0–99)
Triglycerides: 152 mg/dL — ABNORMAL HIGH (ref 0–149)
VLDL Cholesterol Cal: 27 mg/dL (ref 5–40)

## 2020-01-10 ENCOUNTER — Ambulatory Visit (INDEPENDENT_AMBULATORY_CARE_PROVIDER_SITE_OTHER): Payer: Medicare PPO | Admitting: *Deleted

## 2020-01-10 DIAGNOSIS — Z0000 Encounter for general adult medical examination without abnormal findings: Secondary | ICD-10-CM

## 2020-01-10 NOTE — Progress Notes (Signed)
MEDICARE ANNUAL WELLNESS VISIT  01/10/2020  Telephone Visit Disclaimer This Medicare AWV was conducted by telephone due to national recommendations for restrictions regarding the COVID-19 Pandemic (e.g. social distancing).  I verified, using two identifiers, that I am speaking with Peggy Smith or their authorized healthcare agent. I discussed the limitations, risks, security, and privacy concerns of performing an evaluation and management service by telephone and the potential availability of an in-person appointment in the future. The patient expressed understanding and agreed to proceed.   Subjective:  Peggy Smith is a 74 y.o. female patient of Chevis Pretty, [place] who had a Medicare Annual Wellness Visit today via telephone. Montanna is Retired and lives with their spouse. she has 4 children. she reports that she is socially active and does interact with friends/family regularly. she is not physically active and enjoys computer games and playing piano.  Patient Care Team: Chevis Pretty, FNP as PCP - General (Family Medicine)  Advanced Directives 01/10/2020 06/29/2018 06/13/2015 10/31/2011 05/17/2011  Does Patient Have a Medical Advance Directive? No No No Patient does not have advance directive Patient does not have advance directive  Does patient want to make changes to medical advance directive? No - Patient declined - - - -  Would patient like information on creating a medical advance directive? No - Patient declined No - Patient declined - - [facility] Utilization Over the Past 12 Months: # of hospitalizations or ER visits: 0 # of surgeries: 0  Review of Systems    Patient reports that her overall health is better compared to last year.  History obtained from chart review and the patient  Patient Reported Readings (BP, Pulse, CBG, Weight, etc) none  Pain Assessment Pain : No/denies pain     Current Medications & Allergies (verified) Allergies as  of 01/10/2020      Reactions   Crestor [rosuvastatin]    Muscle aches   Macrodantin Other (See Comments)   Liver shut down   Sulfa Antibiotics Other (See Comments)   agitation   Codeine Nausea And Vomiting          Medication List       Accurate as of January 10, 2020  1:59 PM. If you have any questions, ask your nurse or doctor.        amLODipine 5 MG tablet Commonly known as: NORVASC TAKE 1 TABLET BY MOUTH EVERY DAY   aspirin 81 MG tablet Take 81 mg by mouth every morning.   atorvastatin 20 MG tablet Commonly known as: LIPITOR Take 1 tablet (20 mg total) by mouth daily. What changed: additional instructions   calcium carbonate 600 MG Tabs tablet Commonly known as: OS-CAL Take 600 mg by mouth every evening.   CENTRUM SILVER PO Take 1 tablet by mouth every morning.   clonazePAM 0.5 MG tablet Commonly known as: KLONOPIN Take 1 tablet (0.5 mg total) by mouth 2 (two) times daily as needed for anxiety.   escitalopram 20 MG tablet Commonly known as: LEXAPRO TAKE 1 TABLET BY MOUTH EVERY DAY IN THE MORNING   fluticasone 50 MCG/ACT nasal spray Commonly known as: FLONASE SPRAY 2 SPRAYS INTO EACH NOSTRIL EVERY DAY   levothyroxine 88 MCG tablet Commonly known as: Synthroid Take 1 tablet (88 mcg total) by mouth daily.   magnesium 30 MG tablet Take 30 mg by mouth daily.   omeprazole 40 MG capsule Commonly known as: PRILOSEC Take 1 capsule (40 mg total) by mouth daily.  VITAMIN B-2 PO Take 100 mg by mouth every morning.   Vitamin D-3 125 MCG (5000 UT) Tabs Take 5,000 Units by mouth every evening.       History (reviewed): Past Medical History:  Diagnosis Date  . Angina    STRESS TEST NORMAL-ANXIETY  . Anxiety   . Cardiac arrhythmia due to congenital heart disease   . Depression   . GERD (gastroesophageal reflux disease)   . Headache(784.0)    OTC MEDS  . Hepatitis     1976  . Hyperlipidemia    DOES NOT TAKE MEDS - HAS THEM AT HOME BUT STATES DOES NOT  TAKE  . Hypertension   . Hypothyroidism   . Iron deficiency anemia   . Melanoma (Roundup)   . Panic attacks   . PONV (postoperative nausea and vomiting)    Past Surgical History:  Procedure Laterality Date  . COLONOSCOPY    . CYSTOSCOPY  11/08/2011   Procedure: CYSTOSCOPY;  Surgeon: Delice Lesch, MD;  Location: [facility];  Service: Gynecology;  Laterality: N/A;  . DILATION AND CURETTAGE OF UTERUS    . LAPAROSCOPIC HYSTERECTOMY  11/08/2011   Procedure: HYSTERECTOMY TOTAL LAPAROSCOPIC;  Surgeon: Delice Lesch, MD;  Location: [facility];  Service: Gynecology;  Laterality: N/A;  with Bilateral Salpingo-Oophorectomies  . MELANOMA EXCISION     Left Shoulder  . SVD      X 4  . TUBAL LIGATION    . UPPER GASTROINTESTINAL ENDOSCOPY     Family History  Problem Relation Age of Onset  . Dementia Mother   . Brain cancer Father   . Anxiety disorder Brother   . Asthma Son   . Colon cancer Neg Hx   . Breast cancer Neg Hx    Social History   Socioeconomic History  . Marital status: Married    Spouse name: [name]  . Number of children: 4  . Years of education: Not on file  . Highest education level: Master's degree (e.g., MA, MS, MEng, MEd, MSW, MBA)  Occupational History  . Occupation: paralegal    Employer: [employer]  Tobacco Use  . Smoking status: Current Every Day Smoker    Packs/day: 0.25    Years: 30.00    Pack years: 7.50    Types: Cigarettes  . Smokeless tobacco: Never Used  Substance and Sexual Activity  . Alcohol use: No  . Drug use: No  . Sexual activity: Yes    Birth control/protection: Surgical  Other Topics Concern  . Not on file  Social History Narrative  . Not on file   Social Determinants of Health   Financial Resource Strain:   . Difficulty of Paying Living Expenses:   Food Insecurity:   . Worried About Charity fundraiser in the Last Year:   . Arboriculturist in the Last Year:   Transportation Needs:   . Film/video editor (Medical):   Marland Kitchen Lack  of Transportation (Non-Medical):   Physical Activity: Inactive  . Days of Exercise per Week: 0 days  . Minutes of Exercise per Session: 0 min  Stress:   . Feeling of Stress :   Social Connections: Not Isolated  . Frequency of Communication with Friends and Family: More than three times a week  . Frequency of Social Gatherings with Friends and Family: More than three times a week  . Attends Religious Services: 1 to 4 times per year  . Active Member of Clubs or Organizations: Yes  .  Attends Archivist Meetings: 1 to 4 times per year  . Marital Status: Married    Activities of Daily Living In your present state of health, do you have any difficulty performing the following activities: 01/10/2020  Hearing? N  Vision? Y  Comment wears trifocals  Difficulty concentrating or making decisions? N  Walking or climbing stairs? N  Dressing or bathing? N  Doing errands, shopping? N  Preparing Food and eating ? N  Using the Toilet? N  In the past six months, have you accidently leaked urine? N  Do you have problems with loss of bowel control? N  Managing your Medications? N  Managing your Finances? N  Housekeeping or managing your Housekeeping? Y  Comment has a housekeeper by choice  Some recent data might be hidden    Patient Education/ Literacy How often do you need to have someone help you when you read instructions, pamphlets, or other written materials from your doctor or pharmacy?: 1 - Never What is the last grade level you completed in school?: master's  Exercise Current Exercise Habits: The patient does not participate in regular exercise at present, Exercise limited by: None identified  Diet Patient reports consuming 1 meals a day and 2 snack(s) a day Patient reports that her primary diet is: Regular Patient reports that she does have regular access to food.   Depression Screen PHQ 2/9 Scores 01/10/2020 12/22/2019 03/23/2019 12/18/2018 10/12/2018 06/29/2018 06/19/2018    PHQ - 2 Score 0 0 0 0 0 0 0     Fall Risk Fall Risk  01/10/2020 12/22/2019 03/23/2019 10/12/2018 06/29/2018  Falls in the past year? 0 0 0 0 0  Number falls in past yr: - - - - -  Injury with Fall? - - - - -  Comment - - - - -     Objective:  Peggy Smith seemed alert and oriented and she participated appropriately during our telephone visit.  Blood Pressure Weight BMI  BP Readings from Last 3 Encounters:  12/22/19 123/73  03/23/19 99/69  10/12/18 116/76   Wt Readings from Last 3 Encounters:  12/22/19 192 lb (87.1 kg)  03/23/19 198 lb (89.8 kg)  10/12/18 200 lb (90.7 kg)   BMI Readings from Last 1 Encounters:  12/22/19 28.35 kg/m    *Unable to obtain current vital signs, weight, and BMI due to telephone visit type  Hearing/Vision  . Beauty did not seem to have difficulty with hearing/understanding during the telephone conversation . Reports that she has had a formal eye exam by an eye care professional within the past year . Reports that she has not had a formal hearing evaluation within the past year *Unable to fully assess hearing and vision during telephone visit type  Cognitive Function: 6CIT Screen 01/10/2020  What Year? 0 points  What month? 0 points  What time? 0 points  Count back from 20 0 points  Months in reverse 0 points  Repeat phrase 0 points  Total Score 0   (Normal:0-7, Significant for Dysfunction: >8)  Normal Cognitive Function Screening: Yes   Immunization & Health Maintenance Record Immunization History  Administered Date(s) Administered  . Influenza-Unspecified 07/16/2011, 06/15/2012  . Moderna SARS-COVID-2 Vaccination 09/09/2019, 09/09/2019, 09/09/2019, 09/09/2019, 10/11/2019  . Tdap 11/09/2013    Health Maintenance  Topic Date Due  . INFLUENZA VACCINE  03/05/2020  . MAMMOGRAM  07/31/2020  . COLONOSCOPY  03/28/2021  . TETANUS/TDAP  11/10/2023  . DEXA SCAN  Completed  . COVID-19  Vaccine  Completed  . Hepatitis C Screening  Completed   . PNA vac Low Risk Adult  Discontinued       Assessment  This is a routine wellness examination for Peggy Smith.  Health Maintenance: Due or Overdue There are no preventive care reminders to display for this patient.  Peggy Smith does not need a referral for Community Assistance: Care Management:   no Social Work:    no Prescription Assistance:  no Nutrition/Diabetes Education:  no   Plan:  Personalized Goals Goals Addressed            This Visit's Progress   . DIET - EAT MORE FRUITS AND VEGETABLES   Not on track    Increase fruit and vegetables intake to 3-5 servings per day.     Marland Kitchen DIET - REDUCE FAST FOOD INTAKE        Personalized Health Maintenance & Screening Recommendations  Smoking cessation counseling Advanced directives: has NO advanced directive - not interested in additional information  Lung Cancer Screening Recommended: yes (Low Dose CT Chest recommended if Age 53-80 years, 30 pack-year currently smoking OR have quit w/in past 15 years) Hepatitis C Screening recommended: no HIV Screening recommended: no  Advanced Directives: Written information was not prepared per patient's request.  Referrals & Orders No orders of the defined types were placed in this encounter.   Follow-up Plan . Follow-up with Chevis Pretty, FNP as planned on 06/23/20 . Discuss Chest CT with pt, due to smoking for 30+ years.  Pt has had both covid vaccines Pt has denied any pneumonia vaccines. Pt does not have any advanced directives and declined any information. Patient does not eat well. She usually has one big meal a day and snacks all day. She also eats out a lot. Her goal is to eat better. Pt has no healthcare concerns at this time AvS printed and mailed to patient I have personally reviewed and noted the following in the patient's chart:   . Medical and social history . Use of alcohol, tobacco or illicit drugs  . Current medications and  supplements . Functional ability and status . Nutritional status . Physical activity . Advanced directives . List of other physicians . Hospitalizations, surgeries, and ER visits in previous 12 months . Vitals . Screenings to include cognitive, depression, and falls . Referrals and appointments  In addition, I have reviewed and discussed with Peggy Smith certain preventive protocols, quality metrics, and best practice recommendations. A written personalized care plan for preventive services as well as general preventive health recommendations is available and can be mailed to the patient at her request.      Rana Snare, LPN  12/08/2128

## 2020-05-03 ENCOUNTER — Other Ambulatory Visit: Payer: Self-pay | Admitting: Nurse Practitioner

## 2020-05-03 DIAGNOSIS — J301 Allergic rhinitis due to pollen: Secondary | ICD-10-CM

## 2020-06-23 ENCOUNTER — Encounter: Payer: Self-pay | Admitting: Nurse Practitioner

## 2020-06-23 ENCOUNTER — Other Ambulatory Visit: Payer: Self-pay

## 2020-06-23 ENCOUNTER — Ambulatory Visit: Payer: Medicare PPO | Admitting: Nurse Practitioner

## 2020-06-23 VITALS — BP 120/78 | HR 74 | Temp 97.8°F | Resp 20 | Ht 68.5 in | Wt 191.0 lb

## 2020-06-23 DIAGNOSIS — E039 Hypothyroidism, unspecified: Secondary | ICD-10-CM | POA: Diagnosis not present

## 2020-06-23 DIAGNOSIS — Z6829 Body mass index (BMI) 29.0-29.9, adult: Secondary | ICD-10-CM | POA: Diagnosis not present

## 2020-06-23 DIAGNOSIS — IMO0001 Reserved for inherently not codable concepts without codable children: Secondary | ICD-10-CM

## 2020-06-23 DIAGNOSIS — K219 Gastro-esophageal reflux disease without esophagitis: Secondary | ICD-10-CM

## 2020-06-23 DIAGNOSIS — F410 Panic disorder [episodic paroxysmal anxiety] without agoraphobia: Secondary | ICD-10-CM | POA: Diagnosis not present

## 2020-06-23 DIAGNOSIS — I10 Essential (primary) hypertension: Secondary | ICD-10-CM | POA: Diagnosis not present

## 2020-06-23 DIAGNOSIS — F411 Generalized anxiety disorder: Secondary | ICD-10-CM

## 2020-06-23 DIAGNOSIS — F17200 Nicotine dependence, unspecified, uncomplicated: Secondary | ICD-10-CM

## 2020-06-23 DIAGNOSIS — D509 Iron deficiency anemia, unspecified: Secondary | ICD-10-CM

## 2020-06-23 DIAGNOSIS — E7800 Pure hypercholesterolemia, unspecified: Secondary | ICD-10-CM | POA: Diagnosis not present

## 2020-06-23 LAB — CBC WITH DIFFERENTIAL/PLATELET
Basophils Absolute: 0.1 10*3/uL (ref 0.0–0.2)
Basos: 1 %
EOS (ABSOLUTE): 0.2 10*3/uL (ref 0.0–0.4)
Eos: 2 %
Hematocrit: 39.3 % (ref 34.0–46.6)
Hemoglobin: 12.9 g/dL (ref 11.1–15.9)
Immature Grans (Abs): 0 10*3/uL (ref 0.0–0.1)
Immature Granulocytes: 0 %
Lymphocytes Absolute: 2.3 10*3/uL (ref 0.7–3.1)
Lymphs: 28 %
MCH: 27.3 pg (ref 26.6–33.0)
MCHC: 32.8 g/dL (ref 31.5–35.7)
MCV: 83 fL (ref 79–97)
Monocytes Absolute: 0.6 10*3/uL (ref 0.1–0.9)
Monocytes: 7 %
Neutrophils Absolute: 4.9 10*3/uL (ref 1.4–7.0)
Neutrophils: 62 %
Platelets: 425 10*3/uL (ref 150–450)
RBC: 4.72 x10E6/uL (ref 3.77–5.28)
RDW: 13.3 % (ref 11.7–15.4)
WBC: 8 10*3/uL (ref 3.4–10.8)

## 2020-06-23 MED ORDER — ESCITALOPRAM OXALATE 20 MG PO TABS: ORAL_TABLET | ORAL | 1 refills | Status: DC

## 2020-06-23 MED ORDER — ATORVASTATIN CALCIUM 20 MG PO TABS: 20.0000 mg | ORAL_TABLET | Freq: Every day | ORAL | 3 refills | Status: DC

## 2020-06-23 MED ORDER — OMEPRAZOLE 40 MG PO CPDR: 40.0000 mg | DELAYED_RELEASE_CAPSULE | Freq: Every day | ORAL | 1 refills | Status: DC

## 2020-06-23 MED ORDER — AMLODIPINE BESYLATE 5 MG PO TABS: ORAL_TABLET | ORAL | 1 refills | Status: DC

## 2020-06-23 MED ORDER — MAGNESIUM 30 MG PO TABS: 30.0000 mg | ORAL_TABLET | Freq: Every day | ORAL | 1 refills | Status: DC

## 2020-06-23 MED ORDER — LEVOTHYROXINE SODIUM 88 MCG PO TABS: 88.0000 ug | ORAL_TABLET | Freq: Every day | ORAL | 1 refills | Status: DC

## 2020-06-23 MED ORDER — CLONAZEPAM 0.5 MG PO TABS: 0.5000 mg | ORAL_TABLET | Freq: Two times a day (BID) | ORAL | 5 refills | Status: DC | PRN

## 2020-06-23 NOTE — Progress Notes (Signed)
Subjective:    Patient ID: Peggy Smith, female    DOB: 1945/08/16, 74 y.o.   MRN: [id number]   Chief Complaint: Medical Management of Chronic Issues    HPI:  1. Primary hypertension No c/o chest pain, sob or headache. Does not check blodpressure at home. BP Readings from Last 3 Encounters:  06/23/20 120/78  12/22/19 123/73  03/23/19 99/69     2. Pure hypercholesterolemia Does watch diet and stays active. Lab Results  Component Value Date   CHOL 202 (H) 12/22/2019   HDL 50 12/22/2019   LDLCALC 125 (H) 12/22/2019   TRIG 152 (H) 12/22/2019   CHOLHDL 4.0 12/22/2019     3. Gastroesophageal reflux disease without esophagitis Is on omeprazole daily and is doing well.  4. Hypothyroidism, unspecified type No problems that she is aware of  5. Iron deficiency anemia, unspecified iron deficiency anemia type No c/o fatigue Lab Results  Component Value Date   HGB 12.8 12/22/2019     6. Anxiety state Patient has always had issues with anxiety. She worries about everything. Her husband has pulmonary fibrosis and is worsening daily. That increases he ranxiety. GAD 7 : Generalized Anxiety Score 06/23/2020 12/22/2019 09/23/2019 12/05/2017  Nervous, Anxious, on Edge 3 0 2 2  Control/stop worrying _0 Worry too much - different things _1 Trouble relaxing 1 0 1 2  Restless 0 0 1 1  Easily annoyed or irritable 0 0 1 0  Afraid - awful might happen 0 0 1 2  Total GAD 7 Score _2 Anxiety Difficulty Not difficult at all Not difficult at all Somewhat difficult Somewhat difficult      7. Panic attacks No recent panic attacks because she really doe snot go many places.  8. Smoking Not a good time for her to quit smoking. It helps calm her  9. BMI 29.0-29.9,adult No recent weight changes Wt Readings from Last 3 Encounters:  06/23/20 191 lb (86.6 kg)  12/22/19 192 lb (87.1 kg)  03/23/19 198 lb (89.8 kg)   BMI Readings from Last 3 Encounters:  06/23/20  28.62 kg/m  12/22/19 28.35 kg/m  03/23/19 29.24 kg/m       Outpatient Encounter Medications as of 06/23/2020  Medication Sig  . amLODipine (NORVASC) 5 MG tablet TAKE 1 TABLET BY MOUTH EVERY DAY  . aspirin 81 MG tablet Take 81 mg by mouth every morning.   Marland Kitchen atorvastatin (LIPITOR) 20 MG tablet Take 1 tablet (20 mg total) by mouth daily. (Patient taking differently: Take 20 mg by mouth daily. Take 1 po twice a week)  . calcium carbonate (OS-CAL) 600 MG TABS Take 600 mg by mouth every evening.   . Cholecalciferol (VITAMIN D-3) 5000 UNITS TABS Take 5,000 Units by mouth every evening.   . clonazePAM (KLONOPIN) 0.5 MG tablet Take 1 tablet (0.5 mg total) by mouth 2 (two) times daily as needed for anxiety.  Marland Kitchen escitalopram (LEXAPRO) 20 MG tablet TAKE 1 TABLET BY MOUTH EVERY DAY IN THE MORNING  . fluticasone (FLONASE) 50 MCG/ACT nasal spray SPRAY 2 SPRAYS INTO EACH NOSTRIL EVERY DAY  . levothyroxine (SYNTHROID) 88 MCG tablet Take 1 tablet (88 mcg total) by mouth daily.  . magnesium 30 MG tablet Take 30 mg by mouth daily.  . Multiple Vitamins-Minerals (CENTRUM SILVER PO) Take 1 tablet by mouth every morning.   Marland Kitchen omeprazole (PRILOSEC) 40 MG capsule Take 1 capsule (40 mg total) by  mouth daily.  . Riboflavin (VITAMIN B-2 PO) Take 100 mg by mouth every morning.      Past Surgical History:  Procedure Laterality Date  . COLONOSCOPY    . CYSTOSCOPY  11/08/2011   Procedure: CYSTOSCOPY;  Surgeon: Delice Lesch, MD;  Location: [facility];  Service: Gynecology;  Laterality: N/A;  . DILATION AND CURETTAGE OF UTERUS    . LAPAROSCOPIC HYSTERECTOMY  11/08/2011   Procedure: HYSTERECTOMY TOTAL LAPAROSCOPIC;  Surgeon: Delice Lesch, MD;  Location: [facility];  Service: Gynecology;  Laterality: N/A;  with Bilateral Salpingo-Oophorectomies  . MELANOMA EXCISION     Left Shoulder  . SVD      X 4  . TUBAL LIGATION    . UPPER GASTROINTESTINAL ENDOSCOPY      Family History  Problem Relation Age of Onset  .  Dementia Mother   . Brain cancer Father   . Anxiety disorder Brother   . Asthma Son   . Colon cancer Neg Hx   . Breast cancer Neg Hx     New complaints: None today  Social history: Lives with her husband  Controlled substance contract: 06/23/20    Review of Systems  Constitutional: Negative for diaphoresis.  Eyes: Negative for pain.  Respiratory: Negative for shortness of breath.   Cardiovascular: Negative for chest pain, palpitations and leg swelling.  Gastrointestinal: Negative for abdominal pain.  Endocrine: Negative for polydipsia.  Skin: Negative for rash.  Neurological: Negative for dizziness, weakness and headaches.  Hematological: Does not bruise/bleed easily.  All other systems reviewed and are negative.      Objective:   Physical Exam Vitals and nursing note reviewed.  Constitutional:      General: She is not in acute distress.    Appearance: Normal appearance. She is well-developed.  HENT:     Head: Normocephalic.     Nose: Nose normal.  Eyes:     Pupils: Pupils are equal, round, and reactive to light.  Neck:     Vascular: No carotid bruit or JVD.  Cardiovascular:     Rate and Rhythm: Normal rate and regular rhythm.     Heart sounds: Normal heart sounds.  Pulmonary:     Effort: Pulmonary effort is normal. No respiratory distress.     Breath sounds: Normal breath sounds. No wheezing or rales.  Chest:     Chest wall: No tenderness.  Abdominal:     General: Bowel sounds are normal. There is no distension or abdominal bruit.     Palpations: Abdomen is soft. There is no hepatomegaly, splenomegaly, mass or pulsatile mass.     Tenderness: There is no abdominal tenderness.  Musculoskeletal:        General: Normal range of motion.     Cervical back: Normal range of motion and neck supple.  Lymphadenopathy:     Cervical: No cervical adenopathy.  Skin:    General: Skin is warm and dry.  Neurological:     Mental Status: She is alert and oriented to  person, place, and time.     Deep Tendon Reflexes: Reflexes are normal and symmetric.  Psychiatric:        Behavior: Behavior normal.        Thought Content: Thought content normal.        Judgment: Judgment normal.     BP 120/78   Pulse 74   Temp 97.8 F (36.6 C) (Temporal)   Resp 20   Ht 5' 8.5" (1.74 m)   Wt  191 lb (86.6 kg)   SpO2 97%   BMI 28.62 kg/m        Assessment & Plan:  OSCAR FORMAN comes in today with chief complaint of Medical Management of Chronic Issues   Diagnosis and orders addressed:  1. Primary hypertension Low sodium diet - amLODipine (NORVASC) 5 MG tablet; TAKE 1 TABLET BY MOUTH EVERY DAY  Dispense: 90 tablet; Refill: 1 - CMP14+EGFR  2. Pure hypercholesterolemia Low fat diet - atorvastatin (LIPITOR) 20 MG tablet; Take 1 tablet (20 mg total) by mouth daily. Take 1 po twice a week  Dispense: 30 tablet; Refill: 3 - Lipid panel  3. Gastroesophageal reflux disease without esophagitis Avoid spicy foods Do not eat 2 hours prior to bedtime - omeprazole (PRILOSEC) 40 MG capsule; Take 1 capsule (40 mg total) by mouth daily.  Dispense: 90 capsule; Refill: 1  4. Hypothyroidism, unspecified type Labs pending - levothyroxine (SYNTHROID) 88 MCG tablet; Take 1 tablet (88 mcg total) by mouth daily.  Dispense: 90 tablet; Refill: 1  5. Iron deficiency anemia, unspecified iron deficiency anemia type Labs pending - CBC with Differential/Platelet  6. Anxiety state Stress management - clonazePAM (KLONOPIN) 0.5 MG tablet; Take 1 tablet (0.5 mg total) by mouth 2 (two) times daily as needed for anxiety.  Dispense: 60 tablet; Refill: 5  7. Panic attacks - escitalopram (LEXAPRO) 20 MG tablet; TAKE 1 TABLET BY MOUTH EVERY DAY IN THE MORNING  Dispense: 90 tablet; Refill: 1  8. Smoking Smoking cessation encourgaed  9. BMI 29.0-29.9,adult Discussed diet and exercise for person with BMI >25 Will recheck weight in 3-6 months    Labs pending Health  Maintenance reviewed Diet and exercise encouraged  Follow up plan: 6 months   [name] Hassell Done, FNP

## 2020-06-23 NOTE — Patient Instructions (Signed)
Stress, Adult °Stress is a normal reaction to life events. Stress is what you feel when life demands more than you are used to, or more than you think you can handle. Some stress can be useful, such as studying for a test or meeting a deadline at work. Stress that occurs too often or for too long can cause problems. It can affect your emotional health and interfere with relationships and normal daily activities. Too much stress can weaken your body's defense system (immune system) and increase your risk for physical illness. If you already have a medical problem, stress can make it worse. °What are the causes? °All sorts of life events can cause stress. An event that causes stress for one person may not be stressful for another person. Major life events, whether positive or negative, commonly cause stress. Examples include: °· Losing a job or starting a new job. °· Losing a loved one. °· Moving to a new town or home. °· Getting married or divorced. °· Having a baby. °· Getting injured or sick. °Less obvious life events can also cause stress, especially if they occur day after day or in combination with each other. Examples include: °· Working long hours. °· Driving in traffic. °· Caring for children. °· Being in debt. °· Being in a difficult relationship. °What are the signs or symptoms? °Stress can cause emotional symptoms, including: °· Anxiety. This is feeling worried, afraid, on edge, overwhelmed, or out of control. °· Anger, including irritation or impatience. °· Depression. This is feeling sad, down, helpless, or guilty. °· Trouble focusing, remembering, or making decisions. °Stress can cause physical symptoms, including: °· Aches and pains. These may affect your head, neck, back, stomach, or other areas of your body. °· Tight muscles or a clenched jaw. °· Low energy. °· Trouble sleeping. °Stress can cause unhealthy behaviors, including: °· Eating to feel better (overeating) or skipping meals. °· Working too  much or putting off tasks. °· Smoking, drinking alcohol, or using drugs to feel better. °How is this diagnosed? °Stress is diagnosed through an assessment by your health care provider. He or she may diagnose this condition based on: °· Your symptoms and any stressful life events. °· Your medical history. °· Tests to rule out other causes of your symptoms. °Depending on your condition, your health care provider may refer you to a specialist for further evaluation. °How is this treated? ° °Stress management techniques are the recommended treatment for stress. Medicine is not typically recommended for the treatment of stress. °Techniques to reduce your reaction to stressful life events include: °· Stress identification. Monitor yourself for symptoms of stress and identify what causes stress for you. These skills may help you to avoid or prepare for stressful events. °· Time management. Set your priorities, keep a calendar of events, and learn to say no. Taking these actions can help you avoid making too many commitments. °Techniques for coping with stress include: °· Rethinking the problem. Try to think realistically about stressful events rather than ignoring them or overreacting. Try to find the positives in a stressful situation rather than focusing on the negatives. °· Exercise. Physical exercise can release both physical and emotional tension. The key is to find a form of exercise that you enjoy and do it regularly. °· Relaxation techniques. These relax the body and mind. The key is to find one or more that you enjoy and use the techniques regularly. Examples include: °? Meditation, deep breathing, or progressive relaxation techniques. °? Yoga or   tai chi. ? Biofeedback, mindfulness techniques, or journaling. ? Listening to music, being out in nature, or participating in other hobbies.  Practicing a healthy lifestyle. Eat a balanced diet, drink plenty of water, limit or avoid caffeine, and get plenty of  sleep.  Having a strong support network. Spend time with family, friends, or other people you enjoy being around. Express your feelings and talk things over with someone you trust. Counseling or talk therapy with a mental health professional may be helpful if you are having trouble managing stress on your own. Follow these instructions at home: Lifestyle   Avoid drugs.  Do not use any products that contain nicotine or tobacco, such as cigarettes, e-cigarettes, and chewing tobacco. If you need help quitting, ask your health care provider.  Limit alcohol intake to no more than 1 drink a day for nonpregnant women and 2 drinks a day for men. One drink equals 12 oz of beer, 5 oz of wine, or 1 oz of hard liquor  Do not use alcohol or drugs to relax.  Eat a balanced diet that includes fresh fruits and vegetables, whole grains, lean meats, fish, eggs, and beans, and low-fat dairy. Avoid processed foods and foods high in added fat, sugar, and salt.  Exercise at least 30 minutes on 5 or more days each week.  Get 7-8 hours of sleep each night. General instructions   Practice stress management techniques as discussed with your health care provider.  Drink enough fluid to keep your urine clear or pale yellow.  Take over-the-counter and prescription medicines only as told by your health care provider.  Keep all follow-up visits as told by your health care provider. This is important. Contact a health care provider if:  Your symptoms get worse.  You have new symptoms.  You feel overwhelmed by your problems and can no longer manage them on your own. Get help right away if:  You have thoughts of hurting yourself or others. If you ever feel like you may hurt yourself or others, or have thoughts about taking your own life, get help right away. You can go to your nearest emergency department or call:  Your local emergency services (911 in the [place]  A suicide crisis helpline, such as the  [facility] at 507-293-2218. This is open 24 hours a day. Summary  Stress is a normal reaction to life events. It can cause problems if it happens too often or for too long.  Practicing stress management techniques is the best way to treat stress.  Counseling or talk therapy with a mental health professional may be helpful if you are having trouble managing stress on your own. This information is not intended to replace advice given to you by your health care provider. Make sure you discuss any questions you have with your health care provider. Document Revised: 02/19/2019 Document Reviewed: 09/11/2016 Elsevier Patient Education  [place].

## 2020-06-24 LAB — THYROID PANEL WITH TSH
Free Thyroxine Index: 2.2 (ref 1.2–4.9)
T3 Uptake Ratio: 25 % (ref 24–39)
T4, Total: 8.8 ug/dL (ref 4.5–12.0)
TSH: 1.15 u[IU]/mL (ref 0.450–4.500)

## 2020-06-24 LAB — CMP14+EGFR
ALT: 10 IU/L (ref 0–32)
AST: 15 IU/L (ref 0–40)
Albumin/Globulin Ratio: 1.9 (ref 1.2–2.2)
Albumin: 4.5 g/dL (ref 3.7–4.7)
Alkaline Phosphatase: 114 IU/L (ref 44–121)
BUN/Creatinine Ratio: 10 — ABNORMAL LOW (ref 12–28)
BUN: 10 mg/dL (ref 8–27)
Bilirubin Total: 0.3 mg/dL (ref 0.0–1.2)
CO2: 23 mmol/L (ref 20–29)
Calcium: 9.7 mg/dL (ref 8.7–10.3)
Chloride: 101 mmol/L (ref 96–106)
Creatinine, Ser: 0.99 mg/dL (ref 0.57–1.00)
GFR calc Af Amer: 64 mL/min/{1.73_m2} (ref >59)
GFR calc non Af Amer: 56 mL/min/{1.73_m2} — ABNORMAL LOW (ref >59)
Globulin, Total: 2.4 g/dL (ref 1.5–4.5)
Glucose: 107 mg/dL — ABNORMAL HIGH (ref 65–99)
Potassium: 4.4 mmol/L (ref 3.5–5.2)
Sodium: 139 mmol/L (ref 134–144)
Total Protein: 6.9 g/dL (ref 6.0–8.5)

## 2020-06-24 LAB — LIPID PANEL
Chol/HDL Ratio: 3.5 ratio (ref 0.0–4.4)
Cholesterol, Total: 181 mg/dL (ref 100–199)
HDL: 51 mg/dL (ref >39)
LDL Chol Calc (NIH): 113 mg/dL — ABNORMAL HIGH (ref 0–99)
Triglycerides: 95 mg/dL (ref 0–149)
VLDL Cholesterol Cal: 17 mg/dL (ref 5–40)

## 2020-06-27 MED ORDER — ATORVASTATIN CALCIUM 40 MG PO TABS: 40.0000 mg | ORAL_TABLET | Freq: Every day | ORAL | 1 refills | Status: DC

## 2020-06-27 NOTE — Addendum Note (Signed)
Addended by: Chevis Pretty on: 06/27/2020 01:34 PM   Modules accepted: Orders

## 2021-01-02 ENCOUNTER — Encounter: Payer: Self-pay | Admitting: Nurse Practitioner

## 2021-01-02 ENCOUNTER — Ambulatory Visit: Payer: Medicare PPO | Admitting: Nurse Practitioner

## 2021-01-02 ENCOUNTER — Other Ambulatory Visit: Payer: Self-pay

## 2021-01-02 VITALS — BP 118/83 | HR 84 | Temp 98.9°F | Resp 20 | Ht 68.0 in | Wt 196.0 lb

## 2021-01-02 DIAGNOSIS — R0609 Other forms of dyspnea: Secondary | ICD-10-CM

## 2021-01-02 DIAGNOSIS — F17200 Nicotine dependence, unspecified, uncomplicated: Secondary | ICD-10-CM

## 2021-01-02 DIAGNOSIS — E039 Hypothyroidism, unspecified: Secondary | ICD-10-CM | POA: Diagnosis not present

## 2021-01-02 DIAGNOSIS — I10 Essential (primary) hypertension: Secondary | ICD-10-CM

## 2021-01-02 DIAGNOSIS — E7800 Pure hypercholesterolemia, unspecified: Secondary | ICD-10-CM | POA: Diagnosis not present

## 2021-01-02 DIAGNOSIS — F410 Panic disorder [episodic paroxysmal anxiety] without agoraphobia: Secondary | ICD-10-CM | POA: Diagnosis not present

## 2021-01-02 DIAGNOSIS — Z6829 Body mass index (BMI) 29.0-29.9, adult: Secondary | ICD-10-CM

## 2021-01-02 DIAGNOSIS — R0600 Dyspnea, unspecified: Secondary | ICD-10-CM

## 2021-01-02 DIAGNOSIS — F411 Generalized anxiety disorder: Secondary | ICD-10-CM | POA: Diagnosis not present

## 2021-01-02 DIAGNOSIS — K219 Gastro-esophageal reflux disease without esophagitis: Secondary | ICD-10-CM | POA: Diagnosis not present

## 2021-01-02 DIAGNOSIS — D509 Iron deficiency anemia, unspecified: Secondary | ICD-10-CM

## 2021-01-02 MED ORDER — LEVOTHYROXINE SODIUM 88 MCG PO TABS
88.0000 ug | ORAL_TABLET | Freq: Every day | ORAL | 1 refills | Status: DC
Start: 2021-01-02 — End: 2021-04-16

## 2021-01-02 MED ORDER — ESCITALOPRAM OXALATE 20 MG PO TABS: ORAL_TABLET | ORAL | 1 refills | Status: DC

## 2021-01-02 MED ORDER — AMLODIPINE BESYLATE 5 MG PO TABS: ORAL_TABLET | ORAL | 1 refills | Status: DC

## 2021-01-02 MED ORDER — OMEPRAZOLE 40 MG PO CPDR: 40.0000 mg | DELAYED_RELEASE_CAPSULE | Freq: Every day | ORAL | 1 refills | Status: DC

## 2021-01-02 NOTE — Progress Notes (Signed)
Subjective:    Patient ID: Peggy Smith, female    DOB: 25-Feb-1946, 75 y.o.   MRN: [id number]   Chief Complaint: Medical Management of Chronic Issues    HPI:  1. Primary hypertension No c/o chest pain, sob or headache. Does not check blood pressure at home. BP Readings from Last 3 Encounters:  01/02/21 118/83  06/23/20 120/78  12/22/19 123/73     2. Pure hypercholesterolemia Does try to watch diet and does little to no exercise. Lab Results  Component Value Date   CHOL 181 06/23/2020   HDL 51 06/23/2020   LDLCALC 113 (H) 06/23/2020   TRIG 95 06/23/2020   CHOLHDL 3.5 06/23/2020     3. Acquired hypothyroidism No problems that aware of. Lab Results  Component Value Date   TSH 1.150 06/23/2020      4. Gastroesophageal reflux disease without esophagitis Is on omeprazole daily and is doing well.  5. Anxiety state Is on lexapro daily and is doing well. Her husband died about 6 months ago and she is still doing well. GAD 7 : Generalized Anxiety Score 01/02/2021 06/23/2020 12/22/2019 09/23/2019  Nervous, Anxious, on Edge 0 3 0 2  Control/stop worrying 0 3 1 2   Worry too much - different things 0 3 1 2   Trouble relaxing 1 1 0 1  Restless 0 0 0 1  Easily annoyed or irritable 0 0 0 1  Afraid - awful might happen 0 0 0 1  Total GAD 7 Score 1 10 2 10   Anxiety Difficulty Not difficult at all Not difficult at all Not difficult at all Somewhat difficult      6. Panic attacks Takes klonopin as needed. She usually just takes 1/2 klonopin in morning.  7. Iron deficiency anemia, unspecified iron deficiency anemia type No c/o fatigue Lab Results  Component Value Date   HGB 12.9 06/23/2020     8. Smoking Just not a good time for her to quit smoking at this time.  9. BMI 29.0-29.9,adult No recent weight changes Wt Readings from Last 3 Encounters:  01/02/21 196 lb (88.9 kg)  06/23/20 191 lb (86.6 kg)  12/22/19 192 lb (87.1 kg)   BMI Readings from Last 3  Encounters:  01/02/21 29.80 kg/m  06/23/20 28.62 kg/m  12/22/19 28.35 kg/m       Outpatient Encounter Medications as of 01/02/2021  Medication Sig  . amLODipine (NORVASC) 5 MG tablet TAKE 1 TABLET BY MOUTH EVERY DAY  . aspirin 81 MG tablet Take 81 mg by mouth every morning.  Marland Kitchen atorvastatin (LIPITOR) 40 MG tablet Take 1 tablet (40 mg total) by mouth daily.  . calcium carbonate (OS-CAL) 600 MG TABS Take 600 mg by mouth every evening.  . Cholecalciferol (VITAMIN D-3) 5000 UNITS TABS Take 5,000 Units by mouth every evening.  . clonazePAM (KLONOPIN) 0.5 MG tablet Take 1 tablet (0.5 mg total) by mouth 2 (two) times daily as needed for anxiety.  Marland Kitchen escitalopram (LEXAPRO) 20 MG tablet TAKE 1 TABLET BY MOUTH EVERY DAY IN THE MORNING  . fluticasone (FLONASE) 50 MCG/ACT nasal spray SPRAY 2 SPRAYS INTO EACH NOSTRIL EVERY DAY  . levothyroxine (SYNTHROID) 88 MCG tablet Take 1 tablet (88 mcg total) by mouth daily.  . magnesium 30 MG tablet Take 1 tablet (30 mg total) by mouth daily.  . Multiple Vitamins-Minerals (CENTRUM SILVER PO) Take 1 tablet by mouth every morning.  Marland Kitchen omeprazole (PRILOSEC) 40 MG capsule Take 1 capsule (40 mg total) by mouth  daily.  . Riboflavin (VITAMIN B-2 PO) Take 100 mg by mouth every morning.     Past Surgical History:  Procedure Laterality Date  . COLONOSCOPY    . CYSTOSCOPY  11/08/2011   Procedure: CYSTOSCOPY;  Surgeon: Delice Lesch, MD;  Location: [facility];  Service: Gynecology;  Laterality: N/A;  . DILATION AND CURETTAGE OF UTERUS    . LAPAROSCOPIC HYSTERECTOMY  11/08/2011   Procedure: HYSTERECTOMY TOTAL LAPAROSCOPIC;  Surgeon: Delice Lesch, MD;  Location: [facility];  Service: Gynecology;  Laterality: N/A;  with Bilateral Salpingo-Oophorectomies  . MELANOMA EXCISION     Left Shoulder  . SVD      X 4  . TUBAL LIGATION    . UPPER GASTROINTESTINAL ENDOSCOPY      Family History  Problem Relation Age of Onset  . Dementia Mother   . Brain cancer Father   .  Anxiety disorder Brother   . Asthma Son   . Colon cancer Neg Hx   . Breast cancer Neg Hx     New complaints: Dyspnea on exertion. Started about 5-6 months ago  Social history: Lives by [employer]. She is currently taking care of her son.  Controlled substance contract: 01/02/21    Review of Systems  Constitutional: Negative for diaphoresis.  Eyes: Negative for pain.  Respiratory: Positive for shortness of breath.   Cardiovascular: Negative for chest pain, palpitations and leg swelling.  Gastrointestinal: Negative for abdominal pain.  Endocrine: Negative for polydipsia.  Skin: Negative for rash.  Neurological: Negative for dizziness, weakness and headaches.  Hematological: Does not bruise/bleed easily.  All other systems reviewed and are negative.      Objective:   Physical Exam Vitals and nursing note reviewed.  Constitutional:      General: She is not in acute distress.    Appearance: Normal appearance. She is well-developed.  HENT:     Head: Normocephalic.     Nose: Nose normal.  Eyes:     Pupils: Pupils are equal, round, and reactive to light.  Neck:     Vascular: No carotid bruit or JVD.  Cardiovascular:     Rate and Rhythm: Normal rate and regular rhythm.     Heart sounds: Normal heart sounds.  Pulmonary:     Effort: Pulmonary effort is normal. No respiratory distress.     Breath sounds: Normal breath sounds. No wheezing or rales.  Chest:     Chest wall: No tenderness.  Abdominal:     General: Bowel sounds are normal. There is no distension or abdominal bruit.     Palpations: Abdomen is soft. There is no hepatomegaly, splenomegaly, mass or pulsatile mass.     Tenderness: There is no abdominal tenderness.  Musculoskeletal:        General: Normal range of motion.     Cervical back: Normal range of motion and neck supple.  Lymphadenopathy:     Cervical: No cervical adenopathy.  Skin:    General: Skin is warm and dry.  Neurological:     Mental Status: She  is alert and oriented to person, place, and time.     Deep Tendon Reflexes: Reflexes are normal and symmetric.  Psychiatric:        Behavior: Behavior normal.        Thought Content: Thought content normal.        Judgment: Judgment normal.    BP 118/83   Pulse 84   Temp 98.9 F (37.2 C) (Temporal)   Resp 20  Ht 5' 8"  (1.727 m)   Wt 196 lb (88.9 kg)   BMI 29.80 kg/m   EKG- NSR-[name] Hassell Done, FNP      Assessment & Plan:  PALIN TRISTAN comes in today with chief complaint of Medical Management of Chronic Issues   Diagnosis and orders addressed:  1. Primary hypertension Low sodium diet - amLODipine (NORVASC) 5 MG tablet; TAKE 1 TABLET BY MOUTH EVERY DAY  Dispense: 90 tablet; Refill: 1 - CMP14+EGFR  2. Pure hypercholesterolemia Low fat diet - Lipid panel  3. Acquired hypothyroidism Labs pending - levothyroxine (SYNTHROID) 88 MCG tablet; Take 1 tablet (88 mcg total) by mouth daily.  Dispense: 90 tablet; Refill: 1 - Thyroid Panel With TSH  4. Gastroesophageal reflux disease without esophagitis Avoid spicy foods Do not eat 2 hours prior to bedtime - omeprazole (PRILOSEC) 40 MG capsule; Take 1 capsule (40 mg total) by mouth daily.  Dispense: 90 capsule; Refill: 1  5. Anxiety state stress management  6. Panic attacks - escitalopram (LEXAPRO) 20 MG tablet; TAKE 1 TABLET BY MOUTH EVERY DAY IN THE MORNING  Dispense: 90 tablet; Refill: 1  7. Iron deficiency anemia, unspecified iron deficiency anemia type lbas pending - CBC with Differential/Platelet  8. Smoking Smoking cessation encouraged  9. BMI 29.0-29.9,adult Discussed diet and exercise for person with BMI >25 Will recheck weight in 3-6 months  10. DOE (dyspnea on exertion) referral to cardiology - EKG 12-Lead - Ambulatory referral to Cardiology   Labs pending Health Maintenance reviewed Diet and exercise encouraged  Follow up plan: 6 months   [place], FNP

## 2021-01-02 NOTE — Patient Instructions (Signed)
Shortness of Breath, Adult Shortness of breath means you have trouble breathing. Shortness of breath could be a sign of a medical problem. Follow these instructions at home:  Watch for any changes in your symptoms.  Do not use any products that contain nicotine or tobacco, such as cigarettes, e-cigarettes, and chewing tobacco.  Do not smoke. Smoking can cause shortness of breath. If you need help to quit smoking, ask your doctor.  Avoid things that can make it harder to breathe, such as: ? Mold. ? Dust. ? Air pollution. ? Chemical smells. ? Things that can cause allergy symptoms (allergens), if you have allergies.  Keep your living space clean. Use products that help remove mold and dust.  Rest as needed. Slowly return to your normal activities.  Take over-the-counter and prescription medicines only as told by your doctor. This includes oxygen therapy and inhaled medicines.  Keep all follow-up visits as told by your doctor. This is important.   Contact a doctor if:  Your condition does not get better as soon as expected.  You have a hard time doing your normal activities, even after you rest.  You have new symptoms. Get help right away if:  Your shortness of breath gets worse.  You have trouble breathing when you are resting.  You feel light-headed or you pass out (faint).  You have a cough that is not helped by medicines.  You cough up blood.  You have pain with breathing.  You have pain in your chest, arms, shoulders, or belly (abdomen).  You have a fever.  You cannot walk up stairs.  You cannot exercise the way you normally do. These symptoms may represent a serious problem that is an emergency. Do not wait to see if the symptoms will go away. Get medical help right away. Call your local emergency services (911 in the [place] Do not drive yourself to the hospital. Summary  Shortness of breath is when you have trouble breathing enough air. It can be a sign of a  medical problem.  Avoid things that make it hard for you to breathe, such as smoking, pollution, mold, and dust.  Watch for any changes in your symptoms. Contact your doctor if you do not get better or you get worse. This information is not intended to replace advice given to you by your health care provider. Make sure you discuss any questions you have with your health care provider. Document Revised: 12/22/2017 Document Reviewed: 12/22/2017 Elsevier Patient Education  2021 [employer]  

## 2021-01-03 LAB — CBC WITH DIFFERENTIAL/PLATELET
Basophils Absolute: 0.1 10*3/uL (ref 0.0–0.2)
Basos: 1 %
EOS (ABSOLUTE): 0.2 10*3/uL (ref 0.0–0.4)
Eos: 3 %
Hematocrit: 39.7 % (ref 34.0–46.6)
Hemoglobin: 12.8 g/dL (ref 11.1–15.9)
Immature Grans (Abs): 0 10*3/uL (ref 0.0–0.1)
Immature Granulocytes: 0 %
Lymphocytes Absolute: 2.6 10*3/uL (ref 0.7–3.1)
Lymphs: 29 %
MCH: 26.8 pg (ref 26.6–33.0)
MCHC: 32.2 g/dL (ref 31.5–35.7)
MCV: 83 fL (ref 79–97)
Monocytes Absolute: 0.7 10*3/uL (ref 0.1–0.9)
Monocytes: 8 %
Neutrophils Absolute: 5.1 10*3/uL (ref 1.4–7.0)
Neutrophils: 59 %
Platelets: 407 10*3/uL (ref 150–450)
RBC: 4.78 x10E6/uL (ref 3.77–5.28)
RDW: 13.9 % (ref 11.7–15.4)
WBC: 8.8 10*3/uL (ref 3.4–10.8)

## 2021-01-03 LAB — LIPID PANEL
Chol/HDL Ratio: 4.1 ratio (ref 0.0–4.4)
Cholesterol, Total: 211 mg/dL — ABNORMAL HIGH (ref 100–199)
HDL: 51 mg/dL (ref >39)
LDL Chol Calc (NIH): 137 mg/dL — ABNORMAL HIGH (ref 0–99)
Triglycerides: 129 mg/dL (ref 0–149)
VLDL Cholesterol Cal: 23 mg/dL (ref 5–40)

## 2021-01-03 LAB — CMP14+EGFR
ALT: 13 IU/L (ref 0–32)
AST: 18 IU/L (ref 0–40)
Albumin/Globulin Ratio: 1.7 (ref 1.2–2.2)
Albumin: 4.5 g/dL (ref 3.7–4.7)
Alkaline Phosphatase: 114 IU/L (ref 44–121)
BUN/Creatinine Ratio: 12 (ref 12–28)
BUN: 13 mg/dL (ref 8–27)
Bilirubin Total: 0.3 mg/dL (ref 0.0–1.2)
CO2: 25 mmol/L (ref 20–29)
Calcium: 9.8 mg/dL (ref 8.7–10.3)
Chloride: 98 mmol/L (ref 96–106)
Creatinine, Ser: 1.07 mg/dL — ABNORMAL HIGH (ref 0.57–1.00)
Globulin, Total: 2.7 g/dL (ref 1.5–4.5)
Glucose: 107 mg/dL — ABNORMAL HIGH (ref 65–99)
Potassium: 5.4 mmol/L — ABNORMAL HIGH (ref 3.5–5.2)
Sodium: 139 mmol/L (ref 134–144)
Total Protein: 7.2 g/dL (ref 6.0–8.5)
eGFR: 54 mL/min/{1.73_m2} — ABNORMAL LOW (ref >59)

## 2021-01-03 LAB — THYROID PANEL WITH TSH
Free Thyroxine Index: 1.8 (ref 1.2–4.9)
T3 Uptake Ratio: 23 % — ABNORMAL LOW (ref 24–39)
T4, Total: 7.9 ug/dL (ref 4.5–12.0)
TSH: 2.16 u[IU]/mL (ref 0.450–4.500)

## 2021-01-10 ENCOUNTER — Ambulatory Visit (INDEPENDENT_AMBULATORY_CARE_PROVIDER_SITE_OTHER): Payer: Medicare PPO

## 2021-01-10 VITALS — Ht 68.0 in | Wt 196.0 lb

## 2021-01-10 DIAGNOSIS — Z1231 Encounter for screening mammogram for malignant neoplasm of breast: Secondary | ICD-10-CM | POA: Diagnosis not present

## 2021-01-10 DIAGNOSIS — Z1211 Encounter for screening for malignant neoplasm of colon: Secondary | ICD-10-CM | POA: Diagnosis not present

## 2021-01-10 DIAGNOSIS — Z0000 Encounter for general adult medical examination without abnormal findings: Secondary | ICD-10-CM

## 2021-01-10 DIAGNOSIS — Z780 Asymptomatic menopausal state: Secondary | ICD-10-CM

## 2021-01-10 NOTE — Progress Notes (Signed)
Subjective:   Peggy Smith is a 75 y.o. female who presents for Medicare Annual (Subsequent) preventive examination.  Virtual Visit via Telephone Note  I connected with  Peggy Smith on 01/10/21 at  1:15 PM EDT by telephone and verified that I am speaking with the correct person using two identifiers.  Location: Patient: Home Provider: [facility] Persons participating in the virtual visit: patient/Nurse Health Advisor   I discussed the limitations, risks, security and privacy concerns of performing an evaluation and management service by telephone and the availability of in person appointments. The patient expressed understanding and agreed to proceed.  Interactive audio and video telecommunications were attempted between this nurse and patient, however failed, due to patient having technical difficulties OR patient did not have access to video capability.  We continued and completed visit with audio only.  Some vital signs may be absent or patient reported.   Chelsie Burel E Donnita Farina, LPN   Review of Systems     Cardiac Risk Factors include: advanced age (>14men, >59 women);sedentary lifestyle;hypertension;dyslipidemia     Objective:    Today's Vitals   01/10/21 1301  Weight: 196 lb (88.9 kg)  Height: 5\' 8"  (1.727 m)   Body mass index is 29.8 kg/m.  Advanced Directives 01/10/2021 01/10/2020 06/29/2018 06/13/2015 10/31/2011 05/17/2011  Does Patient Have a Medical Advance Directive? Yes No No No Patient does not have advance directive Patient does not have advance directive  Type of Advance Directive [place]  Does patient want to make changes to medical advance directive? - No - Patient declined - - - -  Copy of [place] in Chart? No - copy requested - - - - -  Would patient like information on creating a medical advance directive? - No - Patient declined No - Patient declined - - -    Current Medications (verified) Outpatient  Encounter Medications as of 01/10/2021  Medication Sig  . amLODipine (NORVASC) 5 MG tablet TAKE 1 TABLET BY MOUTH EVERY DAY  . aspirin 81 MG tablet Take 81 mg by mouth every morning.  Marland Kitchen atorvastatin (LIPITOR) 40 MG tablet Take 1 tablet (40 mg total) by mouth daily.  . calcium carbonate (OS-CAL) 600 MG TABS Take 600 mg by mouth every evening.  . Cholecalciferol (VITAMIN D-3) 5000 UNITS TABS Take 5,000 Units by mouth every evening.  . clonazePAM (KLONOPIN) 0.5 MG tablet Take 1 tablet (0.5 mg total) by mouth 2 (two) times daily as needed for anxiety.  Marland Kitchen escitalopram (LEXAPRO) 20 MG tablet TAKE 1 TABLET BY MOUTH EVERY DAY IN THE MORNING  . fluticasone (FLONASE) 50 MCG/ACT nasal spray SPRAY 2 SPRAYS INTO EACH NOSTRIL EVERY DAY  . levothyroxine (SYNTHROID) 88 MCG tablet Take 1 tablet (88 mcg total) by mouth daily.  . magnesium 30 MG tablet Take 1 tablet (30 mg total) by mouth daily.  . Multiple Vitamins-Minerals (CENTRUM SILVER PO) Take 1 tablet by mouth every morning.  Marland Kitchen omeprazole (PRILOSEC) 40 MG capsule Take 1 capsule (40 mg total) by mouth daily.  . Riboflavin (VITAMIN B-2 PO) Take 100 mg by mouth every morning.  . [DISCONTINUED] esomeprazole (NEXIUM) 40 MG capsule Take 40 mg by mouth daily before supper.   . [DISCONTINUED] hydrochlorothiazide 25 MG tablet Take 25 mg by mouth daily.     No facility-administered encounter medications on file as of 01/10/2021.    Allergies (verified) Crestor [rosuvastatin], Macrodantin, Sulfa antibiotics, and Codeine   History: Past  Medical History:  Diagnosis Date  . Angina    STRESS TEST NORMAL-ANXIETY  . Anxiety   . Cardiac arrhythmia due to congenital heart disease   . Depression   . GERD (gastroesophageal reflux disease)   . Headache(784.0)    OTC MEDS  . Hepatitis     1976  . Hyperlipidemia    DOES NOT TAKE MEDS - HAS THEM AT HOME BUT STATES DOES NOT TAKE  . Hypertension   . Hypothyroidism   . Iron deficiency anemia   . Melanoma ([place])   .  Panic attacks   . PONV (postoperative nausea and vomiting)    Past Surgical History:  Procedure Laterality Date  . COLONOSCOPY    . CYSTOSCOPY  11/08/2011   Procedure: CYSTOSCOPY;  Surgeon: Delice Lesch, MD;  Location: [facility];  Service: Gynecology;  Laterality: N/A;  . DILATION AND CURETTAGE OF UTERUS    . LAPAROSCOPIC HYSTERECTOMY  11/08/2011   Procedure: HYSTERECTOMY TOTAL LAPAROSCOPIC;  Surgeon: Delice Lesch, MD;  Location: [facility];  Service: Gynecology;  Laterality: N/A;  with Bilateral Salpingo-Oophorectomies  . MELANOMA EXCISION     Left Shoulder  . SVD      X 4  . TUBAL LIGATION    . UPPER GASTROINTESTINAL ENDOSCOPY     Family History  Problem Relation Age of Onset  . Dementia Mother   . Brain cancer Father   . Anxiety disorder Brother   . Asthma Son   . Colon cancer Neg Hx   . Breast cancer Neg Hx    Social History   Socioeconomic History  . Marital status: Widowed    Spouse name: [name]  . Number of children: 4  . Years of education: Not on file  . Highest education level: Master's degree (e.g., MA, MS, MEng, MEd, MSW, MBA)  Occupational History  . Occupation: paralegal    Employer: [employer]  Tobacco Use  . Smoking status: Current Every Day Smoker    Packs/day: 0.25    Years: 30.00    Pack years: 7.50    Types: Cigarettes  . Smokeless tobacco: Never Used  Substance and Sexual Activity  . Alcohol use: No  . Drug use: No  . Sexual activity: Yes    Birth control/protection: Surgical  Other Topics Concern  . Not on file  Social History Narrative   Lives alone, but one son stays with her a lot - Daughter lives in [place], other children live nearby   Social Determinants of Health   Financial Resource Strain: [place]   . Difficulty of Paying Living Expenses: Not hard at all  Food Insecurity: No Food Insecurity  . Worried About Charity fundraiser in the Last Year: Never true  . Ran Out of Food in the Last Year: Never true  Transportation  Needs: No Transportation Needs  . Lack of Transportation (Medical): No  . Lack of Transportation (Non-Medical): No  Physical Activity: Inactive  . Days of Exercise per Week: 0 days  . Minutes of Exercise per Session: 0 min  Stress: No Stress Concern Present  . Feeling of Stress : Not at all  Social Connections: Moderately Integrated  . Frequency of Communication with Friends and Family: More than three times a week  . Frequency of Social Gatherings with Friends and Family: More than three times a week  . Attends Religious Services: More than 4 times per year  . Active Member of Clubs or Organizations: Yes  . Attends  Club or Organization Meetings: More than 4 times per year  . Marital Status: Widowed    Tobacco Counseling Ready to quit: Not Answered Counseling given: Not Answered   Clinical Intake:  Pre-visit preparation completed: Yes  Pain : No/denies pain     BMI - recorded: 29.8 Nutritional Status: BMI 25 -29 Overweight Nutritional Risks: None Diabetes: No  How often do you need to have someone help you when you read instructions, pamphlets, or other written materials from your doctor or pharmacy?: 1 - Never  Diabetic? No  Interpreter Needed?: No  Information entered by :: Arrielle Mcginn, LPN   Activities of Daily Living In your present state of health, do you have any difficulty performing the following activities: 01/10/2021  Hearing? N  Vision? N  Difficulty concentrating or making decisions? N  Walking or climbing stairs? N  Dressing or bathing? N  Doing errands, shopping? N  Preparing Food and eating ? N  Using the Toilet? N  In the past six months, have you accidently leaked urine? N  Do you have problems with loss of bowel control? N  Managing your Medications? N  Managing your Finances? N  Housekeeping or managing your Housekeeping? N  Some recent data might be hidden    Patient Care Team: Chevis Pretty, FNP as PCP - General (Family  Medicine)  Indicate any recent Medical Services you may have received from other than [facility] providers in the past year (date may be approximate).     Assessment:   This is a routine wellness examination for Peggy Smith.  Hearing/Vision screen  Hearing Screening   125Hz  250Hz  500Hz  1000Hz  2000Hz  3000Hz  4000Hz  6000Hz  8000Hz   Right ear:           Left ear:           Comments: Denies hearing difficulties   Vision Screening Comments: Wears tri-focals - up to date with annual eye exams at [facility] in [place]   Dietary issues and exercise activities discussed: Current Exercise Habits: The patient does not participate in regular exercise at present  Goals Addressed            This Visit's Progress   . Exercise 3x per week (30 min per time)       She hopes to get the motivation to walk daily, get energy levels up and get out of her depression      Depression Screen PHQ 2/9 Scores 01/10/2021 01/02/2021 06/23/2020 01/10/2020 12/22/2019 03/23/2019 12/18/2018  PHQ - 2 Score 3 1 0 0 0 0 0  PHQ- 9 Score 8 5 - - - - -    Fall Risk Fall Risk  01/10/2021 01/02/2021 06/23/2020 01/10/2020 12/22/2019  Falls in the past year? 0 0 0 0 0  Number falls in past yr: 0 - - - -  Injury with Fall? 0 - - - -  Comment - - - - -  Risk for fall due to : Impaired vision - - - -  Follow up Falls prevention discussed;Education provided - - - -    FALL RISK PREVENTION PERTAINING TO THE HOME:  Any stairs in or around the home? Yes  If so, are there any without handrails? No  Home free of loose throw rugs in walkways, pet beds, electrical cords, etc? Yes  Adequate lighting in your home to reduce risk of falls? Yes   ASSISTIVE DEVICES UTILIZED TO PREVENT FALLS:  Life alert? No  Use of a cane, walker or w/c? No  Grab bars in the bathroom? No  Shower chair or bench in shower? Yes  Elevated toilet seat or a handicapped toilet? No   TIMED UP AND GO:  Was the test performed? No . Telephonic visit.  Cognitive  Function: Normal cognitive status assessed by direct observation by this Nurse Health Advisor. No abnormalities found.   MMSE - Mini Mental State Exam 06/29/2018  Orientation to time 5  Orientation to Place 5  Registration 3  Attention/ Calculation 5  Recall 3  Language- name 2 objects 2  Language- repeat 1  Language- follow 3 step command 3  Language- read & follow direction 1  Write a sentence 1  Copy design 1  Total score 30     6CIT Screen 01/10/2020  What Year? 0 points  What month? 0 points  What time? 0 points  Count back from 20 0 points  Months in reverse 0 points  Repeat phrase 0 points  Total Score 0    Immunizations Immunization History  Administered Date(s) Administered  . Influenza-Unspecified 07/16/2011, 06/15/2012  . Moderna SARS-COV2 Booster Vaccination 07/19/2020  . Moderna Sars-Covid-2 Vaccination 09/09/2019, 10/11/2019, 07/19/2020  . Tdap 11/09/2013    TDAP status: Up to date  Flu Vaccine status: Declined, Education has been provided regarding the importance of this vaccine but patient still declined. Advised may receive this vaccine at local pharmacy or Health Dept. Aware to provide a copy of the vaccination record if obtained from local pharmacy or Health Dept. Verbalized acceptance and understanding.  Pneumococcal vaccine status: Declined,  Education has been provided regarding the importance of this vaccine but patient still declined. Advised may receive this vaccine at local pharmacy or Health Dept. Aware to provide a copy of the vaccination record if obtained from local pharmacy or Health Dept. Verbalized acceptance and understanding.   Covid-19 vaccine status: Completed vaccines  Qualifies for Shingles Vaccine? Yes   Zostavax completed No   Shingrix Completed?: No.    Education has been provided regarding the importance of this vaccine. Patient has been advised to call insurance company to determine out of pocket expense if they have not yet  received this vaccine. Advised may also receive vaccine at local pharmacy or Health Dept. Verbalized acceptance and understanding.  Screening Tests Health Maintenance  Topic Date Due  . Pneumococcal Vaccine 33-55 Years old (1 of 4 - PCV13) Never done  . DEXA SCAN  06/10/2019  . MAMMOGRAM  08/01/2019  . COVID-19 Vaccine (4 - Booster for Moderna series) 01/18/2021 (Originally 10/17/2020)  . Zoster Vaccines- Shingrix (1 of 2) 04/04/2021 (Originally 05/06/1995)  . INFLUENZA VACCINE  03/05/2021  . COLONOSCOPY (Pts 45-19yrs Insurance coverage will need to be confirmed)  03/28/2021  . TETANUS/TDAP  11/10/2023  . Hepatitis C Screening  Completed  . HPV VACCINES  Aged Out  . PNA vac Low Risk Adult  Discontinued    Health Maintenance  Health Maintenance Due  Topic Date Due  . Pneumococcal Vaccine 38-21 Years old (1 of 4 - PCV13) Never done  . DEXA SCAN  06/10/2019  . MAMMOGRAM  08/01/2019    Colorectal cancer screening: Type of screening: Colonoscopy. Completed 03/29/2011. Repeat every 10 years  Mammogram status: Ordered 01/10/21. Pt provided with contact info and advised to call to schedule appt.   Bone Density status: Completed 06/09/2017. Results reflect: Bone density results: OSTEOPENIA. Repeat every 2 years.  Lung Cancer Screening: (Low Dose CT Chest recommended if Age 78-80 years, 30 pack-year currently smoking OR have quit  w/in 15years.) does not qualify.   Additional Screening:  Hepatitis C Screening: does qualify; Completed 11/29/2016  Vision Screening: Recommended annual ophthalmology exams for early detection of glaucoma and other disorders of the eye. Is the patient up to date with their annual eye exam?  Yes  Who is the provider or what is the name of the office in which the patient attends annual eye exams? [facility]  If pt is not established with a provider, would they like to be referred to a provider to establish care? No .   Dental Screening: Recommended  annual dental exams for proper oral hygiene  Community Resource Referral / Chronic Care Management: CRR required this visit?  No   CCM required this visit?  No      Plan:     I have personally reviewed and noted the following in the patient's chart:   . Medical and social history . Use of alcohol, tobacco or illicit drugs  . Current medications and supplements including opioid prescriptions.  . Functional ability and status . Nutritional status . Physical activity . Advanced directives . List of other physicians . Hospitalizations, surgeries, and ER visits in previous 12 months . Vitals . Screenings to include cognitive, depression, and falls . Referrals and appointments  In addition, I have reviewed and discussed with patient certain preventive protocols, quality metrics, and best practice recommendations. A written personalized care plan for preventive services as well as general preventive health recommendations were provided to patient.     Sandrea Hammond, LPN   10/09/1060   Nurse Notes: None

## 2021-01-10 NOTE — Patient Instructions (Signed)
Peggy Smith , Thank you for taking time to come for your Medicare Wellness Visit. I appreciate your ongoing commitment to your health goals. Please review the following plan we discussed and let me know if I can assist you in the future.   Screening recommendations/referrals: Colonoscopy: Done 03/29/2011 - Repeat in 10 years (order placed today) Mammogram: Done 07/31/2018 - Repeat annually (order placed today) Bone Density: Done 06/09/2017 - Repeat every 2 years (get at next visit) Recommended yearly ophthalmology/optometry visit for glaucoma screening and checkup Recommended yearly dental visit for hygiene and checkup  Vaccinations: Influenza vaccine: Declined Pneumococcal vaccine: Declined Tdap vaccine: Done 11/09/2013 - Repeat in 10 years Shingles vaccine: Shingrix discussed. Please contact your pharmacy for coverage information.    Covid-19: Done 09/09/19, 10/11/19, & 07/19/20  Advanced directives: Please bring a copy of your health care power of attorney and living will to the office to be added to your chart at your convenience.  Conditions/risks identified: Aim for 30 minutes of exercise or brisk walking each day, drink 6-8 glasses of water and eat lots of fruits and vegetables.  Next appointment: Follow up in one year for your annual wellness visit    Preventive Care 65 Years and Older, Female Preventive care refers to lifestyle choices and visits with your health care provider that can promote health and wellness. What does preventive care include?  A yearly physical exam. This is also called an annual well check.  Dental exams once or twice a year.  Routine eye exams. Ask your health care provider how often you should have your eyes checked.  Personal lifestyle choices, including:  Daily care of your teeth and gums.  Regular physical activity.  Eating a healthy diet.  Avoiding tobacco and drug use.  Limiting alcohol use.  Practicing safe sex.  Taking low-dose  aspirin every day.  Taking vitamin and mineral supplements as recommended by your health care provider. What happens during an annual well check? The services and screenings done by your health care provider during your annual well check will depend on your age, overall health, lifestyle risk factors, and family history of disease. Counseling  Your health care provider may ask you questions about your:  Alcohol use.  Tobacco use.  Drug use.  Emotional well-being.  Home and relationship well-being.  Sexual activity.  Eating habits.  History of falls.  Memory and ability to understand (cognition).  Work and work Statistician.  Reproductive health. Screening  You may have the following tests or measurements:  Height, weight, and BMI.  Blood pressure.  Lipid and cholesterol levels. These may be checked every 5 years, or more frequently if you are over 76 years old.  Skin check.  Lung cancer screening. You may have this screening every year starting at age 102 if you have a 30-pack-year history of smoking and currently smoke or have quit within the past 15 years.  Fecal occult blood test (FOBT) of the stool. You may have this test every year starting at age 70.  Flexible sigmoidoscopy or colonoscopy. You may have a sigmoidoscopy every 5 years or a colonoscopy every 10 years starting at age 70.  Hepatitis C blood test.  Hepatitis B blood test.  Sexually transmitted disease (STD) testing.  Diabetes screening. This is done by checking your blood sugar (glucose) after you have not eaten for a while (fasting). You may have this done every 1-3 years.  Bone density scan. This is done to screen for osteoporosis. You may have  this done starting at age 23.  Mammogram. This may be done every 1-2 years. Talk to your health care provider about how often you should have regular mammograms. Talk with your health care provider about your test results, treatment options, and if  necessary, the need for more tests. Vaccines  Your health care provider may recommend certain vaccines, such as:  Influenza vaccine. This is recommended every year.  Tetanus, diphtheria, and acellular pertussis (Tdap, Td) vaccine. You may need a Td booster every 10 years.  Zoster vaccine. You may need this after age 60.  Pneumococcal 13-valent conjugate (PCV13) vaccine. One dose is recommended after age 74.  Pneumococcal polysaccharide (PPSV23) vaccine. One dose is recommended after age 53. Talk to your health care provider about which screenings and vaccines you need and how often you need them. This information is not intended to replace advice given to you by your health care provider. Make sure you discuss any questions you have with your health care provider. Document Released: 08/18/2015 Document Revised: 04/10/2016 Document Reviewed: 05/23/2015 Elsevier Interactive Patient Education  2017 [employer] Prevention in the Home Falls can cause injuries. They can happen to people of all ages. There are many things you can do to make your home safe and to help prevent falls. What can I do on the outside of my home?  Regularly fix the edges of walkways and driveways and fix any cracks.  Remove anything that might make you trip as you walk through a door, such as a raised step or threshold.  Trim any bushes or trees on the path to your home.  Use bright outdoor lighting.  Clear any walking paths of anything that might make someone trip, such as rocks or tools.  Regularly check to see if handrails are loose or broken. Make sure that both sides of any steps have handrails.  Any raised decks and porches should have guardrails on the edges.  Have any leaves, snow, or ice cleared regularly.  Use sand or salt on walking paths during winter.  Clean up any spills in your garage right away. This includes oil or grease spills. What can I do in the bathroom?  Use night  lights.  Install grab bars by the toilet and in the tub and shower. Do not use towel bars as grab bars.  Use non-skid mats or decals in the tub or shower.  If you need to sit down in the shower, use a plastic, non-slip stool.  Keep the floor dry. Clean up any water that spills on the floor as soon as it happens.  Remove soap buildup in the tub or shower regularly.  Attach bath mats securely with double-sided non-slip rug tape.  Do not have throw rugs and other things on the floor that can make you trip. What can I do in the bedroom?  Use night lights.  Make sure that you have a light by your bed that is easy to reach.  Do not use any sheets or blankets that are too big for your bed. They should not hang down onto the floor.  Have a firm chair that has side arms. You can use this for support while you get dressed.  Do not have throw rugs and other things on the floor that can make you trip. What can I do in the kitchen?  Clean up any spills right away.  Avoid walking on wet floors.  Keep items that you use a lot in easy-to-reach  places.  If you need to reach something above you, use a strong step stool that has a grab bar.  Keep electrical cords out of the way.  Do not use floor polish or wax that makes floors slippery. If you must use wax, use non-skid floor wax.  Do not have throw rugs and other things on the floor that can make you trip. What can I do with my stairs?  Do not leave any items on the stairs.  Make sure that there are handrails on both sides of the stairs and use them. Fix handrails that are broken or loose. Make sure that handrails are as long as the stairways.  Check any carpeting to make sure that it is firmly attached to the stairs. Fix any carpet that is loose or worn.  Avoid having throw rugs at the top or bottom of the stairs. If you do have throw rugs, attach them to the floor with carpet tape.  Make sure that you have a light switch at the  top of the stairs and the bottom of the stairs. If you do not have them, ask someone to add them for you. What else can I do to help prevent falls?  Wear shoes that:  Do not have high heels.  Have rubber bottoms.  Are comfortable and fit you well.  Are closed at the toe. Do not wear sandals.  If you use a stepladder:  Make sure that it is fully opened. Do not climb a closed stepladder.  Make sure that both sides of the stepladder are locked into place.  Ask someone to hold it for you, if possible.  Clearly mark and make sure that you can see:  Any grab bars or handrails.  First and last steps.  Where the edge of each step is.  Use tools that help you move around (mobility aids) if they are needed. These include:  Canes.  Walkers.  Scooters.  Crutches.  Turn on the lights when you go into a dark area. Replace any light bulbs as soon as they burn out.  Set up your furniture so you have a clear path. Avoid moving your furniture around.  If any of your floors are uneven, fix them.  If there are any pets around you, be aware of where they are.  Review your medicines with your doctor. Some medicines can make you feel dizzy. This can increase your chance of falling. Ask your doctor what other things that you can do to help prevent falls. This information is not intended to replace advice given to you by your health care provider. Make sure you discuss any questions you have with your health care provider. Document Released: 05/18/2009 Document Revised: 12/28/2015 Document Reviewed: 08/26/2014 Elsevier Interactive Patient Education  2017 [employer].

## 2021-01-30 ENCOUNTER — Other Ambulatory Visit: Payer: Self-pay

## 2021-01-30 ENCOUNTER — Ambulatory Visit (INDEPENDENT_AMBULATORY_CARE_PROVIDER_SITE_OTHER): Payer: Medicare PPO

## 2021-01-30 DIAGNOSIS — Z780 Asymptomatic menopausal state: Secondary | ICD-10-CM

## 2021-01-30 DIAGNOSIS — M85851 Other specified disorders of bone density and structure, right thigh: Secondary | ICD-10-CM | POA: Diagnosis not present

## 2021-02-07 ENCOUNTER — Encounter: Payer: Self-pay | Admitting: Internal Medicine

## 2021-02-07 ENCOUNTER — Ambulatory Visit: Payer: Medicare PPO | Admitting: Internal Medicine

## 2021-02-07 ENCOUNTER — Other Ambulatory Visit: Payer: Self-pay

## 2021-02-07 VITALS — BP 120/80 | HR 85 | Ht 68.0 in | Wt 200.0 lb

## 2021-02-07 DIAGNOSIS — R072 Precordial pain: Secondary | ICD-10-CM

## 2021-02-07 DIAGNOSIS — E782 Mixed hyperlipidemia: Secondary | ICD-10-CM

## 2021-02-07 DIAGNOSIS — R0609 Other forms of dyspnea: Secondary | ICD-10-CM

## 2021-02-07 DIAGNOSIS — R0600 Dyspnea, unspecified: Secondary | ICD-10-CM

## 2021-02-07 MED ORDER — METOPROLOL TARTRATE 100 MG PO TABS: ORAL_TABLET | ORAL | 0 refills | Status: DC

## 2021-02-07 NOTE — Patient Instructions (Signed)
Medication Instructions:  Your physician recommends that you continue on your current medications as directed. Please refer to the Current Medication list given to you today.  *If you need a refill on your cardiac medications before your next appointment, please call your pharmacy*   Lab Work: NONE If you have labs (blood work) drawn today and your tests are completely normal, you will receive your results only by: [place] (if you have MyChart) OR A paper copy in the mail If you have any lab test that is abnormal or we need to change your treatment, we will call you to review the results.   Testing/Procedures: Your physician has requested that you have cardiac CT. Cardiac computed tomography (CT) is a painless test that uses an x-ray machine to take clear, detailed pictures of your heart. For further information please visit [email or web address]. Please follow instruction sheet as given.     Follow-Up: At [facility], you and your health needs are our priority.  As part of our continuing mission to provide you with exceptional heart care, we have created designated Provider Care Teams.  These Care Teams include your primary Cardiologist (physician) and Advanced Practice Providers (APPs -  Physician Assistants and Nurse Practitioners) who all work together to provide you with the care you need, when you need it.  We recommend signing up for the patient portal called "MyChart".  Sign up information is provided on this After Visit Summary.  MyChart is used to connect with patients for Virtual Visits (Telemedicine).  Patients are able to view lab/test results, encounter notes, upcoming appointments, etc.  Non-urgent messages can be sent to your provider as well.   To learn more about what you can do with MyChart, go to [email or web address].    Your next appointment:   3-4  month(s)  The format for your next appointment:   In Person  Provider:   You may see Rudean Haskell, MD or one of the following Advanced Practice Providers on your designated Care Team:   Melina Copa, PA-C Ermalinda Barrios, PA-C   Other Instructions   Your cardiac CT will be scheduled at one of the below locations:   [facility] [address] [place], [place] [address] [phone]  [facility] [address] [place], [place] [address] [phone]  If scheduled at [facility], please arrive at the [facility] main entrance (entrance A) of [facility] 30 minutes prior to test start time. Proceed to the [facility] Radiology Department (first floor) to check-in and test prep.  If scheduled at [facility], please arrive 15 mins early for check-in and test prep.  Please follow these instructions carefully (unless otherwise directed):   On the Night Before the Test: Be sure to Drink plenty of water. Do not consume any caffeinated/decaffeinated beverages or chocolate 12 hours prior to your test. Do not take any antihistamines (Flonase) 12 hours prior to your test.   On the Day of the Test: Drink plenty of water until 1 hour prior to the test. Do not eat any food 4 hours prior to the test. You may take your regular medications prior to the test.  Take metoprolol (Lopressor) 100 mg two hours prior to test. HOLD Furosemide/Hydrochlorothiazide morning of the test. FEMALES- please wear underwire-free bra if available       After the Test: Drink plenty of water. After receiving IV contrast, you may experience a mild flushed  feeling. This is normal. On occasion, you may experience a mild rash up to 24 hours after the test. This is not dangerous. If this occurs, you can take Benadryl 25 mg and increase your fluid intake. If you experience trouble breathing, this can be serious. If it is severe call 911 IMMEDIATELY. If it is mild, please call our office. If you take  any of these medications: Glipizide/Metformin, Avandament, Glucavance, please do not take 48 hours after completing test unless otherwise instructed.   Once we have confirmed authorization from your insurance company, we will call you to set up a date and time for your test. Based on how quickly your insurance processes prior authorizations requests, please allow up to 4 weeks to be contacted for scheduling your Cardiac CT appointment. Be advised that routine Cardiac CT appointments could be scheduled as many as 8 weeks after your provider has ordered it.  For non-scheduling related questions, please contact the cardiac imaging nurse navigator should you have any questions/concerns: Marchia Bond, Cardiac Imaging Nurse Navigator Gordy Clement, Cardiac Imaging Nurse Navigator [facility] Direct Office Dial: [phone]   For scheduling needs, including cancellations and rescheduling, please call [place], [phone].

## 2021-02-07 NOTE — Progress Notes (Signed)
Cardiology Office Note:    Date:  02/07/2021   ID:  Peggy Smith, DOB [birth year], MRN [id number]  PCP:  Chevis Pretty, [facility] Providers Cardiologist:  Werner Lean, MD     Referring MD: Hassell Done, [name], *  CC: Exertional Discomfort and DOE Consulted for the evaluation of DOE at the [employer], [place], [place]  History of Present Illness:    Peggy Smith is a 75 y.o. female with a hx HTN, HLD with mylagias on atorvastatin 80 mg, Tobacco Abuse who presents 02/07/21.  Patient notes that she is feeling DOE for a year.  Has had no chest pain, chest pressure, chest tightness, chest stinging, no jaw or throat discomfort, but has some discomfort.  Discomfort occurs with activity, worsens with severity of activity, and improves with rest.  Patient exertion notable for walking up the stairs and feels SOB.  No shortness of breath at rest.  No PND or orthopnea.  No bendopnea, weight gain, leg swelling , or abdominal swelling.  No syncope or near syncope . Notes  no palpitations or funny heart beats.   Notes that she lost her husband 06/2020 (Thanksgiving) to IPF and isn't sure how much of this is deconditioning and lack of activity.  This discomfort feels different than her 2012 CP (normal stress echo; felt to be panic attacks.  Past Medical History:  Diagnosis Date   Angina    STRESS TEST NORMAL-ANXIETY   Anxiety    Cardiac arrhythmia due to congenital heart disease    Depression    GERD (gastroesophageal reflux disease)    Headache(784.0)    OTC MEDS   Hepatitis     1976   Hyperlipidemia    DOES NOT TAKE MEDS - HAS THEM AT HOME BUT STATES DOES NOT TAKE   Hypertension    Hypothyroidism    Iron deficiency anemia    Melanoma ([place])    Panic attacks    PONV (postoperative nausea and vomiting)     Past Surgical History:  Procedure Laterality Date   COLONOSCOPY     CYSTOSCOPY  11/08/2011   Procedure: CYSTOSCOPY;  Surgeon: Delice Lesch, MD;  Location: [facility];  Service: Gynecology;  Laterality: N/A;   DILATION AND CURETTAGE OF UTERUS     LAPAROSCOPIC HYSTERECTOMY  11/08/2011   Procedure: HYSTERECTOMY TOTAL LAPAROSCOPIC;  Surgeon: Delice Lesch, MD;  Location: [facility];  Service: Gynecology;  Laterality: N/A;  with Bilateral Salpingo-Oophorectomies   MELANOMA EXCISION     Left Shoulder   SVD      X 4   TUBAL LIGATION     UPPER GASTROINTESTINAL ENDOSCOPY      Current Medications: Current Meds  Medication Sig   amLODipine (NORVASC) 5 MG tablet TAKE 1 TABLET BY MOUTH EVERY DAY   aspirin 81 MG tablet Take 81 mg by mouth every morning.   atorvastatin (LIPITOR) 40 MG tablet Take 40 mg by mouth 2 (two) times a week. Take Mon, Thur   calcium carbonate (OS-CAL) 600 MG TABS Take 600 mg by mouth every evening.   Cholecalciferol (VITAMIN D-3) 5000 UNITS TABS Take 5,000 Units by mouth every evening.   clonazePAM (KLONOPIN) 0.5 MG tablet Take 1 tablet (0.5 mg total) by mouth 2 (two) times daily as needed for anxiety.   escitalopram (LEXAPRO) 20 MG tablet TAKE 1 TABLET BY MOUTH EVERY DAY IN THE MORNING   fluticasone (FLONASE) 50 MCG/ACT nasal spray SPRAY 2 SPRAYS INTO EACH NOSTRIL  EVERY DAY   levothyroxine (SYNTHROID) 88 MCG tablet Take 1 tablet (88 mcg total) by mouth daily.   magnesium 30 MG tablet Take 1 tablet (30 mg total) by mouth daily.   metoprolol tartrate (LOPRESSOR) 100 MG tablet Take 2 hours prior to Cardiac CT   Multiple Vitamins-Minerals (CENTRUM SILVER PO) Take 1 tablet by mouth every morning.   omeprazole (PRILOSEC) 40 MG capsule Take 1 capsule (40 mg total) by mouth daily.   Riboflavin (VITAMIN B-2 PO) Take 100 mg by mouth every morning.   [DISCONTINUED] atorvastatin (LIPITOR) 40 MG tablet Take 1 tablet (40 mg total) by mouth daily. (Patient taking differently: Take 40 mg by mouth 2 (two) times a week. Take mon, thur)     Allergies:   Crestor [rosuvastatin], Macrodantin, Sulfa antibiotics, and Codeine    Social History   Socioeconomic History   Marital status: Widowed    Spouse name: [name]   Number of children: 4   Years of education: Not on file   Highest education level: Master's degree (e.g., MA, MS, MEng, MEd, MSW, MBA)  Occupational History   Occupation: paralegal    Employer: [employer]  Tobacco Use   Smoking status: Every Day    Packs/day: 0.25    Years: 30.00    Pack years: 7.50    Types: Cigarettes   Smokeless tobacco: Never  Substance and Sexual Activity   Alcohol use: No   Drug use: No   Sexual activity: Yes    Birth control/protection: Surgical  Other Topics Concern   Not on file  Social History Narrative   Lives alone, but one son stays with her a lot - Daughter lives in [place], other children live nearby   Social Determinants of Health   Financial Resource Strain: Low Risk    Difficulty of Paying Living Expenses: Not hard at all  Food Insecurity: No Food Insecurity   Worried About Charity fundraiser in the Last Year: Never true   Arboriculturist in the Last Year: Never true  Transportation Needs: No Transportation Needs   Lack of Transportation (Medical): No   Lack of Transportation (Non-Medical): No  Physical Activity: Inactive   Days of Exercise per Week: 0 days   Minutes of Exercise per Session: 0 min  Stress: No Stress Concern Present   Feeling of Stress : Not at all  Social Connections: Moderately Integrated   Frequency of Communication with Friends and Family: More than three times a week   Frequency of Social Gatherings with Friends and Family: More than three times a week   Attends Religious Services: More than 4 times per year   Active Member of [employer] or Organizations: Yes   Attends Archivist Meetings: More than 4 times per year   Marital Status: Widowed    Social: Lost her husband Thanksgiving 2021; 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 There is no history of Colon cancer or Breast cancer.  ROS:   Please see the history of present illness.     All other systems reviewed and are negative.  EKGs/Labs/Other Studies Reviewed:    The following studies were reviewed today:  EKG:  EKG is  ordered today.  The ekg ordered today demonstrates  02/07/21: SR 85 borderline ant inf  Echo Stress Testing : Date:01/08/11 Results: Negative Stress Echo for  ischemia  Recent Labs: 01/02/2021: ALT 13; BUN 13; Creatinine, Ser 1.07; Hemoglobin 12.8; Platelets 407; Potassium 5.4; Sodium 139; TSH 2.160  Recent Lipid Panel    Component Value Date/Time   CHOL 211 (H) 01/02/2021 1253   CHOL 255 (H) 01/19/2013 1620   TRIG 129 01/02/2021 1253   TRIG 264 (H) 11/09/2013 1716   TRIG 296 (H) 01/19/2013 1620   HDL 51 01/02/2021 1253   HDL 46 11/09/2013 1716   HDL 43 01/19/2013 1620   CHOLHDL 4.1 01/02/2021 1253   LDLCALC 137 (H) 01/02/2021 1253   LDLCALC 158 (H) 11/09/2013 1716   LDLCALC 153 (H) 01/19/2013 1620     Risk Assessment/Calculations:     N/A      Physical Exam:    VS:  BP 120/80   Pulse 85   Ht 5\' 8"  (1.727 m)   Wt 90.7 kg   SpO2 96%   BMI 30.41 kg/m     Wt Readings from Last 3 Encounters:  02/07/21 90.7 kg  01/10/21 88.9 kg  01/02/21 88.9 kg    GEN:  Well nourished, well developed in no acute distress HEENT: Normal NECK: No JVD; No carotid bruits LYMPHATICS: No lymphadenopathy CARDIAC: RRR, no murmurs, rubs, gallops RESPIRATORY:  Clear to auscultation without rales, wheezing or rhonchi  ABDOMEN: Soft, non-tender, non-distended MUSCULOSKELETAL:  No edema; No deformity  SKIN: Warm and dry NEUROLOGIC:  Alert and oriented x 3 PSYCHIATRIC:  Depressed mood  ASSESSMENT:    1. Precordial pain   2. DOE (dyspnea on exertion)   3. Mixed hyperlipidemia    PLAN:    Precordial CP DOE concerning for angina equivalent  Tobacco Abuse HTN and HLD -  Would recommend CCTA with possible FFR as needed to exclude obstructive CAD and to assess for non-obstructive CAD requiring secondary prevention (nitro and 100 mg metoprolol) - pre-contemplative for tobacco cessation at this time - discussed possibility of lung and cardiac findings  Three-four months follow up unless new symptoms or abnormal test results warranting change in plan  Would be reasonable for  APP Follow up         Medication Adjustments/Labs and Tests Ordered: Current medicines are reviewed at length with the patient today.  Concerns regarding medicines are outlined above.  Orders Placed This Encounter  Procedures   CT CORONARY MORPH W/CTA COR W/SCORE W/CA W/CM &/OR WO/CM   Basic metabolic panel   EKG 61-PJKD    Meds ordered this encounter  Medications   metoprolol tartrate (LOPRESSOR) 100 MG tablet    Sig: Take 2 hours prior to Cardiac CT    Dispense:  1 tablet    Refill:  0     Patient Instructions  Medication Instructions:  Your physician recommends that you continue on your current medications as directed. Please refer to the Current Medication list given to you today.  *If you need a refill on your cardiac medications before your next appointment, please call your pharmacy*   Lab Work: NONE If you have labs (blood work) drawn today and your tests are completely normal, you will receive your results only by: [place] (if you have MyChart) OR A paper copy in the mail If you have any lab test that is abnormal or we need to change your treatment, we will call you to review the results.   Testing/Procedures: Your physician has requested that you have cardiac CT. Cardiac computed tomography (CT) is a painless test that uses an x-ray machine to take clear,  detailed pictures of your heart. For further information please visit [email or web address].[email or web address]. Please follow instruction sheet as given.     Follow-Up: At [facility], you and your health needs  are our priority.  As part of our continuing mission to provide you with exceptional heart care, we have created designated Provider Care Teams.  These Care Teams include your primary Cardiologist (physician) and Advanced Practice Providers (APPs -  Physician Assistants and Nurse Practitioners) who all work together to provide you with the care you need, when you need it.  We recommend signing up for the patient portal called "MyChart".  Sign up information is provided on this After Visit Summary.  MyChart is used to connect with patients for Virtual Visits (Telemedicine).  Patients are able to view lab/test results, encounter notes, upcoming appointments, etc.  Non-urgent messages can be sent to your provider as well.   To learn more about what you can do with MyChart, go to [email or web address].    Your next appointment:   3-4  month(s)  The format for your next appointment:   In Person  Provider:   You may see Rudean Haskell, MD or one of the following Advanced Practice Providers on your designated Care Team:   Melina Copa, PA-C Ermalinda Barrios, PA-C   Other Instructions   Your cardiac CT will be scheduled at one of the below locations:   [facility] [address] [place], [place] [address] [phone]  [facility] [address] [place], [place] [address] [phone]  If scheduled at [facility], please arrive at the [facility] main entrance (entrance A) of [facility] 30 minutes prior to test start time. Proceed to the [facility] Radiology Department (first floor) to check-in and test prep.  If scheduled at [facility], please arrive 15 mins early for check-in and test prep.  Please follow these instructions carefully (unless otherwise directed):   On the Night Before the Test: Be sure to Drink plenty of water. Do not consume any caffeinated/decaffeinated  beverages or chocolate 12 hours prior to your test. Do not take any antihistamines (Flonase) 12 hours prior to your test.   On the Day of the Test: Drink plenty of water until 1 hour prior to the test. Do not eat any food 4 hours prior to the test. You may take your regular medications prior to the test.  Take metoprolol (Lopressor) 100 mg two hours prior to test. HOLD Furosemide/Hydrochlorothiazide morning of the test. FEMALES- please wear underwire-free bra if available       After the Test: Drink plenty of water. After receiving IV contrast, you may experience a mild flushed feeling. This is normal. On occasion, you may experience a mild rash up to 24 hours after the test. This is not dangerous. If this occurs, you can take Benadryl 25 mg and increase your fluid intake. If you experience trouble breathing, this can be serious. If it is severe call 911 IMMEDIATELY. If it is mild, please call our office. If you take any of these medications: Glipizide/Metformin, Avandament, Glucavance, please do not take 48 hours after completing test unless otherwise instructed.   Once we have confirmed authorization from your insurance company, we will call you to set up a date and time for your test. Based on how quickly your insurance processes prior authorizations requests, please allow up to 4 weeks to be contacted for scheduling your Cardiac CT appointment. Be  advised that routine Cardiac CT appointments could be scheduled as many as 8 weeks after your provider has ordered it.  For non-scheduling related questions, please contact the cardiac imaging nurse navigator should you have any questions/concerns: Marchia Bond, Cardiac Imaging Nurse Navigator Gordy Clement, Cardiac Imaging Nurse Navigator [facility] Direct Office Dial: [phone]   For scheduling needs, including cancellations and rescheduling, please call [place], [phone].     Signed, Werner Lean, MD  02/07/2021 5:09 PM    [facility]

## 2021-02-15 ENCOUNTER — Other Ambulatory Visit: Payer: Self-pay

## 2021-02-15 ENCOUNTER — Telehealth (HOSPITAL_COMMUNITY): Payer: Self-pay | Admitting: Emergency Medicine

## 2021-02-15 ENCOUNTER — Other Ambulatory Visit: Payer: Medicare PPO

## 2021-02-15 DIAGNOSIS — R072 Precordial pain: Secondary | ICD-10-CM | POA: Diagnosis not present

## 2021-02-15 NOTE — Telephone Encounter (Signed)
Reaching out to patient to offer assistance regarding upcoming cardiac imaging study; pt verbalizes understanding of appt date/time, parking situation and where to check in, pre-test NPO status and medications ordered, and verified current allergies; name and call back number provided for further questions should they arise Peggy Bond RN Navigator Cardiac Imaging [place] [facility] [phone] office [phone] cell   Pt reports claustro - taking full tab klonopin prior to scan (caregiver is driving) 100mg  metoprolol tart 2 hr prior to scan Holding flonase Getting labs at [facility] today 7/14

## 2021-02-16 LAB — BASIC METABOLIC PANEL
BUN/Creatinine Ratio: 16 (ref 12–28)
BUN: 16 mg/dL (ref 8–27)
CO2: 25 mmol/L (ref 20–29)
Calcium: 9.3 mg/dL (ref 8.7–10.3)
Chloride: 100 mmol/L (ref 96–106)
Creatinine, Ser: 0.97 mg/dL (ref 0.57–1.00)
Glucose: 100 mg/dL — ABNORMAL HIGH (ref 65–99)
Potassium: 4.6 mmol/L (ref 3.5–5.2)
Sodium: 140 mmol/L (ref 134–144)
eGFR: 61 mL/min/{1.73_m2} (ref >59)

## 2021-02-19 ENCOUNTER — Ambulatory Visit (HOSPITAL_COMMUNITY)
Admission: RE | Admit: 2021-02-19 | Discharge: 2021-02-19 | Disposition: A | Discharge status: Home / Self Care | Payer: Medicare PPO | Source: Ambulatory Visit | Attending: Internal Medicine | Admitting: Internal Medicine

## 2021-02-19 ENCOUNTER — Encounter (HOSPITAL_COMMUNITY): Payer: Self-pay

## 2021-02-19 ENCOUNTER — Other Ambulatory Visit: Payer: Self-pay

## 2021-02-19 DIAGNOSIS — R072 Precordial pain: Secondary | ICD-10-CM | POA: Diagnosis not present

## 2021-02-19 MED ORDER — IOHEXOL 350 MG/ML SOLN
95.0000 mL | Freq: Once | INTRAVENOUS | Status: AC | PRN
  Administered 2021-02-19: 95 mL via INTRAVENOUS

## 2021-02-19 MED ORDER — NITROGLYCERIN 0.4 MG SL SUBL
SUBLINGUAL_TABLET | SUBLINGUAL | Status: AC
  Filled 2021-02-19: qty 2

## 2021-02-19 MED ORDER — NITROGLYCERIN 0.4 MG SL SUBL
0.8000 mg | SUBLINGUAL_TABLET | Freq: Once | SUBLINGUAL | Status: AC
  Administered 2021-02-19: 0.8 mg via SUBLINGUAL

## 2021-02-19 NOTE — Progress Notes (Signed)
Patient tolerated CT well.  Vital signs stable encourage to drink water throughout day.Reasons explained and verbalized understanding.   

## 2021-02-20 NOTE — Telephone Encounter (Signed)
You ar ealready on lipitor which is on the same class as meds you mentioned. You can either start zetia or we can change you from lipitor to crestor, because crestor is stronger then lipitor. Your choice.

## 2021-03-12 ENCOUNTER — Ambulatory Visit
Admission: RE | Admit: 2021-03-12 | Discharge: 2021-03-12 | Disposition: A | Discharge status: Home / Self Care | Payer: Medicare PPO | Source: Ambulatory Visit | Attending: Nurse Practitioner | Admitting: Nurse Practitioner

## 2021-03-12 ENCOUNTER — Other Ambulatory Visit: Payer: Self-pay | Admitting: Family Medicine

## 2021-03-12 ENCOUNTER — Other Ambulatory Visit: Payer: Self-pay

## 2021-03-12 DIAGNOSIS — Z1231 Encounter for screening mammogram for malignant neoplasm of breast: Secondary | ICD-10-CM

## 2021-03-15 ENCOUNTER — Other Ambulatory Visit: Payer: Self-pay

## 2021-03-15 MED ORDER — EZETIMIBE 10 MG PO TABS: 10.0000 mg | ORAL_TABLET | Freq: Every day | ORAL | 3 refills | Status: DC

## 2021-03-15 NOTE — Progress Notes (Signed)
Pt sent in a my chart message requesting to start recommended Zetia 10 mg.  Orders placed.

## 2021-03-16 ENCOUNTER — Encounter: Payer: Self-pay | Admitting: Gastroenterology

## 2021-03-24 ENCOUNTER — Other Ambulatory Visit: Payer: Self-pay | Admitting: Nurse Practitioner

## 2021-03-24 DIAGNOSIS — F411 Generalized anxiety disorder: Secondary | ICD-10-CM

## 2021-04-16 ENCOUNTER — Encounter: Payer: Self-pay | Admitting: Nurse Practitioner

## 2021-04-16 ENCOUNTER — Other Ambulatory Visit: Payer: Self-pay

## 2021-04-16 ENCOUNTER — Ambulatory Visit: Payer: Medicare PPO | Admitting: Nurse Practitioner

## 2021-04-16 VITALS — BP 120/77 | HR 78 | Temp 97.3°F | Resp 20 | Ht 68.0 in | Wt 196.0 lb

## 2021-04-16 DIAGNOSIS — D509 Iron deficiency anemia, unspecified: Secondary | ICD-10-CM | POA: Diagnosis not present

## 2021-04-16 DIAGNOSIS — F411 Generalized anxiety disorder: Secondary | ICD-10-CM | POA: Diagnosis not present

## 2021-04-16 DIAGNOSIS — E782 Mixed hyperlipidemia: Secondary | ICD-10-CM

## 2021-04-16 DIAGNOSIS — K219 Gastro-esophageal reflux disease without esophagitis: Secondary | ICD-10-CM | POA: Diagnosis not present

## 2021-04-16 DIAGNOSIS — F410 Panic disorder [episodic paroxysmal anxiety] without agoraphobia: Secondary | ICD-10-CM

## 2021-04-16 DIAGNOSIS — Z6829 Body mass index (BMI) 29.0-29.9, adult: Secondary | ICD-10-CM

## 2021-04-16 DIAGNOSIS — E039 Hypothyroidism, unspecified: Secondary | ICD-10-CM | POA: Diagnosis not present

## 2021-04-16 DIAGNOSIS — I10 Essential (primary) hypertension: Secondary | ICD-10-CM

## 2021-04-16 DIAGNOSIS — F17200 Nicotine dependence, unspecified, uncomplicated: Secondary | ICD-10-CM | POA: Diagnosis not present

## 2021-04-16 MED ORDER — EZETIMIBE 10 MG PO TABS: 10.0000 mg | ORAL_TABLET | Freq: Every day | ORAL | 1 refills | Status: DC

## 2021-04-16 MED ORDER — LEVOTHYROXINE SODIUM 88 MCG PO TABS: 88.0000 ug | ORAL_TABLET | Freq: Every day | ORAL | 1 refills | Status: DC

## 2021-04-16 MED ORDER — ATORVASTATIN CALCIUM 40 MG PO TABS: 40.0000 mg | ORAL_TABLET | ORAL | 1 refills | Status: DC

## 2021-04-16 MED ORDER — CLONAZEPAM 0.5 MG PO TABS: 0.5000 mg | ORAL_TABLET | Freq: Two times a day (BID) | ORAL | 5 refills | Status: DC | PRN

## 2021-04-16 MED ORDER — AMLODIPINE BESYLATE 5 MG PO TABS: ORAL_TABLET | ORAL | 1 refills | Status: DC

## 2021-04-16 MED ORDER — ESCITALOPRAM OXALATE 20 MG PO TABS: ORAL_TABLET | ORAL | 1 refills | Status: DC

## 2021-04-16 MED ORDER — OMEPRAZOLE 40 MG PO CPDR: 40.0000 mg | DELAYED_RELEASE_CAPSULE | Freq: Every day | ORAL | 1 refills | Status: DC

## 2021-04-16 NOTE — Progress Notes (Signed)
Subjective:    Patient ID: Peggy Smith, female    DOB: June 05, 1946, 75 y.o.   MRN: [id number]  Chief Complaint: Medical Management of Chronic Issues    HPI:  1. Primary hypertension No c/o chest pain, sob or headache. Does not check blood pressure at home. BP Readings from Last 3 Encounters:  04/16/21 120/77  02/19/21 111/66  02/07/21 120/80     2. Mixed hyperlipidemia Does try to watch diet but does no dedicated exercise. Lab Results  Component Value Date   CHOL 211 (H) 01/02/2021   HDL 51 01/02/2021   LDLCALC 137 (H) 01/02/2021   TRIG 129 01/02/2021   CHOLHDL 4.1 01/02/2021     3. Gastroesophageal reflux disease without esophagitis Is on omperazole daily and is doing well.  4. Acquired hypothyroidism No problems that aware of. Lab Results  Component Value Date   TSH 2.160 01/02/2021     5. Iron deficiency anemia, unspecified iron deficiency anemia type No c/o fatigue Lab Results  Component Value Date   HGB 12.8 01/02/2021     6. Anxiety state Has a long history of anxiety. She is doing well.  GAD 7 : Generalized Anxiety Score 04/16/2021 01/02/2021 06/23/2020 12/22/2019  Nervous, Anxious, on Edge 0 0 3 0  Control/stop worrying 0 0 3 1  Worry too much - different things 0 0 3 1  Trouble relaxing '1 1 1 ' 0  Restless 0 0 0 0  Easily annoyed or irritable 0 0 0 0  Afraid - awful might happen 0 0 0 0  Total GAD 7 Score '1 1 10 2  ' Anxiety Difficulty Not difficult at all Not difficult at all Not difficult at all Not difficult at all      7. Panic attacks Has had no recent episodes  8. Smoking Has no desire to quit smoking  9. BMI 29.0-29.9,adult No recent weight changes Wt Readings from Last 3 Encounters:  04/16/21 196 lb (88.9 kg)  02/07/21 200 lb (90.7 kg)  01/10/21 196 lb (88.9 kg)   BMI Readings from Last 3 Encounters:  04/16/21 29.80 kg/m  02/07/21 30.41 kg/m  01/10/21 29.80 kg/m       Outpatient Encounter Medications as of  04/16/2021  Medication Sig   amLODipine (NORVASC) 5 MG tablet TAKE 1 TABLET BY MOUTH EVERY DAY   aspirin 81 MG tablet Take 81 mg by mouth every morning.   atorvastatin (LIPITOR) 40 MG tablet Take 40 mg by mouth 2 (two) times a week. Take Mon, Thur   calcium carbonate (OS-CAL) 600 MG TABS Take 600 mg by mouth every evening.   Cholecalciferol (VITAMIN D-3) 5000 UNITS TABS Take 5,000 Units by mouth every evening.   clonazePAM (KLONOPIN) 0.5 MG tablet Take 1 tablet (0.5 mg total) by mouth 2 (two) times daily as needed for anxiety.   escitalopram (LEXAPRO) 20 MG tablet TAKE 1 TABLET BY MOUTH EVERY DAY IN THE MORNING   ezetimibe (ZETIA) 10 MG tablet Take 1 tablet (10 mg total) by mouth daily.   fluticasone (FLONASE) 50 MCG/ACT nasal spray SPRAY 2 SPRAYS INTO EACH NOSTRIL EVERY DAY   levothyroxine (SYNTHROID) 88 MCG tablet Take 1 tablet (88 mcg total) by mouth daily.   magnesium 30 MG tablet Take 1 tablet (30 mg total) by mouth daily.   Multiple Vitamins-Minerals (CENTRUM SILVER PO) Take 1 tablet by mouth every morning.   omeprazole (PRILOSEC) 40 MG capsule Take 1 capsule (40 mg total) by mouth daily.   Riboflavin (  VITAMIN B-2 PO) Take 100 mg by mouth every morning.   [DISCONTINUED] metoprolol tartrate (LOPRESSOR) 100 MG tablet Take 2 hours prior to Cardiac CT   No facility-administered encounter medications on file as of 04/16/2021.    Past Surgical History:  Procedure Laterality Date   COLONOSCOPY     CYSTOSCOPY  11/08/2011   Procedure: CYSTOSCOPY;  Surgeon: Delice Lesch, MD;  Location: [facility];  Service: Gynecology;  Laterality: N/A;   DILATION AND CURETTAGE OF UTERUS     LAPAROSCOPIC HYSTERECTOMY  11/08/2011   Procedure: HYSTERECTOMY TOTAL LAPAROSCOPIC;  Surgeon: Delice Lesch, MD;  Location: [facility];  Service: Gynecology;  Laterality: N/A;  with Bilateral Salpingo-Oophorectomies   MELANOMA EXCISION     Left Shoulder   SVD      X 4   TUBAL LIGATION     UPPER GASTROINTESTINAL ENDOSCOPY       Family History  Problem Relation Age of Onset   Dementia Mother    Brain cancer Father    Anxiety disorder Brother    Asthma Son    Colon cancer Neg Hx    Breast cancer Neg Hx     New complaints: None today  Social history: Lives by herself now since her husband passed away.  Controlled substance contract: n/a     Review of Systems  Constitutional:  Negative for diaphoresis.  Eyes:  Negative for pain.  Respiratory:  Negative for shortness of breath.   Cardiovascular:  Negative for chest pain, palpitations and leg swelling.  Gastrointestinal:  Negative for abdominal pain.  Endocrine: Negative for polydipsia.  Skin:  Negative for rash.  Neurological:  Negative for dizziness, weakness and headaches.  Hematological:  Does not bruise/bleed easily.  All other systems reviewed and are negative.     Objective:   Physical Exam Vitals and nursing note reviewed.  Constitutional:      General: She is not in acute distress.    Appearance: Normal appearance. She is well-developed.  HENT:     Head: Normocephalic.     Right Ear: Tympanic membrane normal.     Left Ear: Tympanic membrane normal.     Nose: Nose normal.     Mouth/Throat:     Mouth: Mucous membranes are moist.  Eyes:     Pupils: Pupils are equal, round, and reactive to light.  Neck:     Vascular: No carotid bruit or JVD.  Cardiovascular:     Rate and Rhythm: Normal rate and regular rhythm.     Heart sounds: Normal heart sounds.  Pulmonary:     Effort: Pulmonary effort is normal. No respiratory distress.     Breath sounds: Normal breath sounds. No wheezing or rales.  Chest:     Chest wall: No tenderness.  Abdominal:     General: Bowel sounds are normal. There is no distension or abdominal bruit.     Palpations: Abdomen is soft. There is no hepatomegaly, splenomegaly, mass or pulsatile mass.     Tenderness: There is no abdominal tenderness.  Musculoskeletal:        General: Normal range of motion.      Cervical back: Normal range of motion and neck supple.  Lymphadenopathy:     Cervical: No cervical adenopathy.  Skin:    General: Skin is warm and dry.  Neurological:     Mental Status: She is alert and oriented to person, place, and time.     Deep Tendon Reflexes: Reflexes are normal and symmetric.  Psychiatric:        Behavior: Behavior normal.        Thought Content: Thought content normal.        Judgment: Judgment normal.    BP 120/77   Pulse 78   Temp (!) 97.3 F (36.3 C) (Temporal)   Resp 20   Ht '5\' 8"'  (1.727 m)   Wt 196 lb (88.9 kg)   SpO2 94%   BMI 29.80 kg/m        Assessment & Plan:   LEILENE DIPRIMA comes in today with chief complaint of Medical Management of Chronic Issues   Diagnosis and orders addressed:  1. Primary hypertension Low sodium diet - amLODipine (NORVASC) 5 MG tablet; TAKE 1 TABLET BY MOUTH EVERY DAY  Dispense: 90 tablet; Refill: 1 - CBC with Differential/Platelet - CMP14+EGFR  2. Mixed hyperlipidemia Low fat diet - Lipid panel  3. Gastroesophageal reflux disease without esophagitis Avoid spicy foods Do not eat 2 hours prior to bedtime - omeprazole (PRILOSEC) 40 MG capsule; Take 1 capsule (40 mg total) by mouth daily.  Dispense: 90 capsule; Refill: 1  4. Acquired hypothyroidism Labs pending - levothyroxine (SYNTHROID) 88 MCG tablet; Take 1 tablet (88 mcg total) by mouth daily.  Dispense: 90 tablet; Refill: 1 - Thyroid Panel With TSH  5. Iron deficiency anemia, unspecified iron deficiency anemia type Labs pending  6. Anxiety state Stress management - clonazePAM (KLONOPIN) 0.5 MG tablet; Take 1 tablet (0.5 mg total) by mouth 2 (two) times daily as needed for anxiety.  Dispense: 60 tablet; Refill: 5  7. Panic attacks - escitalopram (LEXAPRO) 20 MG tablet; TAKE 1 TABLET BY MOUTH EVERY DAY IN THE MORNING  Dispense: 90 tablet; Refill: 1  8. Smoking Smoking cessation encouraged  9. BMI 29.0-29.9,adult Discussed diet and  exercise for person with BMI >25 Will recheck weight in 3-6 months   Labs pending Health Maintenance reviewed Diet and exercise encouraged  Follow up plan: 6 months   [name] Hassell Done, FNP

## 2021-04-16 NOTE — Patient Instructions (Signed)
Stress, Adult Stress is a normal reaction to life events. Stress is what you feel when life demands more than you are used to, or more than you think you can handle. Some stress can be useful, such as studying for a test or meeting a deadline at work. Stress that occurs too often or for too long can cause problems. It can affect your emotional health and interfere with relationships and normal daily activities. Too much stress can weaken your body's defense system (immune system) and increase your risk for physical illness. If you already have a medical problem, stress can make it worse. What are the causes? All sorts of life events can cause stress. An event that causes stress for one person may not be stressful for another person. Major life events, whether positive or negative, commonly cause stress. Examples include: Losing a job or starting a new job. Losing a loved one. Moving to a new town or home. Getting married or divorced. Having a baby. Getting injured or sick. Less obvious life events can also cause stress, especially if they occur day after day or in combination with each other. Examples include: Working long hours. Driving in traffic. Caring for children. Being in debt. Being in a difficult relationship. What are the signs or symptoms? Stress can cause emotional symptoms, including: Anxiety. This is feeling worried, afraid, on edge, overwhelmed, or out of control. Anger, including irritation or impatience. Depression. This is feeling sad, down, helpless, or guilty. Trouble focusing, remembering, or making decisions. Stress can cause physical symptoms, including: Aches and pains. These may affect your head, neck, back, stomach, or other areas of your body. Tight muscles or a clenched jaw. Low energy. Trouble sleeping. Stress can cause unhealthy behaviors, including: Eating to feel better (overeating) or skipping meals. Working too much or putting off tasks. Smoking,  drinking alcohol, or using drugs to feel better. How is this diagnosed? Stress is diagnosed through an assessment by your health care provider. He or she may diagnose this condition based on: Your symptoms and any stressful life events. Your medical history. Tests to rule out other causes of your symptoms. Depending on your condition, your health care provider may refer you to a specialist for further evaluation. How is this treated? Stress management techniques are the recommended treatment for stress. Medicine is not typically recommended for the treatment of stress. Techniques to reduce your reaction to stressful life events include: Stress identification. Monitor yourself for symptoms of stress and identify what causes stress for you. These skills may help you to avoid or prepare for stressful events. Time management. Set your priorities, keep a calendar of events, and learn to say no. Taking these actions can help you avoid making too many commitments. Techniques for coping with stress include: Rethinking the problem. Try to think realistically about stressful events rather than ignoring them or overreacting. Try to find the positives in a stressful situation rather than focusing on the negatives. Exercise. Physical exercise can release both physical and emotional tension. The key is to find a form of exercise that you enjoy and do it regularly. Relaxation techniques. These relax the body and mind. The key is to find one or more that you enjoy and use the techniques regularly. Examples include: Meditation, deep breathing, or progressive relaxation techniques. Yoga or tai chi. Biofeedback, mindfulness techniques, or journaling. Listening to music, being out in nature, or participating in other hobbies. Practicing a healthy lifestyle. Eat a balanced diet, drink plenty of water, limit or  avoid caffeine, and get plenty of sleep. Having a strong support network. Spend time with family, friends,  or other people you enjoy being around. Express your feelings and talk things over with someone you trust. Counseling or talk therapy with a mental health professional may be helpful if you are having trouble managing stress on your own. Follow these instructions at home: Lifestyle  Avoid drugs. Do not use any products that contain nicotine or tobacco, such as cigarettes, e-cigarettes, and chewing tobacco. If you need help quitting, ask your health care provider. Limit alcohol intake to no more than 1 drink a day for nonpregnant women and 2 drinks a day for men. One drink equals 12 oz of beer, 5 oz of wine, or 1 oz of hard liquor Do not use alcohol or drugs to relax. Eat a balanced diet that includes fresh fruits and vegetables, whole grains, lean meats, fish, eggs, and beans, and low-fat dairy. Avoid processed foods and foods high in added fat, sugar, and salt. Exercise at least 30 minutes on 5 or more days each week. Get 7-8 hours of sleep each night. General instructions  Practice stress management techniques as discussed with your health care provider. Drink enough fluid to keep your urine clear or pale yellow. Take over-the-counter and prescription medicines only as told by your health care provider. Keep all follow-up visits as told by your health care provider. This is important. Contact a health care provider if: Your symptoms get worse. You have new symptoms. You feel overwhelmed by your problems and can no longer manage them on your own. Get help right away if: You have thoughts of hurting yourself or others. If you ever feel like you may hurt yourself or others, or have thoughts about taking your own life, get help right away. You can go to your nearest emergency department or call: Your local emergency services (911 in the [place] A suicide crisis helpline, such as the [place] at (410)025-6148. This is open 24 hours a day. Summary Stress is a  normal reaction to life events. It can cause problems if it happens too often or for too long. Practicing stress management techniques is the best way to treat stress. Counseling or talk therapy with a mental health professional may be helpful if you are having trouble managing stress on your own. This information is not intended to replace advice given to you by your health care provider. Make sure you discuss any questions you have with your health care provider. Document Revised: 09/29/2020 Document Reviewed: 04/07/2020 Elsevier Patient Education  2022 [employer].

## 2021-04-17 LAB — CBC WITH DIFFERENTIAL/PLATELET
Basophils Absolute: 0.1 10*3/uL (ref 0.0–0.2)
Basos: 2 %
EOS (ABSOLUTE): 0.2 10*3/uL (ref 0.0–0.4)
Eos: 3 %
Hematocrit: 38.2 % (ref 34.0–46.6)
Hemoglobin: 12.4 g/dL (ref 11.1–15.9)
Immature Grans (Abs): 0 10*3/uL (ref 0.0–0.1)
Immature Granulocytes: 0 %
Lymphocytes Absolute: 2.8 10*3/uL (ref 0.7–3.1)
Lymphs: 36 %
MCH: 26.2 pg — ABNORMAL LOW (ref 26.6–33.0)
MCHC: 32.5 g/dL (ref 31.5–35.7)
MCV: 81 fL (ref 79–97)
Monocytes Absolute: 0.6 10*3/uL (ref 0.1–0.9)
Monocytes: 8 %
Neutrophils Absolute: 4.1 10*3/uL (ref 1.4–7.0)
Neutrophils: 51 %
Platelets: 417 10*3/uL (ref 150–450)
RBC: 4.74 x10E6/uL (ref 3.77–5.28)
RDW: 13.9 % (ref 11.7–15.4)
WBC: 7.9 10*3/uL (ref 3.4–10.8)

## 2021-04-17 LAB — CMP14+EGFR
ALT: 14 IU/L (ref 0–32)
AST: 19 IU/L (ref 0–40)
Albumin/Globulin Ratio: 1.9 (ref 1.2–2.2)
Albumin: 4.6 g/dL (ref 3.7–4.7)
Alkaline Phosphatase: 119 IU/L (ref 44–121)
BUN/Creatinine Ratio: 11 — ABNORMAL LOW (ref 12–28)
BUN: 10 mg/dL (ref 8–27)
Bilirubin Total: 0.2 mg/dL (ref 0.0–1.2)
CO2: 24 mmol/L (ref 20–29)
Calcium: 9.6 mg/dL (ref 8.7–10.3)
Chloride: 100 mmol/L (ref 96–106)
Creatinine, Ser: 0.93 mg/dL (ref 0.57–1.00)
Globulin, Total: 2.4 g/dL (ref 1.5–4.5)
Glucose: 95 mg/dL (ref 65–99)
Potassium: 4.3 mmol/L (ref 3.5–5.2)
Sodium: 140 mmol/L (ref 134–144)
Total Protein: 7 g/dL (ref 6.0–8.5)
eGFR: 64 mL/min/{1.73_m2} (ref >59)

## 2021-04-17 LAB — THYROID PANEL WITH TSH
Free Thyroxine Index: 2.4 (ref 1.2–4.9)
T3 Uptake Ratio: 26 % (ref 24–39)
T4, Total: 9.4 ug/dL (ref 4.5–12.0)
TSH: 1.88 u[IU]/mL (ref 0.450–4.500)

## 2021-04-17 LAB — LIPID PANEL
Chol/HDL Ratio: 3.5 ratio (ref 0.0–4.4)
Cholesterol, Total: 166 mg/dL (ref 100–199)
HDL: 48 mg/dL (ref >39)
LDL Chol Calc (NIH): 94 mg/dL (ref 0–99)
Triglycerides: 134 mg/dL (ref 0–149)
VLDL Cholesterol Cal: 24 mg/dL (ref 5–40)

## 2021-05-28 ENCOUNTER — Other Ambulatory Visit: Payer: Self-pay

## 2021-05-28 DIAGNOSIS — E782 Mixed hyperlipidemia: Secondary | ICD-10-CM

## 2021-05-28 NOTE — Progress Notes (Signed)
Pt sent in a my chart message requesting to have labs drawn at PCP office.  I placed orders for labs to be drawn at PCP office.  Pt expresses that PCP office uses [facility].

## 2021-06-05 ENCOUNTER — Other Ambulatory Visit: Payer: Medicare PPO

## 2021-06-05 ENCOUNTER — Other Ambulatory Visit: Payer: Self-pay

## 2021-06-05 DIAGNOSIS — E782 Mixed hyperlipidemia: Secondary | ICD-10-CM | POA: Diagnosis not present

## 2021-06-06 ENCOUNTER — Encounter: Payer: Self-pay | Admitting: Internal Medicine

## 2021-06-06 ENCOUNTER — Ambulatory Visit: Payer: Medicare PPO | Admitting: Internal Medicine

## 2021-06-06 VITALS — BP 110/60 | HR 89 | Ht 68.0 in | Wt 196.0 lb

## 2021-06-06 DIAGNOSIS — I2510 Atherosclerotic heart disease of native coronary artery without angina pectoris: Secondary | ICD-10-CM

## 2021-06-06 DIAGNOSIS — I10 Essential (primary) hypertension: Secondary | ICD-10-CM | POA: Diagnosis not present

## 2021-06-06 DIAGNOSIS — E782 Mixed hyperlipidemia: Secondary | ICD-10-CM | POA: Diagnosis not present

## 2021-06-06 DIAGNOSIS — Z720 Tobacco use: Secondary | ICD-10-CM | POA: Diagnosis not present

## 2021-06-06 LAB — HEPATIC FUNCTION PANEL
ALT: 14 IU/L (ref 0–32)
AST: 17 IU/L (ref 0–40)
Albumin: 4.5 g/dL (ref 3.7–4.7)
Alkaline Phosphatase: 105 IU/L (ref 44–121)
Bilirubin Total: 0.3 mg/dL (ref 0.0–1.2)
Bilirubin, Direct: <0.1 mg/dL (ref 0.00–0.40)
Total Protein: 7 g/dL (ref 6.0–8.5)

## 2021-06-06 LAB — LIPID PANEL
Chol/HDL Ratio: 3.2 ratio (ref 0.0–4.4)
Cholesterol, Total: 161 mg/dL (ref 100–199)
HDL: 51 mg/dL (ref >39)
LDL Chol Calc (NIH): 87 mg/dL (ref 0–99)
Triglycerides: 133 mg/dL (ref 0–149)
VLDL Cholesterol Cal: 23 mg/dL (ref 5–40)

## 2021-06-06 NOTE — Patient Instructions (Signed)
Medication Instructions:  Your physician recommends that you continue on your current medications as directed. Please refer to the Current Medication list given to you today.  *If you need a refill on your cardiac medications before your next appointment, please call your pharmacy*   Lab Work: None Ordered If you have labs (blood work) drawn today and your tests are completely normal, you will receive your results only by: [place] (if you have MyChart) OR A paper copy in the mail If you have any lab test that is abnormal or we need to change your treatment, we will call you to review the results.   Testing/Procedures: None Ordered   Follow-Up: You have been referred to Lipid Clinic - Please schedule appointment January 2023   At [facility], you and your health needs are our priority.  As part of our continuing mission to provide you with exceptional heart care, we have created designated Provider Care Teams.  These Care Teams include your primary Cardiologist (physician) and Advanced Practice Providers (APPs -  Physician Assistants and Nurse Practitioners) who all work together to provide you with the care you need, when you need it.   Your next appointment:   6 month(s)  The format for your next appointment:   In Person  Provider:   You may see Werner Lean, MD or one of the following Advanced Practice Providers on your designated Care Team:   Melina Copa, PA-C Ermalinda Barrios, PA-C

## 2021-06-06 NOTE — Progress Notes (Signed)
Cardiology Office Note:    Date:  06/06/2021   ID:  Peggy Smith, DOB 26-Oct-1945, MRN [id number]  PCP:  Chevis Pretty, [facility] Providers Cardiologist:  Werner Lean, MD     Referring MD: Hassell Done, [name], *  CC: Follow up Cardiac CT  History of Present Illness:    Peggy Smith is a 75 y.o. female with a hx HTN, HLD with mylagias on atorvastatin 80 mg, Tobacco Abuse who presents 02/07/21. In interim of this visit, patient cardiac CT with mild non obstructive CAD.  Started Zetia 10 given 2 failed statins. Seen 06/06/21.  Patient notes that she is doing OK.  Since last visit notes notes no breathing changes. There are no interval hospital/ED visit.    No chest pain or pressure .  Still has DOE and no PND/Orthopnea.  No weight gain or leg swelling.  No palpitations or syncope.  Has able to walk around [place] and [place] and had DOE but tolerated things ok.   Past Medical History:  Diagnosis Date   Angina    STRESS TEST NORMAL-ANXIETY   Anxiety    Cardiac arrhythmia due to congenital heart disease    Depression    GERD (gastroesophageal reflux disease)    Headache(784.0)    OTC MEDS   Hepatitis     1976   Hyperlipidemia    DOES NOT TAKE MEDS - HAS THEM AT HOME BUT STATES DOES NOT TAKE   Hypertension    Hypothyroidism    Iron deficiency anemia    Melanoma ([place])    Panic attacks    PONV (postoperative nausea and vomiting)     Past Surgical History:  Procedure Laterality Date   COLONOSCOPY     CYSTOSCOPY  11/08/2011   Procedure: CYSTOSCOPY;  Surgeon: Delice Lesch, MD;  Location: [facility];  Service: Gynecology;  Laterality: N/A;   DILATION AND CURETTAGE OF UTERUS     LAPAROSCOPIC HYSTERECTOMY  11/08/2011   Procedure: HYSTERECTOMY TOTAL LAPAROSCOPIC;  Surgeon: Delice Lesch, MD;  Location: [facility];  Service: Gynecology;  Laterality: N/A;  with Bilateral Salpingo-Oophorectomies   MELANOMA EXCISION     Left Shoulder   SVD      X 4    TUBAL LIGATION     UPPER GASTROINTESTINAL ENDOSCOPY      Current Medications: Current Meds  Medication Sig   amLODipine (NORVASC) 5 MG tablet TAKE 1 TABLET BY MOUTH EVERY DAY   aspirin 81 MG tablet Take 81 mg by mouth every morning.   atorvastatin (LIPITOR) 40 MG tablet Take 1 tablet (40 mg total) by mouth 2 (two) times a week. Take Mon, Thur   calcium carbonate (OS-CAL) 600 MG TABS Take 600 mg by mouth every evening.   Cholecalciferol (VITAMIN D-3) 5000 UNITS TABS Take 5,000 Units by mouth every evening.   clonazePAM (KLONOPIN) 0.5 MG tablet Take 1 tablet (0.5 mg total) by mouth 2 (two) times daily as needed for anxiety.   escitalopram (LEXAPRO) 20 MG tablet Take 20 mg by mouth at bedtime.   ezetimibe (ZETIA) 10 MG tablet Take 1 tablet (10 mg total) by mouth daily.   fluticasone (FLONASE) 50 MCG/ACT nasal spray SPRAY 2 SPRAYS INTO EACH NOSTRIL EVERY DAY   levothyroxine (SYNTHROID) 88 MCG tablet Take 1 tablet (88 mcg total) by mouth daily.   magnesium 30 MG tablet Take 1 tablet (30 mg total) by mouth daily.   Multiple Vitamins-Minerals (CENTRUM SILVER PO) Take 1 tablet  by mouth every morning.   omeprazole (PRILOSEC) 40 MG capsule Take 1 capsule (40 mg total) by mouth daily.   Riboflavin (VITAMIN B-2 PO) Take 100 mg by mouth every morning.   [DISCONTINUED] escitalopram (LEXAPRO) 20 MG tablet TAKE 1 TABLET BY MOUTH EVERY DAY IN THE MORNING (Patient taking differently: TAKE 1 TABLET BY MOUTH EVERY DAY at bedtime)     Allergies:   Crestor [rosuvastatin], Macrodantin, Sulfa antibiotics, and Codeine   Social History   Socioeconomic History   Marital status: Widowed    Spouse name: [name]   Number of children: 4   Years of education: Not on file   Highest education level: Master's degree (e.g., MA, MS, MEng, MEd, MSW, MBA)  Occupational History   Occupation: paralegal    Employer: [employer]  Tobacco Use   Smoking status: Every Day    Packs/day: 0.25    Years: 30.00     Pack years: 7.50    Types: Cigarettes   Smokeless tobacco: Never  Substance and Sexual Activity   Alcohol use: No   Drug use: No   Sexual activity: Yes    Birth control/protection: Surgical  Other Topics Concern   Not on file  Social History Narrative   Lives alone, but one son stays with her a lot - Daughter lives in [place], other children live nearby   Social Determinants of Health   Financial Resource Strain: Low Risk    Difficulty of Paying Living Expenses: Not hard at all  Food Insecurity: No Food Insecurity   Worried About Charity fundraiser in the Last Year: Never true   Arboriculturist in the Last Year: Never true  Transportation Needs: No Transportation Needs   Lack of Transportation (Medical): No   Lack of Transportation (Non-Medical): No  Physical Activity: Inactive   Days of Exercise per Week: 0 days   Minutes of Exercise per Session: 0 min  Stress: No Stress Concern Present   Feeling of Stress : Not at all  Social Connections: Moderately Integrated   Frequency of Communication with Friends and Family: More than three times a week   Frequency of Social Gatherings with Friends and Family: More than three times a week   Attends Religious Services: More than 4 times per year   Active Member of [employer] or Organizations: Yes   Attends Archivist Meetings: More than 4 times per year   Marital Status: Widowed    Social: Lost her husband Thanksgiving 2021; family makes [place], [place] to trip with her daughter in [place]   Family History: The patient's family history includes Anxiety disorder in her brother; Asthma in her son; Brain cancer in her father; Dementia in her mother. There is no history of Colon cancer or Breast cancer.  ROS:   Please see the history of present illness.     All other systems reviewed and are negative.  EKGs/Labs/Other Studies Reviewed:    The following studies were reviewed today:  EKG:  EKG is  ordered today.  The ekg ordered today  demonstrates  02/07/21: SR 85 borderline ant inf  Echo Stress Testing : Date:01/08/11 Results: Negative Stress Echo for ischemia  Cardiac CT: Date: 02/19/21 Results: IMPRESSION: 1. Coronary calcium score of 144. This was 66th percentile for age and sex matched control.   2. Normal coronary origin with right dominance.   3. Nonobstructive CAD   4. Mixed plaque in proximal LAD causes mild (25-49%) stenosis. High risk  plaque features including positive remodeling, spotty calcification, and low attenuation plaque   5. Calcified plaque causes minimal (0-24%) stenosis in the proximal RCA, mid LAD, and proximal LCX   6. Mild dilatation of ascending aorta measuring 46mm   CAD-RADS 2. Mild non-obstructive CAD (25-49%). Consider non-atherosclerotic causes of chest pain. Consider preventive therapy and risk factor modification.    Recent Labs: 04/16/2021: BUN 10; Creatinine, Ser 0.93; Hemoglobin 12.4; Platelets 417; Potassium 4.3; Sodium 140; TSH 1.880 06/05/2021: ALT 14  Recent Lipid Panel    Component Value Date/Time   CHOL 161 06/05/2021 0807   CHOL 255 (H) 01/19/2013 1620   TRIG 133 06/05/2021 0807   TRIG 264 (H) 11/09/2013 1716   TRIG 296 (H) 01/19/2013 1620   HDL 51 06/05/2021 0807   HDL 46 11/09/2013 1716   HDL 43 01/19/2013 1620   CHOLHDL 3.2 06/05/2021 0807   LDLCALC 87 06/05/2021 0807   LDLCALC 158 (H) 11/09/2013 1716   LDLCALC 153 (H) 01/19/2013 1620       Physical Exam:    VS:  BP 110/60   Pulse 89   Ht 5\' 8"  (1.727 m)   Wt 196 lb (88.9 kg)   SpO2 97%   BMI 29.80 kg/m     Wt Readings from Last 3 Encounters:  06/06/21 196 lb (88.9 kg)  04/16/21 196 lb (88.9 kg)  02/07/21 200 lb (90.7 kg)    GEN:  Well nourished, well developed in no acute distress HEENT: Normal NECK: No JVD  LYMPHATICS: No lymphadenopathy CARDIAC: RRR, no murmurs, rubs, gallops RESPIRATORY:  Clear to auscultation without rales, wheezing or rhonchi  ABDOMEN: Soft, non-tender,  non-distended MUSCULOSKELETAL:  No edema; No deformity  SKIN: Warm and dry NEUROLOGIC:  Alert and oriented x 3 PSYCHIATRIC:  Depressed mood  ASSESSMENT:    1. Coronary artery disease involving native coronary artery of native heart without angina pectoris   2. Tobacco abuse   3. Mixed hyperlipidemia   4. Primary hypertension     PLAN:    Mild Non-obstructive CAD Tobacco Abuse HTN and HLD - On maximally tolerated Atorvastatin (cannot take rosuvastatin) and Zetia LDL has improved but is not at goal - contemplative for tobacco cessation at this time; will attempt in new year after the anniversary of the death of her husband (Thanksgiving) and sister in law (Christmas - Lipid clinic in January for potential bempedoic acid start - Reviewed cardiac imaging with patient   Will plan for 6 months follow up unless new symptoms or abnormal test results warranting change in plan  Would be reasonable for  APP Follow up  Time Spent Directly with Patient:   I have spent a total of 40 minutes with the patient reviewing notes, imaging, EKGs, labs and examining the patient as well as establishing an assessment and plan that was discussed personally with the patient.  > 50% of time was spent in direct patient care and reviewing imaging with patient (cardiac CT).    Medication Adjustments/Labs and Tests Ordered: Current medicines are reviewed at length with the patient today.  Concerns regarding medicines are outlined above.  No orders of the defined types were placed in this encounter.   No orders of the defined types were placed in this encounter.    Patient Instructions  Medication Instructions:  Your physician recommends that you continue on your current medications as directed. Please refer to the Current Medication list given to you today.  *If you need a refill on your cardiac  medications before your next appointment, please call your pharmacy*   Lab Work: None Ordered If you  have labs (blood work) drawn today and your tests are completely normal, you will receive your results only by: [place] (if you have MyChart) OR A paper copy in the mail If you have any lab test that is abnormal or we need to change your treatment, we will call you to review the results.   Testing/Procedures: None Ordered   Follow-Up: You have been referred to Lipid Clinic - Please schedule appointment January 2023   At [facility], you and your health needs are our priority.  As part of our continuing mission to provide you with exceptional heart care, we have created designated Provider Care Teams.  These Care Teams include your primary Cardiologist (physician) and Advanced Practice Providers (APPs -  Physician Assistants and Nurse Practitioners) who all work together to provide you with the care you need, when you need it.   Your next appointment:   6 month(s)  The format for your next appointment:   In Person  Provider:   You may see Werner Lean, MD or one of the following Advanced Practice Providers on your designated Care Team:   Melina Copa, PA-C Ermalinda Barrios, PA-C    Signed, Werner Lean, MD  06/06/2021 1:50 PM    [place]

## 2021-06-12 ENCOUNTER — Other Ambulatory Visit: Payer: Self-pay | Admitting: Nurse Practitioner

## 2021-06-12 DIAGNOSIS — J301 Allergic rhinitis due to pollen: Secondary | ICD-10-CM

## 2021-07-04 ENCOUNTER — Ambulatory Visit: Payer: Self-pay | Admitting: Nurse Practitioner

## 2021-08-04 ENCOUNTER — Other Ambulatory Visit: Payer: Self-pay | Admitting: Nurse Practitioner

## 2021-08-04 DIAGNOSIS — K219 Gastro-esophageal reflux disease without esophagitis: Secondary | ICD-10-CM

## 2021-08-15 ENCOUNTER — Other Ambulatory Visit: Payer: Self-pay

## 2021-08-15 ENCOUNTER — Ambulatory Visit: Payer: Medicare PPO | Admitting: Pharmacist

## 2021-08-15 DIAGNOSIS — Z720 Tobacco use: Secondary | ICD-10-CM

## 2021-08-15 DIAGNOSIS — E782 Mixed hyperlipidemia: Secondary | ICD-10-CM

## 2021-08-15 MED ORDER — NEXLIZET 180-10 MG PO TABS: 1.0000 | ORAL_TABLET | Freq: Every day | ORAL | 11 refills | Status: DC

## 2021-08-15 NOTE — Patient Instructions (Signed)
Your LDL cholesterol is 87 and your goal is < 70  Continue taking atorvastatin (Lipitor) 40mg  twice a week  I'll submit information to your insurance to see if they'll cover Nexlizet. This would replace your ezetimibe (Zetia) and lower your LDL cholesterol an extra 20%  I'll call you when I hear back from your insurance  Your primary care doctor can recheck your labs when you see her in a few months

## 2021-08-15 NOTE — Progress Notes (Signed)
Patient ID: [name]                 DOB: 1946-03-27                    MRN: [id number]     HPI: Peggy Smith is a 76 y.o. female patient referred to lipid clinic by Dr. Gasper Sells. PMH is significant for HTN, HLD, tobacco abuse, CAD without angina, hx of hepatitis (1976), and anxiety.  Cardiac CT on 02/19/21 showed a CAC score of 144 (66th percentile for age/sex matched control) with mild non-obstructive CAD (25-49%). Dr. Gasper Sells started pt on Zetia 10 mg daily starting 03/15/21.  At previous visit on 11/2, LDL improved but remained above goal at 87, with addition of Zetia 10 mg daily to atorvastatin 40 mg 2x/week. She has been able to walk around [place] and [place] with some DOE. Pt was open to attempting tobacco cessation and planned to attempt in new year after the anniversary of the death of her husband (Thanksgiving) and sister in law (Christmas). Referred to lipid clinic for potential bempedoic acid initiation.   Today, pt reports she experienced myalgias on atorvastatin 20 mg, 40 mg, 80 mg daily and a past unknown dose of rosuvastatin. Pt does not remember taking pravastatin. She experienced joint pain in her knees on both statins that presented within a few months of statin initiation in each case. Pt reports that she is doing well on atorvastatin 40 mg 2x weekly and Zetia 10 mg daily. Pt is working with PCP to discuss smoking cessation after her emotionally-taxing holiday season. Mentions her triggers include driving and talking with her daughter on the phone.  Current Medications: atorvastain 40 mg 2x/week on Mon/Thurs (only way to take without pain with walking), Zetia 10 mg daily  Intolerances: atorvastatin 20 mg, 40 mg, and 80 mg daily (joint pain), pravastatin 40 mg daily (does not recall taking), rosuvastatin (unknown dose - joint pain)  Risk Factors: smoker, HTN, elevated CAC score   LDL goal: <70 mg/dL  Diet: Eats lots of TV dinners, PB&J for lunch, does not cook  very often, does not eat lots of fried food; gets [place] food once weekly with sons after church  Exercise: Not often, unable to walk 4 dogs  Family History: The patient's family history includes Anxiety disorder in her brother; Asthma in her son; Brain cancer in her father; Dementia in her mother. There is no history of Colon cancer or Breast cancer.  Social History: Cigarette smoker - 0.25 PPD for 30 years   Labs:  01/02/21: TC 211, TGL 129, LDL 137  04/16/21: TC 166, TGL 134, LDL 94 (atorvastatin 40mg  twice weekly and ezetimibe 10mg  daily) 06/05/21: TC 161, TGL 133, HDL 51, LDL 87 (atorvastatin 40mg  twice weekly and ezetimibe 10mg  daily)  Past Medical History:  Diagnosis Date   Angina    STRESS TEST NORMAL-ANXIETY   Anxiety    Cardiac arrhythmia due to congenital heart disease    Depression    GERD (gastroesophageal reflux disease)    Headache(784.0)    OTC MEDS   Hepatitis     1976   Hyperlipidemia    DOES NOT TAKE MEDS - HAS THEM AT HOME BUT STATES DOES NOT TAKE   Hypertension    Hypothyroidism    Iron deficiency anemia    Melanoma ([place])    Panic attacks    PONV (postoperative nausea and vomiting)     Current Outpatient Medications on File  Prior to Visit  Medication Sig Dispense Refill   amLODipine (NORVASC) 5 MG tablet TAKE 1 TABLET BY MOUTH EVERY DAY 90 tablet 1   aspirin 81 MG tablet Take 81 mg by mouth every morning.     atorvastatin (LIPITOR) 40 MG tablet Take 1 tablet (40 mg total) by mouth 2 (two) times a week. Take Mon, Thur 90 tablet 1   calcium carbonate (OS-CAL) 600 MG TABS Take 600 mg by mouth every evening.     Cholecalciferol (VITAMIN D-3) 5000 UNITS TABS Take 5,000 Units by mouth every evening.     clonazePAM (KLONOPIN) 0.5 MG tablet Take 1 tablet (0.5 mg total) by mouth 2 (two) times daily as needed for anxiety. 60 tablet 5   escitalopram (LEXAPRO) 20 MG tablet Take 20 mg by mouth at bedtime.     ezetimibe (ZETIA) 10 MG tablet Take 1 tablet (10 mg total)  by mouth daily. 90 tablet 1   fluticasone (FLONASE) 50 MCG/ACT nasal spray SPRAY 2 SPRAYS INTO EACH NOSTRIL EVERY DAY 48 mL 1   levothyroxine (SYNTHROID) 88 MCG tablet Take 1 tablet (88 mcg total) by mouth daily. 90 tablet 1   magnesium 30 MG tablet Take 1 tablet (30 mg total) by mouth daily. 90 tablet 1   Multiple Vitamins-Minerals (CENTRUM SILVER PO) Take 1 tablet by mouth every morning.     omeprazole (PRILOSEC) 40 MG capsule TAKE 1 CAPSULE (40 MG TOTAL) BY MOUTH DAILY. 90 capsule 0   Riboflavin (VITAMIN B-2 PO) Take 100 mg by mouth every morning.     No current facility-administered medications on file prior to visit.    Allergies  Allergen Reactions   Crestor [Rosuvastatin]     Muscle aches   Macrodantin Other (See Comments)    Liver shut down   Sulfa Antibiotics Other (See Comments)    agitation   Codeine Nausea And Vomiting         Assessment/Plan:  1. Hyperlipidemia - LDL elevated at 87 (06/05/21) above goal <70 due to CAD and CAC score of 144 (66th percentile for age/sex matched control). Encouraged patient to continue atorvastatin 40 mg 2x/weekly. Will change ezetimibe to Nexlizet 180-10mg  daily for additional 20% LDL lowering. She is ok with anticipated copay of $47/month but was advised to call clinic if cost becomes prohibitive in the future. Pt is not interested in PCSK9i due to concerns with adherence, daily meds are easier for her to remember to take. Encouraged patient to try to limit sodium intake and work on a heart healthy diet. F/u lipid panel in 8 weeks at PCP.  2. Tobacco use - Pt smoking ~1/4 PPD. Encouraged patient to try to cut down the amount of cigarettes she smokes and work to identify and replace triggers. Opened the conversation with the patient about the smoking cessation medicines available.   Jethro Poling, PharmD Candidate [id number] assisted in this visit.  Megan E. Supple, PharmD, BCACP, [place] [address],  [place], [place] [address] Phone: [phone]; Fax: [phone] 08/15/2021 2:17 PM

## 2021-10-15 ENCOUNTER — Encounter: Payer: Self-pay | Admitting: Nurse Practitioner

## 2021-10-15 ENCOUNTER — Ambulatory Visit: Payer: Medicare PPO | Admitting: Nurse Practitioner

## 2021-10-15 VITALS — BP 110/71 | HR 70 | Temp 98.6°F | Resp 20 | Ht 68.0 in | Wt 188.0 lb

## 2021-10-15 DIAGNOSIS — I10 Essential (primary) hypertension: Secondary | ICD-10-CM

## 2021-10-15 DIAGNOSIS — F410 Panic disorder [episodic paroxysmal anxiety] without agoraphobia: Secondary | ICD-10-CM | POA: Diagnosis not present

## 2021-10-15 DIAGNOSIS — F411 Generalized anxiety disorder: Secondary | ICD-10-CM | POA: Diagnosis not present

## 2021-10-15 DIAGNOSIS — E039 Hypothyroidism, unspecified: Secondary | ICD-10-CM

## 2021-10-15 DIAGNOSIS — E782 Mixed hyperlipidemia: Secondary | ICD-10-CM | POA: Diagnosis not present

## 2021-10-15 DIAGNOSIS — Z6828 Body mass index (BMI) 28.0-28.9, adult: Secondary | ICD-10-CM

## 2021-10-15 DIAGNOSIS — Z720 Tobacco use: Secondary | ICD-10-CM | POA: Diagnosis not present

## 2021-10-15 DIAGNOSIS — D509 Iron deficiency anemia, unspecified: Secondary | ICD-10-CM

## 2021-10-15 DIAGNOSIS — K219 Gastro-esophageal reflux disease without esophagitis: Secondary | ICD-10-CM | POA: Diagnosis not present

## 2021-10-15 DIAGNOSIS — I2510 Atherosclerotic heart disease of native coronary artery without angina pectoris: Secondary | ICD-10-CM

## 2021-10-15 MED ORDER — AMLODIPINE BESYLATE 5 MG PO TABS: ORAL_TABLET | ORAL | 1 refills | Status: DC

## 2021-10-15 MED ORDER — CLONAZEPAM 0.5 MG PO TABS: 0.5000 mg | ORAL_TABLET | Freq: Two times a day (BID) | ORAL | 5 refills | Status: DC | PRN

## 2021-10-15 MED ORDER — ATORVASTATIN CALCIUM 40 MG PO TABS: 40.0000 mg | ORAL_TABLET | ORAL | 1 refills | Status: DC

## 2021-10-15 MED ORDER — LEVOTHYROXINE SODIUM 88 MCG PO TABS: 88.0000 ug | ORAL_TABLET | Freq: Every day | ORAL | 1 refills | Status: DC

## 2021-10-15 MED ORDER — ESCITALOPRAM OXALATE 20 MG PO TABS: 20.0000 mg | ORAL_TABLET | Freq: Every day | ORAL | 1 refills | Status: DC

## 2021-10-15 MED ORDER — OMEPRAZOLE 40 MG PO CPDR: 40.0000 mg | DELAYED_RELEASE_CAPSULE | Freq: Every day | ORAL | 1 refills | Status: DC

## 2021-10-15 NOTE — Addendum Note (Signed)
Addended by: Chevis Pretty on: 10/15/2021 02:41 PM ? ? Modules accepted: Orders ? ?

## 2021-10-15 NOTE — Addendum Note (Signed)
Addended by: Chevis Pretty on: 10/15/2021 01:01 PM ? ? Modules accepted: Orders ? ?

## 2021-10-15 NOTE — Patient Instructions (Signed)

## 2021-10-15 NOTE — Progress Notes (Addendum)
Subjective:    Patient ID: Peggy Smith, female    DOB: 05-13-1946, 76 y.o.   MRN: [id number]   Chief Complaint: Medical Management of Chronic Issues (Right knee pain/)    HPI:  Peggy Smith is a 76 y.o. who identifies as a female who was assigned female at birth.   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 Primary hypertension No c/o chest pain, sob or headache. Does not check blood pressure at home. BP Readings from Last 3 Encounters:  10/15/21 110/71  06/06/21 110/60  04/16/21 120/77      2. Coronary artery disease involving native coronary artery of native heart without angina pectoris Last saw cardiology in 08/15/21. Review of note showed no change in plan of care.  3. Mixed hyperlipidemia Does try to watch diet and does no dedicated exercise. Lab Results  Component Value Date   CHOL 161 06/05/2021   HDL 51 06/05/2021   LDLCALC 87 06/05/2021   TRIG 133 06/05/2021   CHOLHDL 3.2 06/05/2021   The 10-year ASCVD risk score (Arnett DK, et al., 2019) is: 24.1%   4. Gastroesophageal reflux disease without esophagitis Is on omerazole and is doing well.  5. Acquired hypothyroidism No problems that aware of. Lab Results  Component Value Date   TSH 1.880 04/16/2021     6. Anxiety state Stays anxious. She has been this way her whole life. Is on klonopin BID GAD 7 : Generalized Anxiety Score 10/15/2021 04/16/2021 01/02/2021 06/23/2020  Nervous, Anxious, on Edge 0 0 0 3  Control/stop worrying 0 0 0 3  Worry too much - different things 0 0 0 3  Trouble relaxing 0 '1 1 1  '$ Restless 0 0 0 0  Easily annoyed or irritable 0 0 0 0  Afraid - awful might happen 0 0 0 0  Total GAD 7 Score 0 '1 1 10  '$ Anxiety Difficulty Not difficult at all Not difficult at all Not difficult at all Not difficult at all      7. Panic attacks Has occasional episodes but  is usually when her chrildren are having problems with the famil  bsiness.  8. Iron deficiency anemia, unspecified iron deficiency anemia type No c/o fatigue Lab Results  Component Value Date   HGB 12.4 04/16/2021     9. Tobacco abuse Smokes, but since lent she is down to 2 cigarettes a day.  10. BMI 29.0-29.9,adult Weight is down 8 lbs Wt Readings from Last 3 Encounters:  10/15/21 188 lb (85.3 kg)  06/06/21 196 lb (88.9 kg)  04/16/21 196 lb (88.9 kg)   BMI Readings from Last 3 Encounters:  10/15/21 28.59 kg/m  06/06/21 29.80 kg/m  04/16/21 29.80 kg/m      New complaints: None today  Allergies  Allergen Reactions   Crestor [Rosuvastatin]     Muscle aches   Macrodantin Other (See Comments)    Liver shut down   Sulfa Antibiotics Other (See Comments)    agitation   Codeine Nausea And Vomiting        Outpatient Encounter Medications as of 10/15/2021  Medication Sig   amLODipine (NORVASC) 5 MG tablet TAKE 1 TABLET BY MOUTH EVERY DAY   aspirin 81 MG tablet Take 81 mg by mouth every morning.   atorvastatin (LIPITOR) 40 MG tablet Take 1 tablet (40 mg total) by mouth 2 (two) times  a week. Take Mon, Thur   Bempedoic Acid-Ezetimibe (NEXLIZET) 180-10 MG TABS Take 1 tablet by mouth daily.   calcium carbonate (OS-CAL) 600 MG TABS Take 600 mg by mouth every evening.   Cholecalciferol (VITAMIN D-3) 5000 UNITS TABS Take 5,000 Units by mouth every evening.   clonazePAM (KLONOPIN) 0.5 MG tablet Take 1 tablet (0.5 mg total) by mouth 2 (two) times daily as needed for anxiety.   escitalopram (LEXAPRO) 20 MG tablet Take 20 mg by mouth at bedtime.   fluticasone (FLONASE) 50 MCG/ACT nasal spray SPRAY 2 SPRAYS INTO EACH NOSTRIL EVERY DAY   levothyroxine (SYNTHROID) 88 MCG tablet Take 1 tablet (88 mcg total) by mouth daily.   magnesium 30 MG tablet Take 1 tablet (30 mg total) by mouth daily.   Multiple Vitamins-Minerals (CENTRUM SILVER PO) Take 1 tablet by mouth every morning.    omeprazole (PRILOSEC) 40 MG capsule TAKE 1 CAPSULE (40 MG TOTAL) BY MOUTH DAILY.   Riboflavin (VITAMIN B-2 PO) Take 100 mg by mouth every morning.   No facility-administered encounter medications on file as of 10/15/2021.    Past Surgical History:  Procedure Laterality Date   COLONOSCOPY     CYSTOSCOPY  11/08/2011   Procedure: CYSTOSCOPY;  Surgeon: Delice Lesch, MD;  Location: [facility];  Service: Gynecology;  Laterality: N/A;   DILATION AND CURETTAGE OF UTERUS     LAPAROSCOPIC HYSTERECTOMY  11/08/2011   Procedure: HYSTERECTOMY TOTAL LAPAROSCOPIC;  Surgeon: Delice Lesch, MD;  Location: [facility];  Service: Gynecology;  Laterality: N/A;  with Bilateral Salpingo-Oophorectomies   MELANOMA EXCISION     Left Shoulder   SVD      X 4   TUBAL LIGATION     UPPER GASTROINTESTINAL ENDOSCOPY      Family History  Problem Relation Age of Onset   Dementia Mother    Brain cancer Father    Anxiety disorder Brother    Asthma Son    Colon cancer Neg Hx    Breast cancer Neg Hx       Controlled substance contract: 01/05/21 Drug screen done today     Review of Systems  Constitutional:  Negative for diaphoresis.  Eyes:  Negative for pain.  Respiratory:  Negative for shortness of breath.   Cardiovascular:  Negative for chest pain, palpitations and leg swelling.  Gastrointestinal:  Negative for abdominal pain.  Endocrine: Negative for polydipsia.  Skin:  Negative for rash.  Neurological:  Negative for dizziness, weakness and headaches.  Hematological:  Does not bruise/bleed easily.  All other systems reviewed and are negative.     Objective:   Physical Exam Vitals and nursing note reviewed.  Constitutional:      General: She is not in acute distress.    Appearance: Normal appearance. She is well-developed.  HENT:     Head: Normocephalic.     Right Ear: Tympanic membrane normal.     Left Ear: Tympanic membrane normal.     Nose: Nose normal.     Mouth/Throat:     Mouth: Mucous  membranes are moist.  Eyes:     Pupils: Pupils are equal, round, and reactive to light.  Neck:     Vascular: No carotid bruit or JVD.  Cardiovascular:     Rate and Rhythm: Normal rate and regular rhythm.     Heart sounds: Normal heart sounds.  Pulmonary:     Effort: Pulmonary effort is normal. No respiratory distress.     Breath sounds: Normal breath  sounds. No wheezing or rales.  Chest:     Chest wall: No tenderness.  Abdominal:     General: Bowel sounds are normal. There is no distension or abdominal bruit.     Palpations: Abdomen is soft. There is no hepatomegaly, splenomegaly, mass or pulsatile mass.     Tenderness: There is no abdominal tenderness.  Musculoskeletal:        General: Normal range of motion.     Cervical back: Normal range of motion and neck supple.  Lymphadenopathy:     Cervical: No cervical adenopathy.  Skin:    General: Skin is warm and dry.  Neurological:     Mental Status: She is alert and oriented to person, place, and time.     Deep Tendon Reflexes: Reflexes are normal and symmetric.  Psychiatric:        Behavior: Behavior normal.        Thought Content: Thought content normal.        Judgment: Judgment normal.    BP 110/71    Pulse 70    Temp 98.6 F (37 C) (Temporal)    Resp 20    Ht '5\' 8"'$  (1.727 m)    Wt 188 lb (85.3 kg)    SpO2 95%    BMI 28.59 kg/m        Assessment & Plan:   Peggy Smith comes in today with chief complaint of Medical Management of Chronic Issues (Right knee pain/)   Diagnosis and orders addressed:  1. Primary hypertension Low sodium diet - amLODipine (NORVASC) 5 MG tablet; TAKE 1 TABLET BY MOUTH EVERY DAY  Dispense: 90 tablet; Refill: 1  2. Coronary artery disease involving native coronary artery of native heart without angina pectoris Keep follow up with cardiology  3. Mixed hyperlipidemia Low fat diet - atorvastatin (LIPITOR) 40 MG tablet; Take 1 tablet (40 mg total) by mouth 2 (two) times a week. Take  Rolene Course  Dispense: 90 tablet; Refill: 1  4. Gastroesophageal reflux disease without esophagitis Avoid spicy foods Do not eat 2 hours prior to bedtime  - omeprazole (PRILOSEC) 40 MG capsule; Take 1 capsule (40 mg total) by mouth daily.  Dispense: 90 capsule; Refill: 1  5. Acquired hypothyroidism Labs poending - levothyroxine (SYNTHROID) 88 MCG tablet; Take 1 tablet (88 mcg total) by mouth daily.  Dispense: 90 tablet; Refill: 1  6. Anxiety state Stress management - ToxASSURE Select 13 (MW), Urine - clonazePAM (KLONOPIN) 0.5 MG tablet; Take 1 tablet (0.5 mg total) by mouth 2 (two) times daily as needed for anxiety.  Dispense: 60 tablet; Refill: 5  7. Panic attacks - escitalopram (LEXAPRO) 20 MG tablet; Take 1 tablet (20 mg total) by mouth at bedtime.  Dispense: 90 tablet; Refill: 1  8. Iron deficiency anemia, unspecified iron deficiency anemia type Labs pending  9. Tobacco abuse Smoking cessation encouraged - CT CHEST LUNG CA SCREEN LOW DOSE W/O CM; Future  10. BMI 28.0-28.9,adult Discussed diet and exercise for person with BMI >25 Will recheck weight in 3-6 months    Labs pending Health Maintenance reviewed Diet and exercise encouraged  Follow up plan: 6 months   [name] Hassell Done, FNP

## 2021-10-18 ENCOUNTER — Other Ambulatory Visit: Payer: Medicare PPO

## 2021-10-18 DIAGNOSIS — E782 Mixed hyperlipidemia: Secondary | ICD-10-CM | POA: Diagnosis not present

## 2021-10-18 DIAGNOSIS — I10 Essential (primary) hypertension: Secondary | ICD-10-CM | POA: Diagnosis not present

## 2021-10-19 LAB — LIPID PANEL
Chol/HDL Ratio: 2.1 ratio (ref 0.0–4.4)
Cholesterol, Total: 99 mg/dL — ABNORMAL LOW (ref 100–199)
HDL: 47 mg/dL (ref >39)
LDL Chol Calc (NIH): 34 mg/dL (ref 0–99)
Triglycerides: 91 mg/dL (ref 0–149)
VLDL Cholesterol Cal: 18 mg/dL (ref 5–40)

## 2021-10-19 LAB — CBC WITH DIFFERENTIAL/PLATELET
Basophils Absolute: 0.1 10*3/uL (ref 0.0–0.2)
Basos: 2 %
EOS (ABSOLUTE): 0.3 10*3/uL (ref 0.0–0.4)
Eos: 3 %
Hematocrit: 37.8 % (ref 34.0–46.6)
Hemoglobin: 12.5 g/dL (ref 11.1–15.9)
Immature Grans (Abs): 0 10*3/uL (ref 0.0–0.1)
Immature Granulocytes: 0 %
Lymphocytes Absolute: 3.1 10*3/uL (ref 0.7–3.1)
Lymphs: 40 %
MCH: 27.4 pg (ref 26.6–33.0)
MCHC: 33.1 g/dL (ref 31.5–35.7)
MCV: 83 fL (ref 79–97)
Monocytes Absolute: 0.7 10*3/uL (ref 0.1–0.9)
Monocytes: 8 %
Neutrophils Absolute: 3.5 10*3/uL (ref 1.4–7.0)
Neutrophils: 47 %
Platelets: 422 10*3/uL (ref 150–450)
RBC: 4.57 x10E6/uL (ref 3.77–5.28)
RDW: 14.6 % (ref 11.7–15.4)
WBC: 7.7 10*3/uL (ref 3.4–10.8)

## 2021-10-19 LAB — CMP14+EGFR
ALT: 17 IU/L (ref 0–32)
AST: 22 IU/L (ref 0–40)
Albumin/Globulin Ratio: 1.8 (ref 1.2–2.2)
Albumin: 4.3 g/dL (ref 3.7–4.7)
Alkaline Phosphatase: 105 IU/L (ref 44–121)
BUN/Creatinine Ratio: 15 (ref 12–28)
BUN: 15 mg/dL (ref 8–27)
Bilirubin Total: 0.3 mg/dL (ref 0.0–1.2)
CO2: 24 mmol/L (ref 20–29)
Calcium: 9 mg/dL (ref 8.7–10.3)
Chloride: 102 mmol/L (ref 96–106)
Creatinine, Ser: 0.99 mg/dL (ref 0.57–1.00)
Globulin, Total: 2.4 g/dL (ref 1.5–4.5)
Glucose: 99 mg/dL (ref 70–99)
Potassium: 4.6 mmol/L (ref 3.5–5.2)
Sodium: 141 mmol/L (ref 134–144)
Total Protein: 6.7 g/dL (ref 6.0–8.5)
eGFR: 59 mL/min/{1.73_m2} — ABNORMAL LOW (ref >59)

## 2021-10-21 LAB — TOXASSURE SELECT 13 (MW), URINE

## 2021-11-13 DIAGNOSIS — H43393 Other vitreous opacities, bilateral: Secondary | ICD-10-CM | POA: Diagnosis not present

## 2021-11-20 ENCOUNTER — Other Ambulatory Visit: Payer: Self-pay | Admitting: Nurse Practitioner

## 2021-11-20 DIAGNOSIS — J301 Allergic rhinitis due to pollen: Secondary | ICD-10-CM

## 2021-12-15 NOTE — Progress Notes (Signed)
?Cardiology Office Note:   ? ?Date:  12/17/2021  ? ?ID:  Peggy Smith, DOB 08-16-1945, MRN [id number] ? ?PCP:  Chevis Pretty, FNP ?  ?[facility] Providers ?Cardiologist:  Werner Lean, MD    ? ?Referring MD: Hassell Done, [name], *  ? ?CC: Follow up Mild CAD ? ?History of Present Illness:   ? ?Peggy Smith is a 76 y.o. female with a hx HTN, HLD with mylagias on atorvastatin 80 mg, Tobacco Abuse who presents 02/07/21. In interim of this visit, patient cardiac CT with mild non obstructive CAD.  Started Zetia 10 given 2 failed statins. 12/17/21. ? ?Patient notes that she is doing OK.   ?She is taking Nxlizet with significant improvement in Lipids: she is still paying $47 a month despite insurance ?There are no interval hospital/ED visit.   ? ?No chest pain or pressure .  No SOB and DOE has improved cutting down from 7 cigarettes to 2 cigarettes. No PND/Orthopnea.  No weight gain or leg swelling.  No palpitations or syncope.  She is able to tolerate atorvastatin 40 mg PO twice a week without symptoms. ? ?Her dog is presently dealing with end stage heart failure ? ? ? ?Past Medical History:  ?Diagnosis Date  ? Angina   ? STRESS TEST NORMAL-ANXIETY  ? Anxiety   ? Cardiac arrhythmia due to congenital heart disease   ? Depression   ? GERD (gastroesophageal reflux disease)   ? Headache(784.0)   ? OTC MEDS  ? Hepatitis    ? 1976  ? Hyperlipidemia   ? DOES NOT TAKE MEDS - HAS THEM AT HOME BUT STATES DOES NOT TAKE  ? Hypertension   ? Hypothyroidism   ? Iron deficiency anemia   ? Melanoma ([place])   ? Panic attacks   ? PONV (postoperative nausea and vomiting)   ? ? ?Past Surgical History:  ?Procedure Laterality Date  ? COLONOSCOPY    ? CYSTOSCOPY  11/08/2011  ? Procedure: CYSTOSCOPY;  Surgeon: Delice Lesch, MD;  Location: [facility];  Service: Gynecology;  Laterality: N/A;  ? DILATION AND CURETTAGE OF UTERUS    ? LAPAROSCOPIC HYSTERECTOMY  11/08/2011  ? Procedure: HYSTERECTOMY TOTAL LAPAROSCOPIC;  Surgeon:  Delice Lesch, MD;  Location: [facility];  Service: Gynecology;  Laterality: N/A;  with Bilateral Salpingo-Oophorectomies  ? MELANOMA EXCISION    ? Left Shoulder  ? SVD     ? X 4  ? TUBAL LIGATION    ? UPPER GASTROINTESTINAL ENDOSCOPY    ? ? ?Current Medications: ?Current Meds  ?Medication Sig  ? amLODipine (NORVASC) 5 MG tablet TAKE 1 TABLET BY MOUTH EVERY DAY  ? aspirin 81 MG tablet Take 81 mg by mouth every morning.  ? atorvastatin (LIPITOR) 40 MG tablet Take 1 tablet (40 mg total) by mouth 2 (two) times a week. Take Rolene Course  ? Bempedoic Acid-Ezetimibe (NEXLIZET) 180-10 MG TABS Take 1 tablet by mouth daily.  ? calcium carbonate (OS-CAL) 600 MG TABS Take 600 mg by mouth every evening.  ? Cholecalciferol (VITAMIN D-3) 5000 UNITS TABS Take 5,000 Units by mouth every evening.  ? clonazePAM (KLONOPIN) 0.5 MG tablet Take 1 tablet (0.5 mg total) by mouth 2 (two) times daily as needed for anxiety.  ? escitalopram (LEXAPRO) 20 MG tablet Take 1 tablet (20 mg total) by mouth at bedtime.  ? fluticasone (FLONASE) 50 MCG/ACT nasal spray SPRAY 2 SPRAYS INTO EACH NOSTRIL EVERY DAY  ? levothyroxine (SYNTHROID) 88 MCG tablet Take 1  tablet (88 mcg total) by mouth daily.  ? magnesium 30 MG tablet Take 1 tablet (30 mg total) by mouth daily.  ? Multiple Vitamins-Minerals (CENTRUM SILVER PO) Take 1 tablet by mouth every morning.  ? omeprazole (PRILOSEC) 40 MG capsule Take 1 capsule (40 mg total) by mouth daily.  ? Riboflavin (VITAMIN B-2 PO) Take 100 mg by mouth every morning.  ?  ? ?Allergies:   Crestor [rosuvastatin], Macrodantin, Sulfa antibiotics, and Codeine  ? ?Social History  ? ?Socioeconomic History  ? Marital status: Widowed  ?  Spouse name: [name]  ? Number of children: 4  ? Years of education: Not on file  ? Highest education level: Master's degree (e.g., MA, MS, MEng, MEd, MSW, MBA)  ?Occupational History  ? Occupation: paralegal  ?  Employer: [employer]  ?Tobacco Use  ? Smoking status: Every Day  ?  Packs/day:  0.25  ?  Years: 30.00  ?  Pack years: 7.50  ?  Types: Cigarettes  ? Smokeless tobacco: Never  ?Substance and Sexual Activity  ? Alcohol use: No  ? Drug use: No  ? Sexual activity: Yes  ?  Birth control/protection: Surgical  ?Other Topics Concern  ? Not on file  ?Social History Narrative  ? Lives alone, but one son stays with her a lot - Daughter lives in [place], other children live nearby  ? ?Social Determinants of Health  ? ?Financial Resource Strain: Low Risk   ? Difficulty of Paying Living Expenses: Not hard at all  ?Food Insecurity: No Food Insecurity  ? Worried About Charity fundraiser in the Last Year: Never true  ? Ran Out of Food in the Last Year: Never true  ?Transportation Needs: No Transportation Needs  ? Lack of Transportation (Medical): No  ? Lack of Transportation (Non-Medical): No  ?Physical Activity: Inactive  ? Days of Exercise per Week: 0 days  ? Minutes of Exercise per Session: 0 min  ?Stress: No Stress Concern Present  ? Feeling of Stress : Not at all  ?Social Connections: Moderately Integrated  ? Frequency of Communication with Friends and Family: More than three times a week  ? Frequency of Social Gatherings with Friends and Family: More than three times a week  ? Attends Religious Services: More than 4 times per year  ? Active Member of Clubs or Organizations: Yes  ? Attends Archivist Meetings: More than 4 times per year  ? Marital Status: Widowed  ?  ?Social: Lost her husband Thanksgiving 2021; family makes [employer], [employer] to trip with her daughter in [place]  ? ?Family History: ?The patient's family history includes Anxiety disorder in her brother; Asthma in her son; Brain cancer in her father; Dementia in her mother. There is no history of Colon cancer or Breast cancer. ? ?ROS:   ?Please see the history of present illness.    ? All other systems reviewed and are negative. ? ?EKGs/Labs/Other Studies Reviewed:   ? ?The following studies were reviewed today: ? ?EKG:  EKG is  ordered  today.  The ekg ordered today demonstrates  ?02/07/21: SR 85 borderline ant inf ? ?Echo Stress Testing : ?Date:01/08/11 ?Results: ?Negative Stress Echo for ischemia ? ?Cardiac CT: ?Date: 02/19/21 ?Results: ?IMPRESSION: ?1. Coronary calcium score of 144. This was 66th percentile for age ?and sex matched control. ?  ?2. Normal coronary origin with right dominance. ?  ?3. Nonobstructive CAD ?  ?4. Mixed plaque in proximal LAD causes mild (25-49%) stenosis.  High ?risk plaque features including positive remodeling, spotty ?calcification, and low attenuation plaque ?  ?5. Calcified plaque causes minimal (0-24%) stenosis in the proximal ?RCA, mid LAD, and proximal LCX ?  ?6. Mild dilatation of ascending aorta measuring 93m ?  ?CAD-RADS 2. Mild non-obstructive CAD (25-49%). Consider ?non-atherosclerotic causes of chest pain. Consider preventive ?therapy and risk factor modification. ?  ? ?Recent Labs: ?04/16/2021: TSH 1.880 ?10/18/2021: ALT 17; BUN 15; Creatinine, Ser 0.99; Hemoglobin 12.5; Platelets 422; Potassium 4.6; Sodium 141  ?Recent Lipid Panel ?   ?Component Value Date/Time  ? CHOL 99 (L) 10/18/2021 0851  ? CHOL 255 (H) 01/19/2013 1620  ? TRIG 91 10/18/2021 0851  ? TRIG 264 (H) 11/09/2013 1716  ? TRIG 296 (H) 01/19/2013 1620  ? HDL 47 10/18/2021 0851  ? HDL 46 11/09/2013 1716  ? HDL 43 01/19/2013 1620  ? CHOLHDL 2.1 10/18/2021 0851  ? LVenango34 10/18/2021 0851  ? LDLCALC 158 (H) 11/09/2013 1716  ? LDLCALC 153 (H) 01/19/2013 1620  ?    ? ?Physical Exam:   ? ?VS:  BP (!) 110/58   Pulse 89   Ht '5\' 8"'$  (1.727 m)   Wt 188 lb (85.3 kg)   SpO2 95%   BMI 28.59 kg/m?    ? ?Wt Readings from Last 3 Encounters:  ?12/17/21 188 lb (85.3 kg)  ?10/15/21 188 lb (85.3 kg)  ?06/06/21 196 lb (88.9 kg)  ?  ?GEN:  Well nourished, well developed in no acute distress ?HEENT: Normal ?NECK: No JVD  ?LYMPHATICS: No lymphadenopathy ?CARDIAC: RRR, no murmurs, rubs, gallops ?RESPIRATORY:  Clear to auscultation without rales, wheezing or rhonchi   ?ABDOMEN: Soft, non-tender, non-distended ?MUSCULOSKELETAL:  No edema; No deformity  ?SKIN: Warm and dry ?NEUROLOGIC:  Alert and oriented x 3 ?PSYCHIATRIC:  Depressed mood  has persistent ? ?ASSESSMENT:   ? ?1.

## 2021-12-17 ENCOUNTER — Ambulatory Visit: Payer: Medicare PPO | Admitting: Internal Medicine

## 2021-12-17 ENCOUNTER — Encounter: Payer: Self-pay | Admitting: Internal Medicine

## 2021-12-17 VITALS — BP 110/58 | HR 89 | Ht 68.0 in | Wt 188.0 lb

## 2021-12-17 DIAGNOSIS — Z720 Tobacco use: Secondary | ICD-10-CM

## 2021-12-17 DIAGNOSIS — I10 Essential (primary) hypertension: Secondary | ICD-10-CM | POA: Diagnosis not present

## 2021-12-17 DIAGNOSIS — E782 Mixed hyperlipidemia: Secondary | ICD-10-CM | POA: Diagnosis not present

## 2021-12-17 DIAGNOSIS — I2510 Atherosclerotic heart disease of native coronary artery without angina pectoris: Secondary | ICD-10-CM | POA: Diagnosis not present

## 2021-12-17 NOTE — Patient Instructions (Signed)
Medication Instructions:  ?Your physician recommends that you continue on your current medications as directed. Please refer to the Current Medication list given to you today. ? ?*If you need a refill on your cardiac medications before your next appointment, please call your pharmacy* ? ? ?Lab Work: ?TODAY: Uric Acid, LFT ?If you have labs (blood work) drawn today and your tests are completely normal, you will receive your results only by: ?MyChart Message (if you have MyChart) OR ?A paper copy in the mail ?If you have any lab test that is abnormal or we need to change your treatment, we will call you to review the results. ? ? ?Testing/Procedures: ?NONE ? ? ?Follow-Up: ?At [facility], you and your health needs are our priority.  As part of our continuing mission to provide you with exceptional heart care, we have created designated Provider Care Teams.  These Care Teams include your primary Cardiologist (physician) and Advanced Practice Providers (APPs -  Physician Assistants and Nurse Practitioners) who all work together to provide you with the care you need, when you need it. ? ?We recommend signing up for the patient portal called "MyChart".  Sign up information is provided on this After Visit Summary.  MyChart is used to connect with patients for Virtual Visits (Telemedicine).  Patients are able to view lab/test results, encounter notes, upcoming appointments, etc.  Non-urgent messages can be sent to your provider as well.   ?To learn more about what you can do with MyChart, go to [email or web address].   ? ?Your next appointment:   ?1 year(s) ? ?The format for your next appointment:   ?In Person ? ?Provider:   ?Werner Lean, MD   ? ? ?Important Information About Sugar ? ? ? ? ?  ?

## 2021-12-18 LAB — URIC ACID: Uric Acid: 6.2 mg/dL (ref 3.1–7.9)

## 2021-12-18 LAB — HEPATIC FUNCTION PANEL
ALT: 12 IU/L (ref 0–32)
AST: 22 IU/L (ref 0–40)
Albumin: 4.5 g/dL (ref 3.7–4.7)
Alkaline Phosphatase: 99 IU/L (ref 44–121)
Bilirubin Total: 0.3 mg/dL (ref 0.0–1.2)
Bilirubin, Direct: 0.13 mg/dL (ref 0.00–0.40)
Total Protein: 7.1 g/dL (ref 6.0–8.5)

## 2021-12-19 ENCOUNTER — Encounter (HOSPITAL_COMMUNITY): Payer: Self-pay

## 2021-12-19 NOTE — Progress Notes (Signed)
[facility] Referral received. Unable to reach the patient directly. No VM box available. ?

## 2022-01-11 ENCOUNTER — Ambulatory Visit (INDEPENDENT_AMBULATORY_CARE_PROVIDER_SITE_OTHER): Payer: Medicare PPO

## 2022-01-11 VITALS — Wt 188.0 lb

## 2022-01-11 DIAGNOSIS — Z0000 Encounter for general adult medical examination without abnormal findings: Secondary | ICD-10-CM

## 2022-01-11 DIAGNOSIS — Z1211 Encounter for screening for malignant neoplasm of colon: Secondary | ICD-10-CM

## 2022-01-11 NOTE — Patient Instructions (Signed)
Peggy Smith , Thank you for taking time to come for your Medicare Wellness Visit. I appreciate your ongoing commitment to your health goals. Please review the following plan we discussed and let me know if I can assist you in the future.   Screening recommendations/referrals: Colonoscopy: Done 03/29/2011 - Repeat in 10 years - ordered Cologuard today - if this is negative, you don't have to repeat Mammogram: Done 03/12/2021 - Repeat annually Bone Density: Done 01/30/2021 - Repeat every 2 years Recommended yearly ophthalmology/optometry visit for glaucoma screening and checkup Recommended yearly dental visit for hygiene and checkup  Vaccinations: Influenza vaccine: Declined - recommended every fall Pneumococcal vaccine: Declined -recommend once per lifetime Prevnar-20 Tdap vaccine: Done 11/09/2013 - Repeat in 10 years Shingles vaccine: Declined - Shingrix is 2 doses 2-6 months apart and over 90% effective     Covid-19: Done 09/09/2019, 10/11/2019, & 07/19/2020  Advanced directives: Please bring a copy of your health care power of attorney and living will to the office to be added to your chart at your convenience.   Conditions/risks identified: Aim for 30 minutes of exercise or brisk walking, 6-8 glasses of water, and 5 servings of fruits and vegetables each day.   Next appointment: Follow up in one year for your annual wellness visit    Preventive Care 65 Years and Older, Female Preventive care refers to lifestyle choices and visits with your health care provider that can promote health and wellness. What does preventive care include? A yearly physical exam. This is also called an annual well check. Dental exams once or twice a year. Routine eye exams. Ask your health care provider how often you should have your eyes checked. Personal lifestyle choices, including: Daily care of your teeth and gums. Regular physical activity. Eating a healthy diet. Avoiding tobacco and drug use. Limiting  alcohol use. Practicing safe sex. Taking low-dose aspirin every day. Taking vitamin and mineral supplements as recommended by your health care provider. What happens during an annual well check? The services and screenings done by your health care provider during your annual well check will depend on your age, overall health, lifestyle risk factors, and family history of disease. Counseling  Your health care provider may ask you questions about your: Alcohol use. Tobacco use. Drug use. Emotional well-being. Home and relationship well-being. Sexual activity. Eating habits. History of falls. Memory and ability to understand (cognition). Work and work Statistician. Reproductive health. Screening  You may have the following tests or measurements: Height, weight, and BMI. Blood pressure. Lipid and cholesterol levels. These may be checked every 5 years, or more frequently if you are over 69 years old. Skin check. Lung cancer screening. You may have this screening every year starting at age 72 if you have a 30-pack-year history of smoking and currently smoke or have quit within the past 15 years. Fecal occult blood test (FOBT) of the stool. You may have this test every year starting at age 74. Flexible sigmoidoscopy or colonoscopy. You may have a sigmoidoscopy every 5 years or a colonoscopy every 10 years starting at age 21. Hepatitis C blood test. Hepatitis B blood test. Sexually transmitted disease (STD) testing. Diabetes screening. This is done by checking your blood sugar (glucose) after you have not eaten for a while (fasting). You may have this done every 1-3 years. Bone density scan. This is done to screen for osteoporosis. You may have this done starting at age 11. Mammogram. This may be done every 1-2 years. Talk to  your health care provider about how often you should have regular mammograms. Talk with your health care provider about your test results, treatment options, and if  necessary, the need for more tests. Vaccines  Your health care provider may recommend certain vaccines, such as: Influenza vaccine. This is recommended every year. Tetanus, diphtheria, and acellular pertussis (Tdap, Td) vaccine. You may need a Td booster every 10 years. Zoster vaccine. You may need this after age 74. Pneumococcal 13-valent conjugate (PCV13) vaccine. One dose is recommended after age 69. Pneumococcal polysaccharide (PPSV23) vaccine. One dose is recommended after age 77. Talk to your health care provider about which screenings and vaccines you need and how often you need them. This information is not intended to replace advice given to you by your health care provider. Make sure you discuss any questions you have with your health care provider. Document Released: 08/18/2015 Document Revised: 04/10/2016 Document Reviewed: 05/23/2015 Elsevier Interactive Patient Education  2017 [place] Prevention in the Home Falls can cause injuries. They can happen to people of all ages. There are many things you can do to make your home safe and to help prevent falls. What can I do on the outside of my home? Regularly fix the edges of walkways and driveways and fix any cracks. Remove anything that might make you trip as you walk through a door, such as a raised step or threshold. Trim any bushes or trees on the path to your home. Use bright outdoor lighting. Clear any walking paths of anything that might make someone trip, such as rocks or tools. Regularly check to see if handrails are loose or broken. Make sure that both sides of any steps have handrails. Any raised decks and porches should have guardrails on the edges. Have any leaves, snow, or ice cleared regularly. Use sand or salt on walking paths during winter. Clean up any spills in your garage right away. This includes oil or grease spills. What can I do in the bathroom? Use night lights. Install grab bars by the toilet  and in the tub and shower. Do not use towel bars as grab bars. Use non-skid mats or decals in the tub or shower. If you need to sit down in the shower, use a plastic, non-slip stool. Keep the floor dry. Clean up any water that spills on the floor as soon as it happens. Remove soap buildup in the tub or shower regularly. Attach bath mats securely with double-sided non-slip rug tape. Do not have throw rugs and other things on the floor that can make you trip. What can I do in the bedroom? Use night lights. Make sure that you have a light by your bed that is easy to reach. Do not use any sheets or blankets that are too big for your bed. They should not hang down onto the floor. Have a firm chair that has side arms. You can use this for support while you get dressed. Do not have throw rugs and other things on the floor that can make you trip. What can I do in the kitchen? Clean up any spills right away. Avoid walking on wet floors. Keep items that you use a lot in easy-to-reach places. If you need to reach something above you, use a strong step stool that has a grab bar. Keep electrical cords out of the way. Do not use floor polish or wax that makes floors slippery. If you must use wax, use non-skid floor wax. Do not  have throw rugs and other things on the floor that can make you trip. What can I do with my stairs? Do not leave any items on the stairs. Make sure that there are handrails on both sides of the stairs and use them. Fix handrails that are broken or loose. Make sure that handrails are as long as the stairways. Check any carpeting to make sure that it is firmly attached to the stairs. Fix any carpet that is loose or worn. Avoid having throw rugs at the top or bottom of the stairs. If you do have throw rugs, attach them to the floor with carpet tape. Make sure that you have a light switch at the top of the stairs and the bottom of the stairs. If you do not have them, ask someone to add  them for you. What else can I do to help prevent falls? Wear shoes that: Do not have high heels. Have rubber bottoms. Are comfortable and fit you well. Are closed at the toe. Do not wear sandals. If you use a stepladder: Make sure that it is fully opened. Do not climb a closed stepladder. Make sure that both sides of the stepladder are locked into place. Ask someone to hold it for you, if possible. Clearly mark and make sure that you can see: Any grab bars or handrails. First and last steps. Where the edge of each step is. Use tools that help you move around (mobility aids) if they are needed. These include: Canes. Walkers. Scooters. Crutches. Turn on the lights when you go into a dark area. Replace any light bulbs as soon as they burn out. Set up your furniture so you have a clear path. Avoid moving your furniture around. If any of your floors are uneven, fix them. If there are any pets around you, be aware of where they are. Review your medicines with your doctor. Some medicines can make you feel dizzy. This can increase your chance of falling. Ask your doctor what other things that you can do to help prevent falls. This information is not intended to replace advice given to you by your health care provider. Make sure you discuss any questions you have with your health care provider. Document Released: 05/18/2009 Document Revised: 12/28/2015 Document Reviewed: 08/26/2014 Elsevier Interactive Patient Education  2017 [employer].

## 2022-01-11 NOTE — Progress Notes (Signed)
Subjective:   Peggy Smith is a 76 y.o. female who presents for Medicare Annual (Subsequent) preventive examination.  Virtual Visit via Telephone Note  I connected with  Peggy Smith on 01/11/22 at  2:45 PM EDT by telephone and verified that I am speaking with the correct person using two identifiers.  Location: Patient: Home Provider: [facility] Persons participating in the virtual visit: patient/Nurse Health Advisor   I discussed the limitations, risks, security and privacy concerns of performing an evaluation and management service by telephone and the availability of in person appointments. The patient expressed understanding and agreed to proceed.  Interactive audio and video telecommunications were attempted between this nurse and patient, however failed, due to patient having technical difficulties OR patient did not have access to video capability.  We continued and completed visit with audio only.  Some vital signs may be absent or patient reported.   Peggy Smith E Quantay Zaremba, LPN   Review of Systems     Cardiac Risk Factors include: advanced age (>74mn, >>76women);sedentary lifestyle;dyslipidemia;hypertension;smoking/ tobacco exposure;Other (see comment), Risk factor comments: CAD     Objective:    Today's Vitals   01/11/22 1445  Weight: 188 lb (85.3 kg)   Body mass index is 28.59 kg/m.     01/11/2022    2:59 PM 01/10/2021    1:09 PM 01/10/2020    1:47 PM 06/29/2018    3:53 PM 06/13/2015    6:26 PM 10/31/2011   11:05 AM 05/17/2011   11:55 AM  Advanced Directives  Does Patient Have a Medical Advance Directive? Yes Yes No No No Patient does not have advance directive Patient does not have advance directive  Type of Advance [place]ive [place]      Does patient want to make changes to medical advance directive?   No - Patient declined      Copy of HFreeportin Chart? No - copy requested No -  copy requested       Would patient like information on creating a medical advance directive?   No - Patient declined No - Patient declined       Current Medications (verified) Outpatient Encounter Medications as of 01/11/2022  Medication Sig   amLODipine (NORVASC) 5 MG tablet TAKE 1 TABLET BY MOUTH EVERY DAY   aspirin 81 MG tablet Take 81 mg by mouth every morning.   atorvastatin (LIPITOR) 40 MG tablet Take 1 tablet (40 mg total) by mouth 2 (two) times a week. Take Mon, Thur   Bempedoic Acid-Ezetimibe (NEXLIZET) 180-10 MG TABS Take 1 tablet by mouth daily.   calcium carbonate (OS-CAL) 600 MG TABS Take 600 mg by mouth every evening.   Cholecalciferol (VITAMIN D-3) 5000 UNITS TABS Take 5,000 Units by mouth every evening.   clonazePAM (KLONOPIN) 0.5 MG tablet Take 1 tablet (0.5 mg total) by mouth 2 (two) times daily as needed for anxiety.   escitalopram (LEXAPRO) 20 MG tablet Take 1 tablet (20 mg total) by mouth at bedtime.   fluticasone (FLONASE) 50 MCG/ACT nasal spray SPRAY 2 SPRAYS INTO EACH NOSTRIL EVERY DAY   levothyroxine (SYNTHROID) 88 MCG tablet Take 1 tablet (88 mcg total) by mouth daily.   magnesium 30 MG tablet Take 1 tablet (30 mg total) by mouth daily.   Multiple Vitamins-Minerals (CENTRUM SILVER PO) Take 1 tablet by mouth every morning.   omeprazole (PRILOSEC) 40 MG capsule Take 1 capsule (40 mg total) by mouth  daily.   Riboflavin (VITAMIN B-2 PO) Take 100 mg by mouth every morning.   No facility-administered encounter medications on file as of 01/11/2022.    Allergies (verified) Crestor [rosuvastatin], Macrodantin, Sulfa antibiotics, and Codeine   History: Past Medical History:  Diagnosis Date   Angina    STRESS TEST NORMAL-ANXIETY   Anxiety    Cardiac arrhythmia due to congenital heart disease    Depression    GERD (gastroesophageal reflux disease)    Headache(784.0)    OTC MEDS   Hepatitis     1976   Hyperlipidemia    DOES NOT TAKE MEDS - HAS THEM AT HOME BUT  STATES DOES NOT TAKE   Hypertension    Hypothyroidism    Iron deficiency anemia    Melanoma ([place])    Panic attacks    PONV (postoperative nausea and vomiting)    Past Surgical History:  Procedure Laterality Date   COLONOSCOPY     CYSTOSCOPY  11/08/2011   Procedure: CYSTOSCOPY;  Surgeon: Delice Lesch, MD;  Location: [facility];  Service: Gynecology;  Laterality: N/A;   DILATION AND CURETTAGE OF UTERUS     LAPAROSCOPIC HYSTERECTOMY  11/08/2011   Procedure: HYSTERECTOMY TOTAL LAPAROSCOPIC;  Surgeon: Delice Lesch, MD;  Location: [facility];  Service: Gynecology;  Laterality: N/A;  with Bilateral Salpingo-Oophorectomies   MELANOMA EXCISION     Left Shoulder   SVD      X 4   TUBAL LIGATION     UPPER GASTROINTESTINAL ENDOSCOPY     Family History  Problem Relation Age of Onset   Dementia Mother    Brain cancer Father    Anxiety disorder Brother    Asthma Son    Colon cancer Neg Hx    Breast cancer Neg Hx    Social History   Socioeconomic History   Marital status: Widowed    Spouse name: Peggy Smith   Number of children: 4   Years of education: Not on file   Highest education level: Master's degree (e.g., MA, MS, MEng, MEd, MSW, MBA)  Occupational History   Occupation: paralegal    Employer: [employer]    Comment: quit 2021 - thinking of starting back part time  Tobacco Use   Smoking status: Every Day    Packs/day: 0.25    Years: 30.00    Total pack years: 7.50    Types: Cigarettes   Smokeless tobacco: Never  Substance and Sexual Activity   Alcohol use: No   Drug use: No   Sexual activity: Yes    Birth control/protection: Surgical  Other Topics Concern   Not on file  Social History Narrative   Lives alone, but one son stays with her a lot - Daughter lives in [place], other children live nearby   Social Determinants of Health   Financial Resource Strain: [place]  (01/11/2022)   Overall Financial Resource Strain (CARDIA)    Difficulty of Paying Living Expenses: Not  hard at all  Food Insecurity: No Food Insecurity (01/11/2022)   Hunger Vital Sign    Worried About Running Out of Food in the Last Year: Never true    [place] in the Last Year: Never true  Transportation Needs: No Transportation Needs (01/11/2022)   PRAPARE - Hydrologist (Medical): No    Lack of Transportation (Non-Medical): No  Physical Activity: Insufficiently Active (01/11/2022)   Exercise Vital Sign    Days of Exercise per Week:  7 days    Minutes of Exercise per Session: 10 min  Stress: No Stress Concern Present (01/11/2022)   [place]    Feeling of Stress : Not at all  Social Connections: Moderately Integrated (01/11/2022)   Social Connection and Isolation Panel [NHANES]    Frequency of Communication with Friends and Family: More than three times a week    Frequency of Social Gatherings with Friends and Family: More than three times a week    Attends Religious Services: More than 4 times per year    Active Member of [employer] or Organizations: Yes    Attends Archivist Meetings: More than 4 times per year    Marital Status: Widowed    Tobacco Counseling Ready to quit: Yes Counseling given: Yes   Clinical Intake:  Pre-visit preparation completed: Yes  Pain : No/denies pain     BMI - recorded: 28.59 Nutritional Status: BMI 25 -29 Overweight Nutritional Risks: None Diabetes: No  How often do you need to have someone help you when you read instructions, pamphlets, or other written materials from your doctor or pharmacy?: 1 - Never  Diabetic?no  Interpreter Needed?: No  Information entered by :: Jaleiyah Alas, LPN   Activities of Daily Living    01/11/2022    2:56 PM  In your present state of health, do you have any difficulty performing the following activities:  Hearing? 0  Vision? 0  Difficulty concentrating or making decisions? 0  Walking or climbing  stairs? 0  Dressing or bathing? 0  Doing errands, shopping? 0  Preparing Food and eating ? N  Using the Toilet? N  In the past six months, have you accidently leaked urine? Y  Comment mild - just if sneezing  Do you have problems with loss of bowel control? Y  Comment rarely - only if she eats something that upsets her stomach and she doesn't make it home quick enough  Managing your Medications? N  Managing your Finances? N  Housekeeping or managing your Housekeeping? N    Patient Care Team: Chevis Pretty, FNP as PCP - General (Family Medicine) Werner Lean, MD as PCP - Cardiology (Cardiology)  Indicate any recent Medical Services you may have received from other than [facility] providers in the past year (date may be approximate).     Assessment:   This is a routine wellness examination for [place].  Hearing/Vision screen Hearing Screening - Comments:: Denies hearing difficulties   Vision Screening - Comments:: Wears rx glasses - up to date with routine eye exams with [facility] in [place] issues and exercise activities discussed: Current Exercise Habits: Home exercise routine, Type of exercise: walking, Time (Minutes): 10, Frequency (Times/Week): 7, Weekly Exercise (Minutes/Week): 70, Intensity: Mild, Exercise limited by: None identified   Goals Addressed             This Visit's Progress    DIET - EAT MORE FRUITS AND VEGETABLES   On track    Increase fruit and vegetables intake to 3-5 servings per day.      Exercise 3x per week (30 min per time)   Not on track    She hopes to get the motivation to walk daily, get energy levels up and get out of her depression Update: 2023 - depression much better - still not motivated to start walking/ exercising       Depression Screen  01/11/2022    2:53 PM 10/15/2021   12:18 PM 04/16/2021   12:24 PM 01/10/2021    1:08 PM 01/02/2021   12:04 PM 06/23/2020   10:33 AM 01/10/2020     1:49 PM  PHQ 2/9 Scores  PHQ - 2 Score 2 0 0 3 1 0 0  PHQ- 9 Score '5 3 1 8 5      '$ Fall Risk    01/11/2022    2:46 PM 10/15/2021   12:17 PM 04/16/2021   12:24 PM 01/10/2021    1:21 PM 01/02/2021   12:04 PM  Fall Risk   Falls in the past year? 1 1 0 0 0  Number falls in past yr: 0 0  0   Injury with Fall? 0 0  0   Risk for fall due to : History of fall(s) History of fall(s)  Impaired vision   Follow up Falls prevention discussed Education provided  Falls prevention discussed;Education provided     FALL RISK PREVENTION PERTAINING TO THE HOME:  Any stairs in or around the home? Yes  If so, are there any without handrails? Yes  Home free of loose throw rugs in walkways, pet beds, electrical cords, etc? Yes  Adequate lighting in your home to reduce risk of falls? Yes   ASSISTIVE DEVICES UTILIZED TO PREVENT FALLS:  Life alert? No  Use of a cane, walker or w/c? No  Grab bars in the bathroom? No  Shower chair or bench in shower? No  Elevated toilet seat or a handicapped toilet? No   TIMED UP AND GO:  Was the test performed? No . Telephonic visit  Cognitive Function:    06/29/2018    4:07 PM  MMSE - Mini Mental State Exam  Orientation to time 5  Orientation to Place 5  Registration 3  Attention/ Calculation 5  Recall 3  Language- name 2 objects 2  Language- repeat 1  Language- follow 3 step command 3  Language- read & follow direction 1  Write a sentence 1  Copy design 1  Total score 30        01/11/2022    2:58 PM 01/10/2020    1:49 PM  6CIT Screen  What Year? 0 points 0 points  What month? 0 points 0 points  What time? 0 points 0 points  Count back from 20 0 points 0 points  Months in reverse 0 points 0 points  Repeat phrase 0 points 0 points  Total Score 0 points 0 points    Immunizations Immunization History  Administered Date(s) Administered   Influenza-Unspecified 07/16/2011, 06/15/2012   Moderna SARS-COV2 Booster Vaccination 07/19/2020   Moderna  Sars-Covid-2 Vaccination 09/09/2019, 10/11/2019, 07/19/2020   Tdap 11/09/2013    TDAP status: Up to date  Flu Vaccine status: Declined, Education has been provided regarding the importance of this vaccine but patient still declined. Advised may receive this vaccine at local pharmacy or Health Dept. Aware to provide a copy of the vaccination record if obtained from local pharmacy or Health Dept. Verbalized acceptance and understanding.  Pneumococcal vaccine status: Declined,  Education has been provided regarding the importance of this vaccine but patient still declined. Advised may receive this vaccine at local pharmacy or Health Dept. Aware to provide a copy of the vaccination record if obtained from local pharmacy or Health Dept. Verbalized acceptance and understanding.   Covid-19 vaccine status: Completed vaccines  Qualifies for Shingles Vaccine? Yes   Zostavax completed No   Shingrix Completed?:  No.    Education has been provided regarding the importance of this vaccine. Patient has been advised to call insurance company to determine out of pocket expense if they have not yet received this vaccine. Advised may also receive vaccine at local pharmacy or Health Dept. Verbalized acceptance and understanding.  Screening Tests Health Maintenance  Topic Date Due   COVID-19 Vaccine (4 - Booster for Moderna series) 09/13/2020   Zoster Vaccines- Shingrix (1 of 2) 01/15/2022 (Originally 05/05/1965)   Pneumonia Vaccine 9+ Years old (1 - PCV) 10/16/2022 (Originally 05/06/1952)   INFLUENZA VACCINE  03/05/2022   MAMMOGRAM  03/12/2022   DEXA SCAN  01/31/2023   TETANUS/TDAP  11/10/2023   Hepatitis C Screening  Completed   HPV VACCINES  Aged Out   COLONOSCOPY (Pts 45-102yr Insurance coverage will need to be confirmed)  Discontinued    Health Maintenance  Health Maintenance Due  Topic Date Due   COVID-19 Vaccine (4 - Booster for Moderna series) 09/13/2020    Colorectal cancer screening: Type of  screening: Colonoscopy. Completed 03/29/2011. Repeat every 10 years - patient declines - instead, I ordered Cologuard today  Mammogram status: Completed 03/12/2021. Repeat every year  Bone Density status: Completed 6/28/202. Results reflect: Bone density results: OSTEOPENIA. Repeat every 2 years.  Lung Cancer Screening: (Low Dose CT Chest recommended if Age 77-80years, 30 pack-year currently smoking OR have quit w/in 15years.) does not qualify.   Additional Screening:  Hepatitis C Screening: does qualify; Completed 11/29/2016  Vision Screening: Recommended annual ophthalmology exams for early detection of glaucoma and other disorders of the eye. Is the patient up to date with their annual eye exam?  Yes  Who is the provider or what is the name of the office in which the patient attends annual eye exams? [facility] pt is not established with a provider, would they like to be referred to a provider to establish care? No .   Dental Screening: Recommended annual dental exams for proper oral hygiene  Community Resource Referral / Chronic Care Management: CRR required this visit?  No   CCM required this visit?  No      Plan:     I have personally reviewed and noted the following in the patient's chart:   Medical and social history Use of alcohol, tobacco or illicit drugs  Current medications and supplements including opioid prescriptions.  Functional ability and status Nutritional status Physical activity Advanced directives List of other physicians Hospitalizations, surgeries, and ER visits in previous 12 months Vitals Screenings to include cognitive, depression, and falls Referrals and appointments  In addition, I have reviewed and discussed with patient certain preventive protocols, quality metrics, and best practice recommendations. A written personalized care plan for preventive services as well as general preventive health recommendations were  provided to patient.     ASandrea Hammond LPN   60/10/5007  Nurse Notes: None

## 2022-02-06 DIAGNOSIS — Z1211 Encounter for screening for malignant neoplasm of colon: Secondary | ICD-10-CM | POA: Diagnosis not present

## 2022-02-11 LAB — COLOGUARD: COLOGUARD: POSITIVE — AB

## 2022-02-12 ENCOUNTER — Other Ambulatory Visit: Payer: Self-pay | Admitting: Nurse Practitioner

## 2022-02-12 DIAGNOSIS — R195 Other fecal abnormalities: Secondary | ICD-10-CM

## 2022-02-12 NOTE — Progress Notes (Signed)
Ef gi

## 2022-02-21 ENCOUNTER — Other Ambulatory Visit: Payer: Self-pay | Admitting: Nurse Practitioner

## 2022-02-21 DIAGNOSIS — F410 Panic disorder [episodic paroxysmal anxiety] without agoraphobia: Secondary | ICD-10-CM

## 2022-04-22 ENCOUNTER — Encounter: Payer: Self-pay | Admitting: Nurse Practitioner

## 2022-04-22 ENCOUNTER — Ambulatory Visit: Payer: Medicare PPO | Admitting: Nurse Practitioner

## 2022-04-22 VITALS — BP 113/76 | HR 88 | Temp 97.9°F | Resp 20 | Ht 68.0 in | Wt 189.0 lb

## 2022-04-22 DIAGNOSIS — I10 Essential (primary) hypertension: Secondary | ICD-10-CM | POA: Diagnosis not present

## 2022-04-22 DIAGNOSIS — E039 Hypothyroidism, unspecified: Secondary | ICD-10-CM

## 2022-04-22 DIAGNOSIS — Z23 Encounter for immunization: Secondary | ICD-10-CM

## 2022-04-22 DIAGNOSIS — E782 Mixed hyperlipidemia: Secondary | ICD-10-CM | POA: Diagnosis not present

## 2022-04-22 DIAGNOSIS — I2510 Atherosclerotic heart disease of native coronary artery without angina pectoris: Secondary | ICD-10-CM | POA: Diagnosis not present

## 2022-04-22 DIAGNOSIS — R6889 Other general symptoms and signs: Secondary | ICD-10-CM | POA: Diagnosis not present

## 2022-04-22 DIAGNOSIS — K219 Gastro-esophageal reflux disease without esophagitis: Secondary | ICD-10-CM

## 2022-04-22 DIAGNOSIS — F410 Panic disorder [episodic paroxysmal anxiety] without agoraphobia: Secondary | ICD-10-CM

## 2022-04-22 DIAGNOSIS — F411 Generalized anxiety disorder: Secondary | ICD-10-CM | POA: Diagnosis not present

## 2022-04-22 DIAGNOSIS — Z720 Tobacco use: Secondary | ICD-10-CM

## 2022-04-22 DIAGNOSIS — D509 Iron deficiency anemia, unspecified: Secondary | ICD-10-CM | POA: Diagnosis not present

## 2022-04-22 MED ORDER — LEVOTHYROXINE SODIUM 88 MCG PO TABS: 88.0000 ug | ORAL_TABLET | Freq: Every day | ORAL | 1 refills | Status: DC

## 2022-04-22 MED ORDER — AMLODIPINE BESYLATE 5 MG PO TABS: ORAL_TABLET | ORAL | 1 refills | Status: DC

## 2022-04-22 MED ORDER — ESCITALOPRAM OXALATE 20 MG PO TABS: ORAL_TABLET | ORAL | 1 refills | Status: DC

## 2022-04-22 MED ORDER — ATORVASTATIN CALCIUM 40 MG PO TABS: 40.0000 mg | ORAL_TABLET | ORAL | 1 refills | Status: DC

## 2022-04-22 MED ORDER — OMEPRAZOLE 40 MG PO CPDR: 40.0000 mg | DELAYED_RELEASE_CAPSULE | Freq: Every day | ORAL | 1 refills | Status: DC

## 2022-04-22 MED ORDER — CLONAZEPAM 0.5 MG PO TABS: 0.5000 mg | ORAL_TABLET | Freq: Two times a day (BID) | ORAL | 5 refills | Status: DC | PRN

## 2022-04-22 NOTE — Progress Notes (Signed)
Subjective:    Patient ID: Peggy Smith, female    DOB: 03-30-1946, 76 y.o.   MRN: [id number]   Chief Complaint: medical management of chronic issues     HPI:  Peggy Smith is a 76 y.o. who identifies as a female who was assigned female at birth.   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 Primary hypertension No c/o chest pain, sob or headache. Does not check blood pressure at home. BP Readings from Last 3 Encounters:  04/22/22 113/76  12/17/21 (!) 110/58  10/15/21 110/71    2. Mixed hyperlipidemia Does try to watch diet. Does no dedicated exercise. Lab Results  Component Value Date   CHOL 99 (L) 10/18/2021   HDL 47 10/18/2021   LDLCALC 34 10/18/2021   TRIG 91 10/18/2021   CHOLHDL 2.1 10/18/2021     3. Coronary artery disease involving native coronary artery of native heart without angina pectoris Last saw cardiology on 12/17/21. Review of office note revealed no change on plan of care.  4. Acquired hypothyroidism No problems that she is aware of. Lab Results  Component Value Date   TSH 1.880 04/16/2021     5. Gastroesophageal reflux disease without esophagitis Is on omperazole daily and is working well for her  6. Iron deficiency anemia, unspecified iron deficiency anemia type No c/o fatigue. Lab Results  Component Value Date   HGB 12.5 10/18/2021     7. Panic attacks 8. Anxiety state Has a very anxious personailty. Has fought panic attacks and anxiety her whole life. She is on combination of lexapro and klonopin.    04/22/2022   12:31 PM 10/15/2021   12:18 PM 04/16/2021   12:25 PM 01/02/2021   12:05 PM  GAD 7 : Generalized Anxiety Score  Nervous, Anxious, on Edge 0 0 0 0  Control/stop worrying 0 0 0 0  Worry too much - different things 0 0 0 0  Trouble relaxing 0 0 1 1  Restless 0 0 0 0  Easily annoyed or  irritable 0 0 0 0  Afraid - awful might happen 0 0 0 0  Total GAD 7 Score 0 0 1 1  Anxiety Difficulty Not difficult at all Not difficult at all Not difficult at all Not difficult at all     9. Tobacco abuse Was down to 2 cigarettes a day in may. Back up to 1/2 pack a day   New complaints: None today  Allergies  Allergen Reactions   Crestor [Rosuvastatin]     Muscle aches   Macrodantin Other (See Comments)    Liver shut down   Sulfa Antibiotics Other (See Comments)    agitation   Codeine Nausea And Vomiting        Outpatient Encounter Medications as of 04/22/2022  Medication Sig   amLODipine (NORVASC) 5 MG tablet TAKE 1 TABLET BY MOUTH EVERY DAY   aspirin 81 MG tablet Take 81 mg by mouth every morning.   atorvastatin (LIPITOR) 40 MG tablet Take 1 tablet (40 mg total) by mouth 2 (two) times a week. Take Mon, Thur   Bempedoic Acid-Ezetimibe (NEXLIZET) 180-10 MG TABS Take 1 tablet by mouth daily.   calcium carbonate (OS-CAL) 600 MG TABS Take 600 mg by mouth every evening.   Cholecalciferol (VITAMIN D-3) 5000 UNITS TABS Take 5,000 Units by mouth every evening.  clonazePAM (KLONOPIN) 0.5 MG tablet Take 1 tablet (0.5 mg total) by mouth 2 (two) times daily as needed for anxiety.   escitalopram (LEXAPRO) 20 MG tablet TAKE 1 TABLET BY MOUTH EVERYDAY AT BEDTIME   fluticasone (FLONASE) 50 MCG/ACT nasal spray SPRAY 2 SPRAYS INTO EACH NOSTRIL EVERY DAY   levothyroxine (SYNTHROID) 88 MCG tablet Take 1 tablet (88 mcg total) by mouth daily.   magnesium 30 MG tablet Take 1 tablet (30 mg total) by mouth daily.   Multiple Vitamins-Minerals (CENTRUM SILVER PO) Take 1 tablet by mouth every morning.   omeprazole (PRILOSEC) 40 MG capsule Take 1 capsule (40 mg total) by mouth daily.   Riboflavin (VITAMIN B-2 PO) Take 100 mg by mouth every morning.   No facility-administered encounter medications on file as of 04/22/2022.    Past Surgical History:  Procedure Laterality Date   COLONOSCOPY      CYSTOSCOPY  11/08/2011   Procedure: CYSTOSCOPY;  Surgeon: Delice Lesch, MD;  Location: [facility];  Service: Gynecology;  Laterality: N/A;   DILATION AND CURETTAGE OF UTERUS     LAPAROSCOPIC HYSTERECTOMY  11/08/2011   Procedure: HYSTERECTOMY TOTAL LAPAROSCOPIC;  Surgeon: Delice Lesch, MD;  Location: [facility];  Service: Gynecology;  Laterality: N/A;  with Bilateral Salpingo-Oophorectomies   MELANOMA EXCISION     Left Shoulder   SVD      X 4   TUBAL LIGATION     UPPER GASTROINTESTINAL ENDOSCOPY      Family History  Problem Relation Age of Onset   Dementia Mother    Brain cancer Father    Anxiety disorder Brother    Asthma Son    Colon cancer Neg Hx    Breast cancer Neg Hx       Controlled substance contract: 04/22/22     Review of Systems  Constitutional:  Negative for diaphoresis.  Eyes:  Negative for pain.  Respiratory:  Negative for shortness of breath.   Cardiovascular:  Negative for chest pain, palpitations and leg swelling.  Gastrointestinal:  Negative for abdominal pain.  Endocrine: Negative for polydipsia.  Skin:  Negative for rash.  Neurological:  Negative for dizziness, weakness and headaches.  Hematological:  Does not bruise/bleed easily.  All other systems reviewed and are negative.      Objective:   Physical Exam Vitals and nursing note reviewed.  Constitutional:      General: She is not in acute distress.    Appearance: Normal appearance. She is well-developed.  HENT:     Head: Normocephalic.     Right Ear: Tympanic membrane normal.     Left Ear: Tympanic membrane normal.     Nose: Nose normal.     Mouth/Throat:     Mouth: Mucous membranes are moist.  Eyes:     Pupils: Pupils are equal, round, and reactive to light.  Neck:     Vascular: No carotid bruit or JVD.  Cardiovascular:     Rate and Rhythm: Normal rate and regular rhythm.     Heart sounds: Normal heart sounds.  Pulmonary:     Effort: Pulmonary effort is normal. No respiratory distress.      Breath sounds: Normal breath sounds. No wheezing or rales.  Chest:     Chest wall: No tenderness.  Abdominal:     General: Bowel sounds are normal. There is no distension or abdominal bruit.     Palpations: Abdomen is soft. There is no hepatomegaly, splenomegaly, mass or pulsatile mass.  Tenderness: There is no abdominal tenderness.  Musculoskeletal:        General: Normal range of motion.     Cervical back: Normal range of motion and neck supple.  Lymphadenopathy:     Cervical: No cervical adenopathy.  Skin:    General: Skin is warm and dry.  Neurological:     Mental Status: She is alert and oriented to person, place, and time.     Deep Tendon Reflexes: Reflexes are normal and symmetric.  Psychiatric:        Behavior: Behavior normal.        Thought Content: Thought content normal.        Judgment: Judgment normal.     BP 113/76   Pulse 88   Temp 97.9 F (36.6 C) (Temporal)   Resp 20   Ht 5' 8" (1.727 m)   Wt 189 lb (85.7 kg)   SpO2 94%   BMI 28.74 kg/m        Assessment & Plan:   Peggy Smith comes in today with chief complaint of Medical Management of Chronic Issues   Diagnosis and orders addressed:  1. Primary hypertension Low sodium diet - amLODipine (NORVASC) 5 MG tablet; TAKE 1 TABLET BY MOUTH EVERY DAY  Dispense: 90 tablet; Refill: 1 - CBC with Differential/Platelet - CMP14+EGFR  2. Mixed hyperlipidemia Low fat diet - atorvastatin (LIPITOR) 40 MG tablet; Take 1 tablet (40 mg total) by mouth 2 (two) times a week. Take Rolene Course  Dispense: 90 tablet; Refill: 1 - Lipid panel  3. Coronary artery disease involving native coronary artery of native heart without angina pectoris Follow up with cardiology   4. Acquired hypothyroidism Labs pending - levothyroxine (SYNTHROID) 88 MCG tablet; Take 1 tablet (88 mcg total) by mouth daily.  Dispense: 90 tablet; Refill: 1 - Thyroid Panel With TSH  5. Gastroesophageal reflux disease without  esophagitis Avoid spicy foods Do not eat 2 hours prior to bedtime - omeprazole (PRILOSEC) 40 MG capsule; Take 1 capsule (40 mg total) by mouth daily.  Dispense: 90 capsule; Refill: 1  6. Iron deficiency anemia, unspecified iron deficiency anemia type Labs oending  7. Panic attacks Stress management - escitalopram (LEXAPRO) 20 MG tablet; TAKE 1 TABLET BY MOUTH EVERYDAY AT BEDTIME  Dispense: 90 tablet; Refill: 1  8. Anxiety state - ToxASSURE Select 13 (MW), Urine - clonazePAM (KLONOPIN) 0.5 MG tablet; Take 1 tablet (0.5 mg total) by mouth 2 (two) times daily as needed for anxiety.  Dispense: 60 tablet; Refill: 5  9. Tobacco abuse Smoking cessation encouraged   Labs pending Health Maintenance reviewed Diet and exercise encouraged  Follow up plan: 6 months   [name] Hassell Done, FNP

## 2022-04-22 NOTE — Patient Instructions (Signed)

## 2022-04-23 LAB — CBC WITH DIFFERENTIAL/PLATELET
Basophils Absolute: 0.1 10*3/uL (ref 0.0–0.2)
Basos: 1 %
EOS (ABSOLUTE): 0.1 10*3/uL (ref 0.0–0.4)
Eos: 2 %
Hematocrit: 38.4 % (ref 34.0–46.6)
Hemoglobin: 11.8 g/dL (ref 11.1–15.9)
Immature Grans (Abs): 0 10*3/uL (ref 0.0–0.1)
Immature Granulocytes: 0 %
Lymphocytes Absolute: 2.8 10*3/uL (ref 0.7–3.1)
Lymphs: 35 %
MCH: 26.3 pg — ABNORMAL LOW (ref 26.6–33.0)
MCHC: 30.7 g/dL — ABNORMAL LOW (ref 31.5–35.7)
MCV: 86 fL (ref 79–97)
Monocytes Absolute: 0.6 10*3/uL (ref 0.1–0.9)
Monocytes: 8 %
Neutrophils Absolute: 4.2 10*3/uL (ref 1.4–7.0)
Neutrophils: 54 %
Platelets: 428 10*3/uL (ref 150–450)
RBC: 4.49 x10E6/uL (ref 3.77–5.28)
RDW: 13.3 % (ref 11.7–15.4)
WBC: 7.8 10*3/uL (ref 3.4–10.8)

## 2022-04-23 LAB — CMP14+EGFR
ALT: 16 IU/L (ref 0–32)
AST: 23 IU/L (ref 0–40)
Albumin/Globulin Ratio: 1.8 (ref 1.2–2.2)
Albumin: 4.6 g/dL (ref 3.8–4.8)
Alkaline Phosphatase: 92 IU/L (ref 44–121)
BUN/Creatinine Ratio: 18 (ref 12–28)
BUN: 16 mg/dL (ref 8–27)
Bilirubin Total: 0.3 mg/dL (ref 0.0–1.2)
CO2: 23 mmol/L (ref 20–29)
Calcium: 9.3 mg/dL (ref 8.7–10.3)
Chloride: 101 mmol/L (ref 96–106)
Creatinine, Ser: 0.91 mg/dL (ref 0.57–1.00)
Globulin, Total: 2.5 g/dL (ref 1.5–4.5)
Glucose: 98 mg/dL (ref 70–99)
Potassium: 4.6 mmol/L (ref 3.5–5.2)
Sodium: 137 mmol/L (ref 134–144)
Total Protein: 7.1 g/dL (ref 6.0–8.5)
eGFR: 65 mL/min/{1.73_m2} (ref >59)

## 2022-04-23 LAB — LIPID PANEL
Chol/HDL Ratio: 2.4 ratio (ref 0.0–4.4)
Cholesterol, Total: 107 mg/dL (ref 100–199)
HDL: 44 mg/dL (ref >39)
LDL Chol Calc (NIH): 41 mg/dL (ref 0–99)
Triglycerides: 122 mg/dL (ref 0–149)
VLDL Cholesterol Cal: 22 mg/dL (ref 5–40)

## 2022-04-23 LAB — THYROID PANEL WITH TSH
Free Thyroxine Index: 2.3 (ref 1.2–4.9)
T3 Uptake Ratio: 26 % (ref 24–39)
T4, Total: 8.8 ug/dL (ref 4.5–12.0)
TSH: 0.657 u[IU]/mL (ref 0.450–4.500)

## 2022-04-23 LAB — VITAMIN D 25 HYDROXY (VIT D DEFICIENCY, FRACTURES): Vit D, 25-Hydroxy: 80.8 ng/mL (ref 30.0–100.0)

## 2022-04-25 LAB — TOXASSURE SELECT 13 (MW), URINE

## 2022-06-02 IMAGING — MG MM DIGITAL SCREENING BILAT W/ TOMO AND CAD
8 series · 8 of 24 positions shown · non-contrast
Comparison: Previous exam(s).

CLINICAL DATA: Screening.

EXAM:
DIGITAL SCREENING BILATERAL MAMMOGRAM WITH TOMOSYNTHESIS AND CAD
TECHNIQUE: Bilateral screening digital craniocaudal and mediolateral oblique
mammograms were obtained. Bilateral screening digital breast
tomosynthesis was performed. The images were evaluated with
computer-aided detection.

[L CC synth-2D]
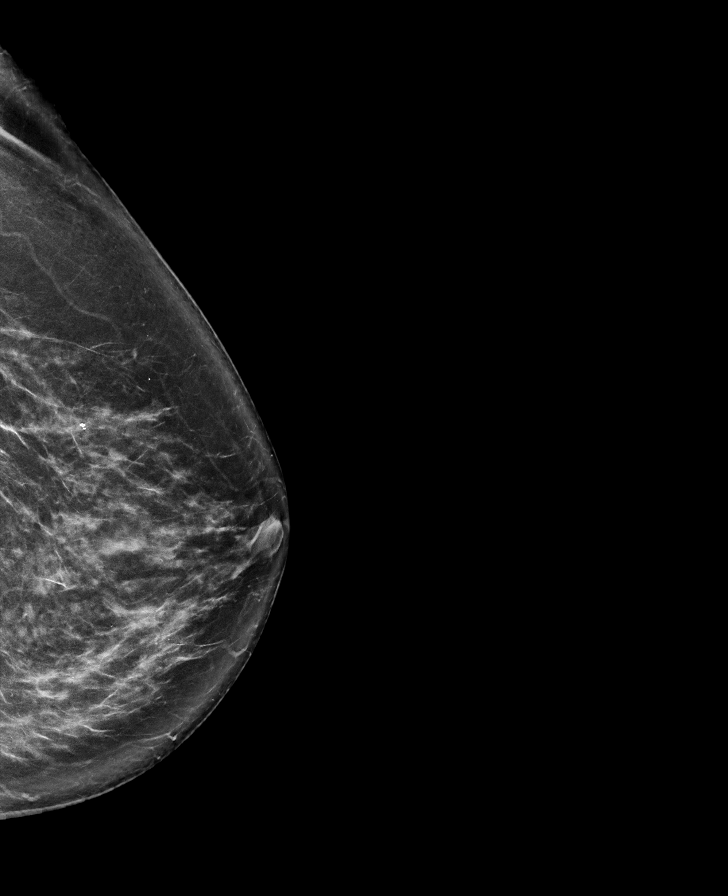

[R CC synth-2D]
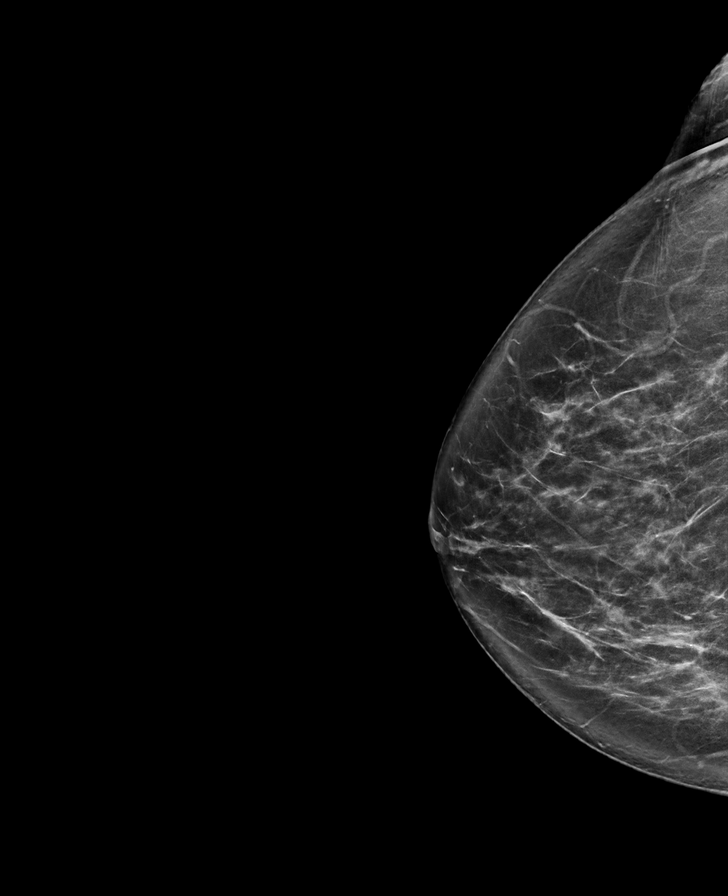

[L MLO synth-2D]
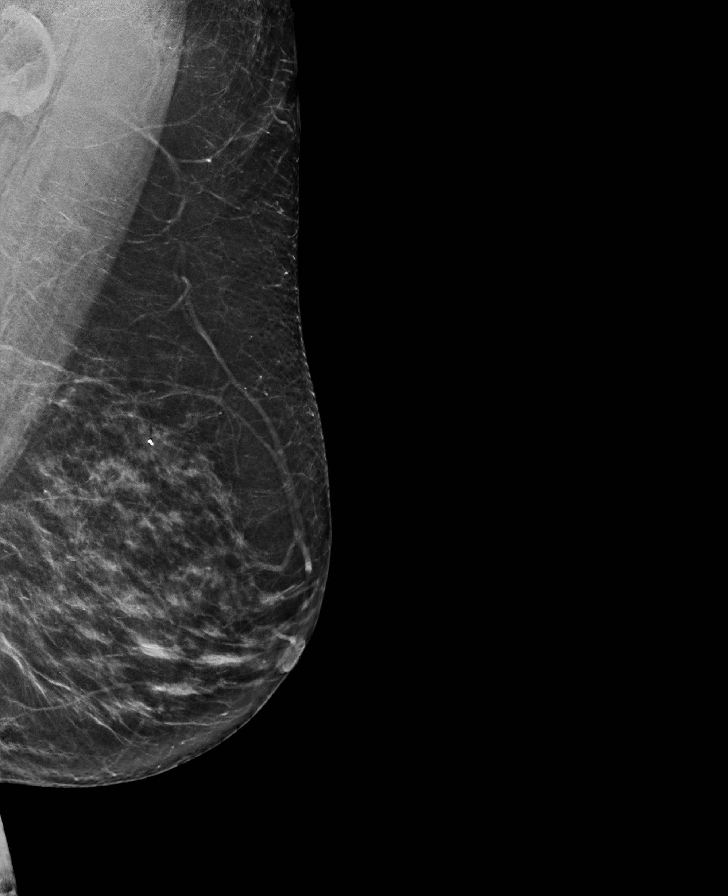

[R MLO synth-2D]
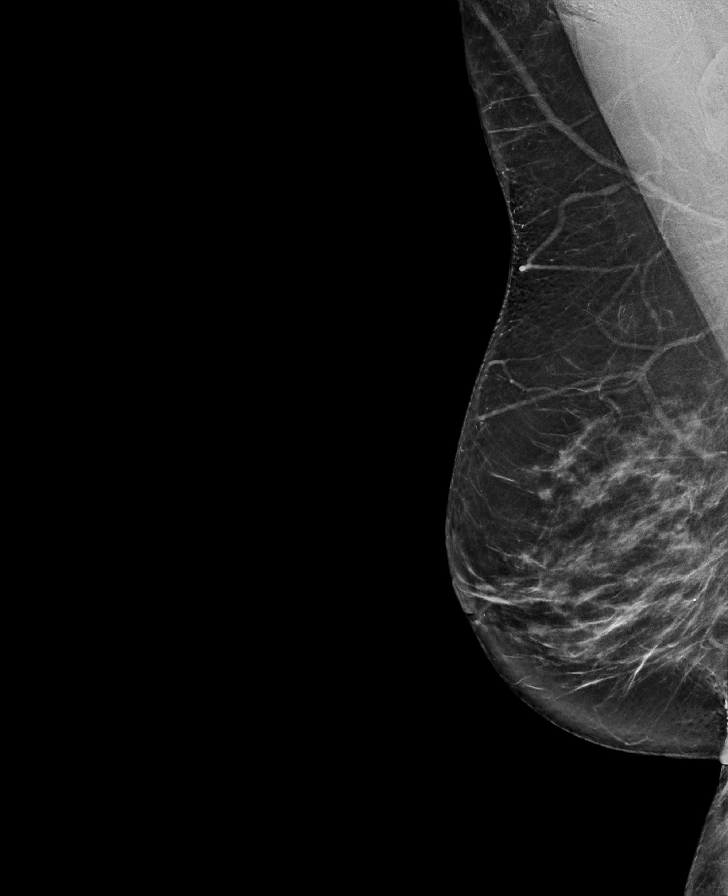

[L MLO tomo · tomo slice 37/72.0]
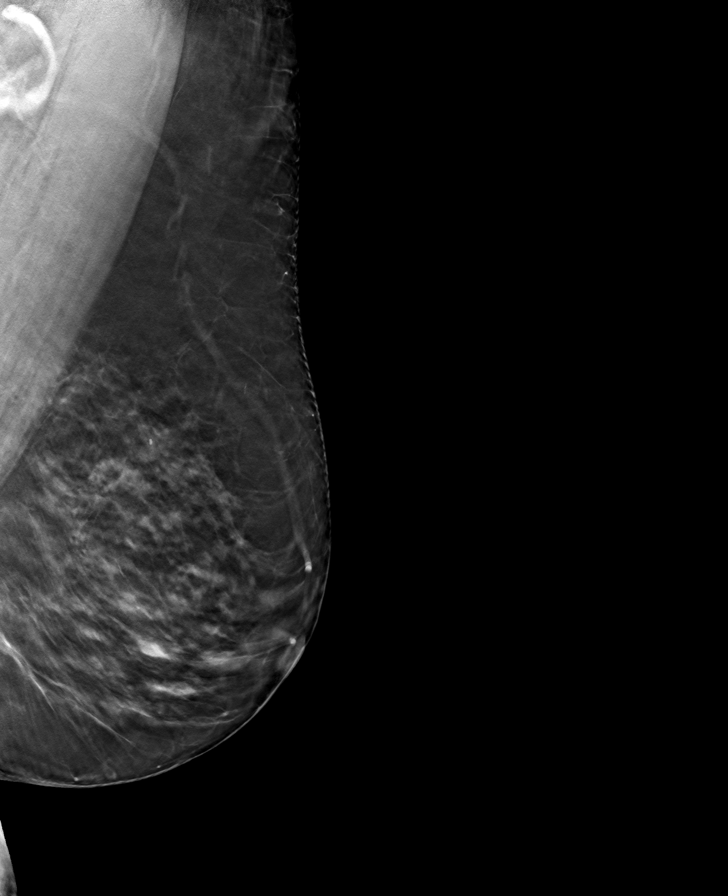

[L CC tomo · tomo slice 41/82.0]
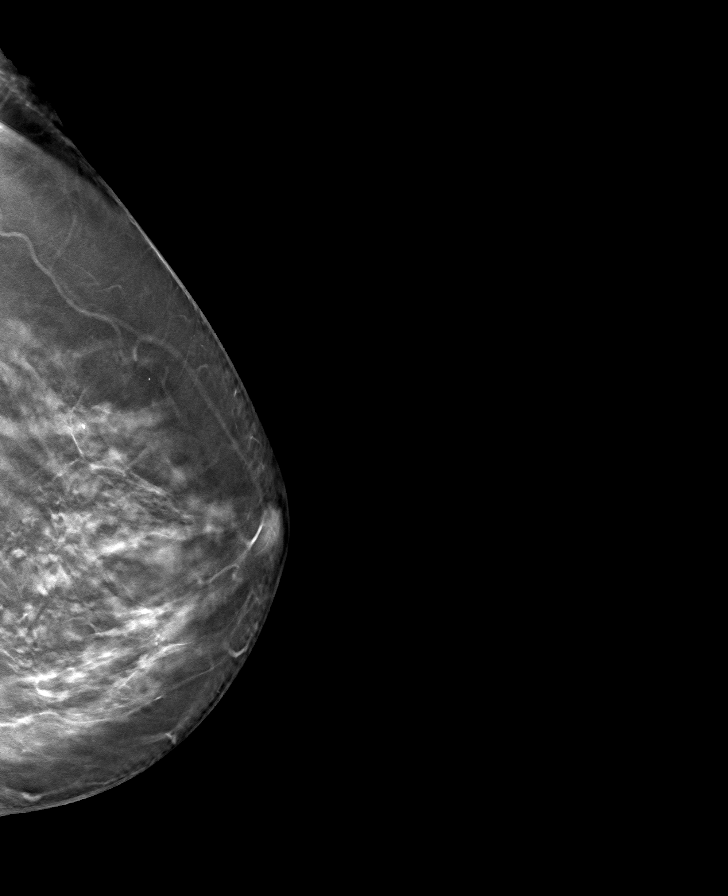

[R CC tomo · tomo slice 39/76.0]
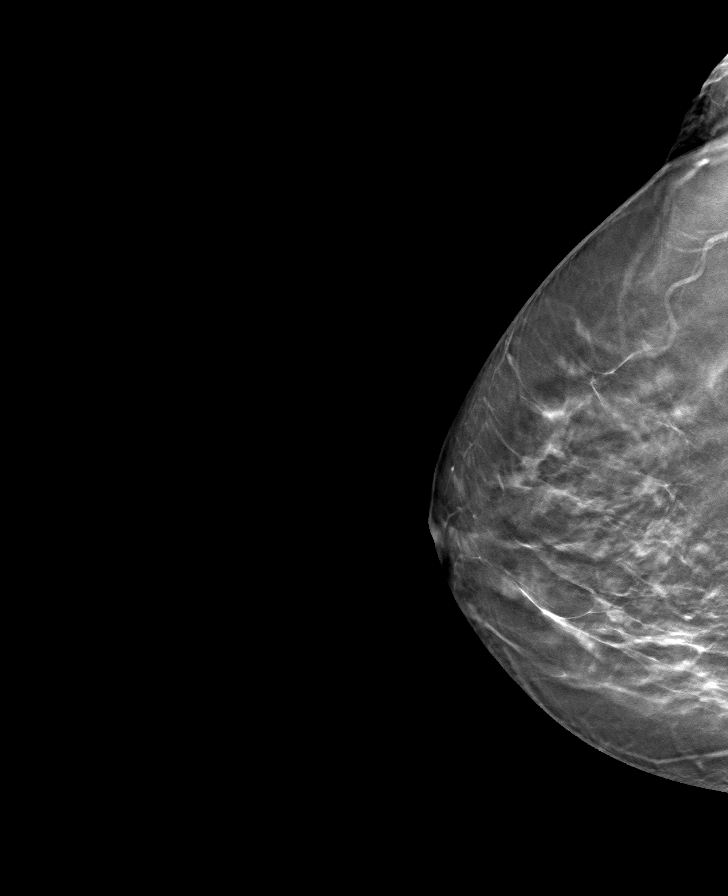

[R MLO tomo · tomo slice 40/79.0]
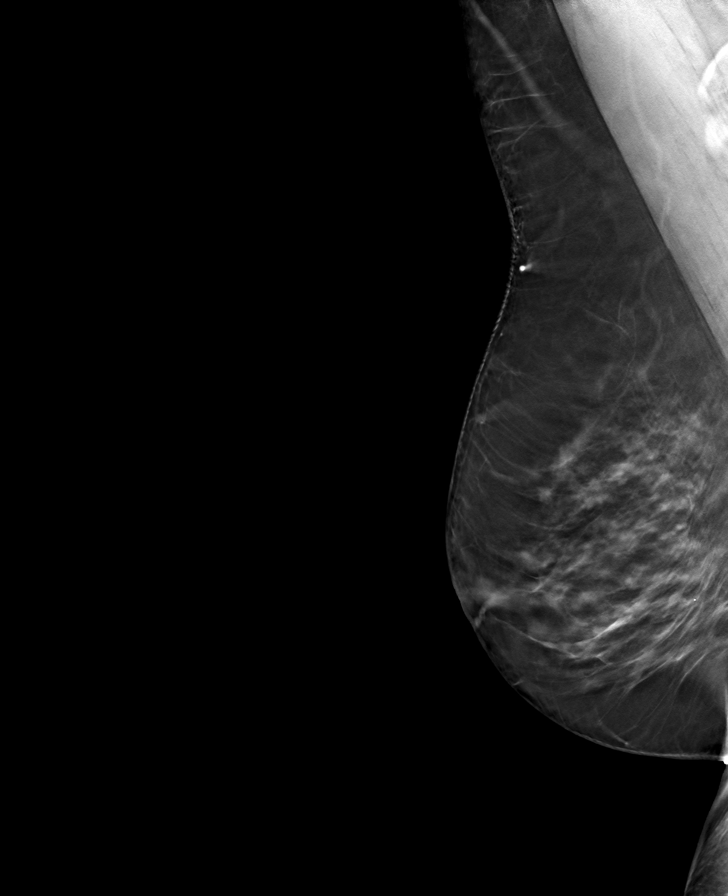

[8 of 24 positions shown; findings below may reference images not displayed]

ACR Breast Density Category c: The breast tissue is heterogeneously
dense, which may obscure small masses.
FINDINGS: There are no findings suspicious for malignancy.
IMPRESSION: No mammographic evidence of malignancy. A result letter of this
screening mammogram will be mailed directly to the patient.

RECOMMENDATION:
Screening mammogram in one year. (Code:Q3-W-BC3)

BI-RADS CATEGORY  1: Negative.

## 2022-06-13 ENCOUNTER — Other Ambulatory Visit: Payer: Self-pay | Admitting: Internal Medicine

## 2022-06-14 MED ORDER — NEXLIZET 180-10 MG PO TABS: 1.0000 | ORAL_TABLET | Freq: Every day | ORAL | 6 refills | Status: DC

## 2022-06-17 ENCOUNTER — Encounter: Payer: Self-pay | Admitting: Internal Medicine

## 2022-07-13 ENCOUNTER — Other Ambulatory Visit: Payer: Self-pay | Admitting: Nurse Practitioner

## 2022-07-13 DIAGNOSIS — F411 Generalized anxiety disorder: Secondary | ICD-10-CM

## 2022-08-08 NOTE — Progress Notes (Signed)
Patient referred to [facility] but due to age, patient is not a candidate for lung cancer screening. Referral closed per protocol.

## 2022-09-08 ENCOUNTER — Other Ambulatory Visit: Payer: Self-pay | Admitting: Nurse Practitioner

## 2022-09-08 DIAGNOSIS — K219 Gastro-esophageal reflux disease without esophagitis: Secondary | ICD-10-CM

## 2022-09-08 DIAGNOSIS — J301 Allergic rhinitis due to pollen: Secondary | ICD-10-CM

## 2022-09-14 ENCOUNTER — Emergency Department (HOSPITAL_COMMUNITY): Payer: Medicare PPO

## 2022-09-14 ENCOUNTER — Other Ambulatory Visit: Payer: Self-pay

## 2022-09-14 ENCOUNTER — Encounter (HOSPITAL_COMMUNITY): Payer: Self-pay | Admitting: Emergency Medicine

## 2022-09-14 ENCOUNTER — Emergency Department (HOSPITAL_COMMUNITY)
Admission: EM | Admit: 2022-09-14 | Discharge: 2022-09-14 | Disposition: A | Discharge status: Home / Self Care | Payer: Medicare PPO | Attending: Emergency Medicine | Admitting: Emergency Medicine

## 2022-09-14 DIAGNOSIS — Z79899 Other long term (current) drug therapy: Secondary | ICD-10-CM | POA: Insufficient documentation

## 2022-09-14 DIAGNOSIS — R0789 Other chest pain: Secondary | ICD-10-CM | POA: Diagnosis not present

## 2022-09-14 DIAGNOSIS — R079 Chest pain, unspecified: Secondary | ICD-10-CM | POA: Insufficient documentation

## 2022-09-14 DIAGNOSIS — I10 Essential (primary) hypertension: Secondary | ICD-10-CM | POA: Diagnosis not present

## 2022-09-14 DIAGNOSIS — Z85820 Personal history of malignant melanoma of skin: Secondary | ICD-10-CM | POA: Diagnosis not present

## 2022-09-14 DIAGNOSIS — R072 Precordial pain: Secondary | ICD-10-CM | POA: Diagnosis not present

## 2022-09-14 DIAGNOSIS — I700 Atherosclerosis of aorta: Secondary | ICD-10-CM | POA: Diagnosis not present

## 2022-09-14 DIAGNOSIS — Z7982 Long term (current) use of aspirin: Secondary | ICD-10-CM | POA: Diagnosis not present

## 2022-09-14 DIAGNOSIS — E039 Hypothyroidism, unspecified: Secondary | ICD-10-CM | POA: Diagnosis not present

## 2022-09-14 LAB — CBC
HCT: 36.9 % (ref 36.0–46.0)
Hemoglobin: 11.8 g/dL — ABNORMAL LOW (ref 12.0–15.0)
MCH: 25.7 pg — ABNORMAL LOW (ref 26.0–34.0)
MCHC: 32 g/dL (ref 30.0–36.0)
MCV: 80.4 fL (ref 80.0–100.0)
Platelets: 415 10*3/uL — ABNORMAL HIGH (ref 150–400)
RBC: 4.59 MIL/uL (ref 3.87–5.11)
RDW: 14.7 % (ref 11.5–15.5)
WBC: 9.8 10*3/uL (ref 4.0–10.5)
nRBC: 0 % (ref 0.0–0.2)

## 2022-09-14 LAB — TROPONIN I (HIGH SENSITIVITY)
Troponin I (High Sensitivity): 3 ng/L (ref <18)
Troponin I (High Sensitivity): 2 ng/L (ref <18)

## 2022-09-14 LAB — HEPATIC FUNCTION PANEL
ALT: 16 U/L (ref 0–44)
AST: 24 U/L (ref 15–41)
Albumin: 4.1 g/dL (ref 3.5–5.0)
Alkaline Phosphatase: 79 U/L (ref 38–126)
Bilirubin, Direct: 0.1 mg/dL (ref 0.0–0.2)
Indirect Bilirubin: 0.5 mg/dL (ref 0.3–0.9)
Total Bilirubin: 0.6 mg/dL (ref 0.3–1.2)
Total Protein: 7.6 g/dL (ref 6.5–8.1)

## 2022-09-14 LAB — BASIC METABOLIC PANEL
Anion gap: 11 (ref 5–15)
BUN: 18 mg/dL (ref 8–23)
CO2: 25 mmol/L (ref 22–32)
Calcium: 9.1 mg/dL (ref 8.9–10.3)
Chloride: 100 mmol/L (ref 98–111)
Creatinine, Ser: 0.89 mg/dL (ref 0.44–1.00)
GFR, Estimated: >60 mL/min (ref >60)
Glucose, Bld: 140 mg/dL — ABNORMAL HIGH (ref 70–99)
Potassium: 4 mmol/L (ref 3.5–5.1)
Sodium: 136 mmol/L (ref 135–145)

## 2022-09-14 LAB — LACTIC ACID, PLASMA: Lactic Acid, Venous: 1 mmol/L (ref 0.5–1.9)

## 2022-09-14 MED ORDER — ALUM & MAG HYDROXIDE-SIMETH 200-200-20 MG/5ML PO SUSP
30.0000 mL | Freq: Once | ORAL | Status: AC
  Administered 2022-09-14: 30 mL via ORAL
  Filled 2022-09-14: qty 30

## 2022-09-14 MED ORDER — ONDANSETRON HCL 4 MG/2ML IJ SOLN
4.0000 mg | Freq: Once | INTRAMUSCULAR | Status: AC
  Administered 2022-09-14: 4 mg via INTRAVENOUS
  Filled 2022-09-14: qty 2

## 2022-09-14 NOTE — ED Triage Notes (Signed)
Patient c/o mid-sternal, non-radiating chest pain that woke her this morning at 2am. Per patient nausea with out vomiting. Denies any change in her normal shortness of breath. Denies any dizziness, lightheadedness back pian,  or weakness. Denies any cardiac hx.

## 2022-09-14 NOTE — ED Provider Notes (Signed)
[place] Provider Note   CSN: [id number] Arrival date & time: 09/14/22  [id number]     History  Chief Complaint  Patient presents with   Chest Pain    Peggy Smith is a 77 y.o. female.   Chest Pain Patient presents with chest pain.  Woke up with pain at 2 in the morning.  States lower chest.  Has nausea without vomiting.  Was initially pressure but now more of a pain.  Was doing fine yesterday.  No exertional pain.  No vomiting.  No blood in the stool.  States she had an episode 2 weeks ago that was similar.  States that went away on its own.  States she has been trying to watch what she eats since then.    Past Medical History:  Diagnosis Date   Angina    STRESS TEST NORMAL-ANXIETY   Anxiety    Cardiac arrhythmia due to congenital heart disease    Depression    GERD (gastroesophageal reflux disease)    Headache(784.0)    OTC MEDS   Hepatitis     1976   Hyperlipidemia    DOES NOT TAKE MEDS - HAS THEM AT HOME BUT STATES DOES NOT TAKE   Hypertension    Hypothyroidism    Iron deficiency anemia    Melanoma (HCC)    Panic attacks    PONV (postoperative nausea and vomiting)     Home Medications Prior to Admission medications   Medication Sig Start Date End Date Taking? Authorizing Provider  amLODipine (NORVASC) 5 MG tablet TAKE 1 TABLET BY MOUTH EVERY DAY 04/22/22   Hassell Done [name], FNP  aspirin 81 MG tablet Take 81 mg by mouth every morning.    [provider]  atorvastatin (LIPITOR) 40 MG tablet Take 1 tablet (40 mg total) by mouth 2 (two) times a week. Take Rolene Course 04/22/22   Chevis Pretty, FNP  Bempedoic Acid-Ezetimibe (NEXLIZET) 180-10 MG TABS Take 1 tablet by mouth daily. 06/14/22   Chandrasekhar, Lyda Kalata A, MD  calcium carbonate (OS-CAL) 600 MG TABS Take 600 mg by mouth every evening.    [provider]  Cholecalciferol (VITAMIN D-3) 5000 UNITS TABS Take 5,000 Units by mouth every  evening.    [provider]  clonazePAM (KLONOPIN) 0.5 MG tablet Take 1 tablet (0.5 mg total) by mouth 2 (two) times daily as needed for anxiety. 04/22/22   Hassell Done, [name], FNP  escitalopram (LEXAPRO) 20 MG tablet TAKE 1 TABLET BY MOUTH EVERYDAY AT BEDTIME 04/22/22   Hassell Done, [name], FNP  fluticasone [facility]) 50 MCG/ACT nasal spray SPRAY 2 SPRAYS INTO EACH NOSTRIL EVERY DAY 09/09/22   Hassell Done, [name], FNP  levothyroxine (SYNTHROID) 88 MCG tablet Take 1 tablet (88 mcg total) by mouth daily. 04/22/22 04/22/23  Hassell Done [name], FNP  magnesium 30 MG tablet Take 1 tablet (30 mg total) by mouth daily. 06/23/20   Hassell Done [name], FNP  Multiple Vitamins-Minerals (CENTRUM SILVER PO) Take 1 tablet by mouth every morning.    [provider]  omeprazole (PRILOSEC) 40 MG capsule TAKE 1 CAPSULE (40 MG TOTAL) BY MOUTH DAILY. 09/09/22   Hassell Done, [name], FNP  Riboflavin (VITAMIN B-2 PO) Take 100 mg by mouth every morning.    [provider]      Allergies    Crestor [rosuvastatin], Macrodantin, Morphine, Sulfa antibiotics, and Codeine    Review of Systems   Review of Systems  Cardiovascular:  Positive for chest pain.  Physical Exam Updated Vital Signs BP (!) 155/80   Pulse 71   Temp 98.1 F (36.7 C) (Oral)   Resp 20   Ht 5' 8"$  (1.727 m)   Wt 81.6 kg   SpO2 92%   BMI 27.37 kg/m  Physical Exam Vitals and nursing note reviewed.  Constitutional:      Appearance: She is well-developed.  Cardiovascular:     Rate and Rhythm: Regular rhythm.  Chest:     Chest wall: No tenderness.  Abdominal:     Tenderness: There is no abdominal tenderness.     ED Results / Procedures / Treatments   Labs (all labs ordered are listed, but only abnormal results are displayed) Labs Reviewed  BASIC METABOLIC PANEL - Abnormal; Notable for the following components:      Result Value   Glucose, Bld 140 (*)    All other components within normal limits  CBC  - Abnormal; Notable for the following components:   Hemoglobin 11.8 (*)    MCH 25.7 (*)    Platelets 415 (*)    All other components within normal limits  LACTIC ACID, PLASMA  HEPATIC FUNCTION PANEL  TROPONIN I (HIGH SENSITIVITY)  TROPONIN I (HIGH SENSITIVITY)    EKG None  Radiology DG Chest 1 View  Result Date: 09/14/2022 CLINICAL DATA:  77 year old female with history of mid sternal chest pain. EXAM: CHEST  1 VIEW COMPARISON:  Chest x-ray 08/17/2012. FINDINGS: Lung volumes are normal. No consolidative airspace disease. No pleural effusions. No pneumothorax. No pulmonary nodule or mass noted. Pulmonary vasculature and the cardiomediastinal silhouette are within normal limits. Atherosclerosis in the thoracic aorta. IMPRESSION: 1.  No radiographic evidence of acute cardiopulmonary disease. 2. Aortic atherosclerosis. Electronically Signed   By: Vinnie Langton M.D.   On: 09/14/2022 08:49    Procedures Procedures    Medications Ordered in ED Medications  alum & mag hydroxide-simeth (MAALOX/MYLANTA) 200-200-20 MG/5ML suspension 30 mL (30 mLs Oral Given 09/14/22 1019)  ondansetron (ZOFRAN) injection 4 mg (4 mg Intravenous Given 09/14/22 1001)    ED Course/ Medical Decision Making/ A&P                             Medical Decision Making Amount and/or Complexity of Data Reviewed Labs: ordered. Radiology: ordered.  Risk OTC drugs. Prescription drug management.   Patient with lower chest potentially upper abdominal pain.  Began into the morning.  Had episode 2 weeks ago.  Reviewed cardiology note.  Has nonobstructive coronary disease.  EKG reassuring.  Differential diagnose includes noncardiac chest pain, cardiac chest pain GI cause.  Pneumonia.  Aortic injury felt less likely.  Will get basic blood work and x-ray.  Workup reassuring.  Blood work reassuring.  Troponin negative x 2.  X-ray no clear cause of pain.  Feels somewhat better after GI cocktail.  Outpatient follow-up as  needed.        Final Clinical Impression(s) / ED Diagnoses Final diagnoses:  Nonspecific chest pain    Rx / DC Orders ED Discharge Orders     None         Davonna Belling, MD 09/14/22 1102

## 2022-10-06 ENCOUNTER — Emergency Department (HOSPITAL_COMMUNITY): Payer: Medicare PPO

## 2022-10-06 ENCOUNTER — Emergency Department (HOSPITAL_COMMUNITY)
Admission: EM | Admit: 2022-10-06 | Discharge: 2022-10-06 | Disposition: A | Discharge status: Home / Self Care | Payer: Medicare PPO | Attending: Emergency Medicine | Admitting: Emergency Medicine

## 2022-10-06 ENCOUNTER — Encounter (HOSPITAL_COMMUNITY): Payer: Self-pay

## 2022-10-06 ENCOUNTER — Other Ambulatory Visit: Payer: Self-pay

## 2022-10-06 DIAGNOSIS — R1010 Upper abdominal pain, unspecified: Secondary | ICD-10-CM | POA: Diagnosis present

## 2022-10-06 DIAGNOSIS — R079 Chest pain, unspecified: Secondary | ICD-10-CM | POA: Diagnosis not present

## 2022-10-06 DIAGNOSIS — R1013 Epigastric pain: Secondary | ICD-10-CM | POA: Diagnosis not present

## 2022-10-06 DIAGNOSIS — Z7982 Long term (current) use of aspirin: Secondary | ICD-10-CM | POA: Diagnosis not present

## 2022-10-06 DIAGNOSIS — R0789 Other chest pain: Secondary | ICD-10-CM | POA: Diagnosis not present

## 2022-10-06 DIAGNOSIS — K8020 Calculus of gallbladder without cholecystitis without obstruction: Secondary | ICD-10-CM | POA: Diagnosis not present

## 2022-10-06 DIAGNOSIS — R112 Nausea with vomiting, unspecified: Secondary | ICD-10-CM | POA: Insufficient documentation

## 2022-10-06 DIAGNOSIS — I700 Atherosclerosis of aorta: Secondary | ICD-10-CM | POA: Diagnosis not present

## 2022-10-06 DIAGNOSIS — R1011 Right upper quadrant pain: Secondary | ICD-10-CM | POA: Diagnosis not present

## 2022-10-06 LAB — COMPREHENSIVE METABOLIC PANEL
ALT: 14 U/L (ref 0–44)
AST: 28 U/L (ref 15–41)
Albumin: 4.1 g/dL (ref 3.5–5.0)
Alkaline Phosphatase: 83 U/L (ref 38–126)
Anion gap: 10 (ref 5–15)
BUN: 22 mg/dL (ref 8–23)
CO2: 25 mmol/L (ref 22–32)
Calcium: 9 mg/dL (ref 8.9–10.3)
Chloride: 100 mmol/L (ref 98–111)
Creatinine, Ser: 0.93 mg/dL (ref 0.44–1.00)
GFR, Estimated: >60 mL/min (ref >60)
Glucose, Bld: 150 mg/dL — ABNORMAL HIGH (ref 70–99)
Potassium: 3.6 mmol/L (ref 3.5–5.1)
Sodium: 135 mmol/L (ref 135–145)
Total Bilirubin: 0.4 mg/dL (ref 0.3–1.2)
Total Protein: 7.5 g/dL (ref 6.5–8.1)

## 2022-10-06 LAB — TROPONIN I (HIGH SENSITIVITY)
Troponin I (High Sensitivity): 4 ng/L (ref <18)
Troponin I (High Sensitivity): 3 ng/L (ref <18)

## 2022-10-06 LAB — URINALYSIS, ROUTINE W REFLEX MICROSCOPIC
Bilirubin Urine: NEGATIVE
Glucose, UA: NEGATIVE mg/dL
Hgb urine dipstick: NEGATIVE
Ketones, ur: NEGATIVE mg/dL
Leukocytes,Ua: NEGATIVE
Nitrite: NEGATIVE
Protein, ur: NEGATIVE mg/dL
Specific Gravity, Urine: 1.015 (ref 1.005–1.030)
pH: 7 (ref 5.0–8.0)

## 2022-10-06 LAB — LIPASE, BLOOD: Lipase: 44 U/L (ref 11–51)

## 2022-10-06 LAB — CBC
HCT: 36.7 % (ref 36.0–46.0)
Hemoglobin: 11.7 g/dL — ABNORMAL LOW (ref 12.0–15.0)
MCH: 26.1 pg (ref 26.0–34.0)
MCHC: 31.9 g/dL (ref 30.0–36.0)
MCV: 81.7 fL (ref 80.0–100.0)
Platelets: 500 10*3/uL — ABNORMAL HIGH (ref 150–400)
RBC: 4.49 MIL/uL (ref 3.87–5.11)
RDW: 15.9 % — ABNORMAL HIGH (ref 11.5–15.5)
WBC: 13.9 10*3/uL — ABNORMAL HIGH (ref 4.0–10.5)
nRBC: 0 % (ref 0.0–0.2)

## 2022-10-06 MED ORDER — METOCLOPRAMIDE HCL 5 MG/ML IJ SOLN
5.0000 mg | Freq: Once | INTRAMUSCULAR | Status: AC
  Administered 2022-10-06: 5 mg via INTRAVENOUS
  Filled 2022-10-06: qty 2

## 2022-10-06 MED ORDER — IOHEXOL 350 MG/ML SOLN
100.0000 mL | Freq: Once | INTRAVENOUS | Status: AC | PRN
  Administered 2022-10-06: 100 mL via INTRAVENOUS

## 2022-10-06 MED ORDER — HYDROMORPHONE HCL 1 MG/ML IJ SOLN
1.0000 mg | Freq: Once | INTRAMUSCULAR | Status: AC
  Administered 2022-10-06: 1 mg via INTRAVENOUS
  Filled 2022-10-06: qty 1

## 2022-10-06 MED ORDER — HYDROCODONE-ACETAMINOPHEN 5-325 MG PO TABS: 1.0000 | ORAL_TABLET | Freq: Four times a day (QID) | ORAL | 0 refills | Status: DC | PRN

## 2022-10-06 MED ORDER — ONDANSETRON 4 MG PO TBDP: 4.0000 mg | ORAL_TABLET | Freq: Once | ORAL | Status: AC

## 2022-10-06 MED ORDER — ONDANSETRON HCL 4 MG PO TABS: 4.0000 mg | ORAL_TABLET | Freq: Three times a day (TID) | ORAL | 0 refills | Status: DC | PRN

## 2022-10-06 MED ORDER — ONDANSETRON 4 MG PO TBDP
ORAL_TABLET | ORAL | Status: AC
  Administered 2022-10-06: 4 mg via ORAL
  Filled 2022-10-06: qty 1

## 2022-10-06 NOTE — ED Notes (Signed)
Radiology called to assess delay in care for ordered ultrasound. Awaiting response

## 2022-10-06 NOTE — ED Provider Notes (Signed)
[place] Provider Note   CSN: [id number] Arrival date & time: 10/06/22  [id number]     History {Add pertinent medical, surgical, social history, OB history to HPI:1} Chief Complaint  Patient presents with   Abdominal Pain    IXEL GIGER is a 77 y.o. female.  Patient presents to the emergency department for evaluation of upper abdominal pain, chest pain, nausea and vomiting.  Patient reports that she had a similar episode 3 weeks ago and was seen in the ED.  Patient reports that the pain is in the central portion of her upper abdomen and goes all the way through into her back.  She was told several years ago that she had a gallstone but has not had any problems with it.       Home Medications Prior to Admission medications   Medication Sig Start Date End Date Taking? Authorizing Provider  amLODipine (NORVASC) 5 MG tablet TAKE 1 TABLET BY MOUTH EVERY DAY 04/22/22  Yes Hassell Done, [name], FNP  aspirin 81 MG tablet Take 81 mg by mouth every morning.   Yes [provider]  atorvastatin (LIPITOR) 40 MG tablet Take 1 tablet (40 mg total) by mouth 2 (two) times a week. Take Rolene Course 04/22/22  Yes Martin, [name], FNP  Bempedoic Acid-Ezetimibe (NEXLIZET) 180-10 MG TABS Take 1 tablet by mouth daily. 06/14/22  Yes Chandrasekhar, Mahesh A, MD  calcium carbonate (OS-CAL) 600 MG TABS Take 600 mg by mouth every evening.   Yes [provider]  Cholecalciferol (VITAMIN D-3) 5000 UNITS TABS Take 5,000 Units by mouth every evening.   Yes [provider]  clonazePAM (KLONOPIN) 0.5 MG tablet Take 1 tablet (0.5 mg total) by mouth 2 (two) times daily as needed for anxiety. 04/22/22  Yes Hassell Done, [name], FNP  escitalopram (LEXAPRO) 20 MG tablet TAKE 1 TABLET BY MOUTH EVERYDAY AT BEDTIME 04/22/22  Yes Martin, [name], FNP  fluticasone (FLONASE) 50 MCG/ACT nasal spray SPRAY 2 SPRAYS INTO EACH NOSTRIL EVERY DAY 09/09/22   Yes Hassell Done, [name], FNP  levothyroxine (SYNTHROID) 88 MCG tablet Take 1 tablet (88 mcg total) by mouth daily. 04/22/22 04/22/23 Yes Martin, [name], FNP  magnesium 30 MG tablet Take 1 tablet (30 mg total) by mouth daily. 06/23/20  Yes Martin, [name], FNP  Multiple Vitamins-Minerals (CENTRUM SILVER PO) Take 1 tablet by mouth every morning.   Yes [provider]  omeprazole (PRILOSEC) 40 MG capsule TAKE 1 CAPSULE (40 MG TOTAL) BY MOUTH DAILY. 09/09/22  Yes Martin, [name], FNP  Riboflavin (VITAMIN B-2 PO) Take 100 mg by mouth every morning.   Yes [provider]      Allergies    Crestor [rosuvastatin], Macrodantin, Morphine, Sulfa antibiotics, and Codeine    Review of Systems   Review of Systems  Physical Exam Updated Vital Signs Ht '5\' 8"'$  (1.727 m)   Wt 81.6 kg   BMI 27.37 kg/m  Physical Exam Vitals and nursing note reviewed.  Constitutional:      General: She is not in acute distress.    Appearance: She is well-developed.  HENT:     Head: Normocephalic and atraumatic.     Mouth/Throat:     Mouth: Mucous membranes are moist.  Eyes:     General: Vision grossly intact. Gaze aligned appropriately.     Extraocular Movements: Extraocular movements intact.     Conjunctiva/sclera: Conjunctivae normal.  Cardiovascular:     Rate and Rhythm: Normal rate and regular rhythm.  Pulses: Normal pulses.     Heart sounds: Normal heart sounds, S1 normal and S2 normal. No murmur heard.    No friction rub. No gallop.  Pulmonary:     Effort: Pulmonary effort is normal. No respiratory distress.     Breath sounds: Normal breath sounds.  Abdominal:     General: Bowel sounds are normal.     Palpations: Abdomen is soft.     Tenderness: There is abdominal tenderness in the right upper quadrant and epigastric area. There is no guarding or rebound.     Hernia: No hernia is present.  Musculoskeletal:        General: No swelling.     Cervical back: Full  passive range of motion without pain, normal range of motion and neck supple. No spinous process tenderness or muscular tenderness. Normal range of motion.     Right lower leg: No edema.     Left lower leg: No edema.  Skin:    General: Skin is warm and dry.     Capillary Refill: Capillary refill takes less than 2 seconds.     Findings: No ecchymosis, erythema, rash or wound.  Neurological:     General: No focal deficit present.     Mental Status: She is alert and oriented to person, place, and time.     GCS: GCS eye subscore is 4. GCS verbal subscore is 5. GCS motor subscore is 6.     Cranial Nerves: Cranial nerves 2-12 are intact.     Sensory: Sensation is intact.     Motor: Motor function is intact.     Coordination: Coordination is intact.  Psychiatric:        Attention and Perception: Attention normal.        Mood and Affect: Mood normal.        Speech: Speech normal.        Behavior: Behavior normal.     ED Results / Procedures / Treatments   Labs (all labs ordered are listed, but only abnormal results are displayed) Labs Reviewed  LIPASE, BLOOD  COMPREHENSIVE METABOLIC PANEL  CBC  URINALYSIS, ROUTINE W REFLEX MICROSCOPIC  TROPONIN I (HIGH SENSITIVITY)    EKG None  Radiology No results found.  Procedures Procedures  {Document cardiac monitor, telemetry assessment procedure when appropriate:1}  Medications Ordered in ED Medications  ondansetron (ZOFRAN-ODT) disintegrating tablet 4 mg (4 mg Oral Given 10/06/22 0540)    ED Course/ Medical Decision Making/ A&P   {   Click here for ABCD2, HEART and other calculatorsREFRESH Note before signing :1}                          Medical Decision Making Amount and/or Complexity of Data Reviewed Labs: ordered.  Risk Prescription drug management.   ***  {Document critical care time when appropriate:1} {Document review of labs and clinical decision tools ie heart score, Chads2Vasc2 etc:1}  {Document your  independent review of radiology images, and any outside records:1} {Document your discussion with family members, caretakers, and with consultants:1} {Document social determinants of health affecting pt's care:1} {Document your decision making why or why not admission, treatments were needed:1} Final Clinical Impression(s) / ED Diagnoses Final diagnoses:  None    Rx / DC Orders ED Discharge Orders     None

## 2022-10-06 NOTE — ED Notes (Signed)
Patient received her pre-pack prescription medications. Patient and RN verified count and dosage together. Pt and RN signed paper script which will be placed in its designated place.

## 2022-10-06 NOTE — Discharge Instructions (Addendum)
Workup today consistent with gallstone disease without complications.  Contact general surgery tomorrow for follow-up.  Recommend bland diet.  Take the hydrocodone and Zofran as needed for pain and nausea and vomiting.  If the pain gets to be persistent last 3 hours and does not resolve you need to get seen again if you develop any fever or chills you need to get seen.  Based on the gallbladder being symptomatic from the gallstones that usually means elective surgery to remove the gallbladder.

## 2022-10-06 NOTE — ED Notes (Signed)
Pt transported to restroom to get cleaned up prior to gathering V/S during Triage.

## 2022-10-06 NOTE — ED Triage Notes (Signed)
Pt arrived via POV from home c/o new onset upper abdominal pain accompanied with nausea and emesis. Pt reports she was seen here 3 weeks ago for similar occurrence.

## 2022-10-06 NOTE — ED Provider Notes (Signed)
CT angios of chest abdomen pelvis showed evidence of gallbladder disease.  Ultrasound ordered to further delineate.  Patient's labs normal other than the white count being elevated at 13,000.  Patient without significant tenderness to palpation in the right upper quadrant.  But subjectively she still had pain.  Ultrasound confirmed cholelithiasis with mild inflammation of the gallbladder.  Nothing consistent with acute cholecystitis.  Common bile duct diameter was 0.5 cm.  Patient's liver function test without significant abnormalities.  Will recheck with patient to see how she is feeling.  May be able to be discharged and contact general surgery for elective gallbladder removal.  Patient does state that the about a month ago had something similar however they did not check to see if it was gallbladder.  So sounds as if she is having attacks of biliary colic.  Patient still with some mild subjective pain.  But no tenderness to palpation in the right upper quadrant.  Will discharge patient home follow-up with general surgery she will call tomorrow for appointment.  Recommending bland diet and will give a prepack of hydrocodone and Zofran ODT.  Patient will return for any recurrent worse pain like she had last night or for any fevers or persistent nausea and vomiting.     Fredia Sorrow, MD 10/06/22 1315

## 2022-10-07 MED FILL — Hydrocodone-Acetaminophen Tab 5-325 MG: ORAL | Qty: 6 | Status: AC

## 2022-10-07 MED FILL — Ondansetron HCl Tab 4 MG: ORAL | Qty: 4 | Status: AC

## 2022-10-14 ENCOUNTER — Telehealth: Payer: Self-pay

## 2022-10-14 NOTE — Telephone Encounter (Signed)
        Patient  visited [facility] on 10/06/2022  for abdominal pain.   Telephone encounter attempt :  1st  A HIPAA compliant voice message was left requesting a return call.  Instructed patient to call back at [phone].   [facility] Guide   [email or web address]  ?? [phone]   [email or web address]: [email or web address]  [email or web address]

## 2022-10-17 ENCOUNTER — Ambulatory Visit: Payer: Medicare PPO | Admitting: General Surgery

## 2022-10-17 ENCOUNTER — Encounter: Payer: Self-pay | Admitting: General Surgery

## 2022-10-17 ENCOUNTER — Telehealth: Payer: Self-pay

## 2022-10-17 VITALS — BP 137/76 | HR 80 | Temp 97.8°F | Resp 16 | Ht 68.0 in | Wt 173.0 lb

## 2022-10-17 DIAGNOSIS — K8020 Calculus of gallbladder without cholecystitis without obstruction: Secondary | ICD-10-CM

## 2022-10-17 NOTE — Progress Notes (Signed)
[facility] History and Physical  Reason for Referral: Gallstones Referring Physician: ED   Chief Complaint   New Patient (Initial Visit)     Peggy Smith is a 77 y.o. female.  HPI: Peggy Smith is a 77 yo who comes today with her daughter in law. She has been having RUQ/ epigastric pain for the past 3 weeks and has had 3 episodes.  Two of these episodes caused her to go to the ED.  The first time she was actually worked up for chest pain due to the epigastric pain, but this was all negative.  She has continued to have epigastric/ RUQ pain and was found to have gallstones on repeat imaging and normal liver test. She continues to have pain especially at night around midnight but she reports that she eats late at night.   Past Medical History:  Diagnosis Date   Angina    STRESS TEST NORMAL-ANXIETY   Anxiety    Cardiac arrhythmia due to congenital heart disease    Depression    GERD (gastroesophageal reflux disease)    Headache(784.0)    OTC MEDS   Hepatitis     1976   Hyperlipidemia    DOES NOT TAKE MEDS - HAS THEM AT HOME BUT STATES DOES NOT TAKE   Hypertension    Hypothyroidism    Iron deficiency anemia    Melanoma ([place])    Panic attacks    PONV (postoperative nausea and vomiting)     Past Surgical History:  Procedure Laterality Date   ABDOMINAL HYSTERECTOMY     COLONOSCOPY     CYSTOSCOPY  11/08/2011   Procedure: CYSTOSCOPY;  Surgeon: Delice Lesch, MD;  Location: [facility];  Service: Gynecology;  Laterality: N/A;   DILATION AND CURETTAGE OF UTERUS     LAPAROSCOPIC HYSTERECTOMY  11/08/2011   Procedure: HYSTERECTOMY TOTAL LAPAROSCOPIC;  Surgeon: Delice Lesch, MD;  Location: [facility];  Service: Gynecology;  Laterality: N/A;  with Bilateral Salpingo-Oophorectomies   MELANOMA EXCISION     Left Shoulder   SVD      X 4   TUBAL LIGATION     UPPER GASTROINTESTINAL ENDOSCOPY      Family History  Problem Relation Age of Onset   Dementia Mother     Brain cancer Father    Anxiety disorder Brother    Asthma Son    Colon cancer Neg Hx    Breast cancer Neg Hx     Social History   Tobacco Use   Smoking status: Every Day    Packs/day: 0.25    Years: 30.00    Additional pack years: 0.00    Total pack years: 7.50    Types: Cigarettes   Smokeless tobacco: Never  Vaping Use   Vaping Use: Never used  Substance Use Topics   Alcohol use: No   Drug use: No    Medications: I have reviewed the patient's current medications. Allergies as of 10/17/2022       Reactions   Crestor [rosuvastatin]    Muscle aches   Macrodantin Other (See Comments)   Liver shut down   Morphine Nausea And Vomiting   Sulfa Antibiotics Other (See Comments)   agitation   Codeine Nausea And Vomiting            Medication List        Accurate as of October 17, 2022 11:59 PM. If you have any questions, ask your nurse or doctor.  amLODipine 5 MG tablet Commonly known as: NORVASC TAKE 1 TABLET BY MOUTH EVERY DAY   aspirin 81 MG tablet Take 81 mg by mouth every morning.   atorvastatin 40 MG tablet Commonly known as: LIPITOR Take 1 tablet (40 mg total) by mouth 2 (two) times a week. Take Mon, Thur   calcium carbonate 600 MG Tabs tablet Commonly known as: OS-CAL Take 600 mg by mouth every evening.   CENTRUM SILVER PO Take 1 tablet by mouth every morning.   clonazePAM 0.5 MG tablet Commonly known as: KLONOPIN Take 1 tablet (0.5 mg total) by mouth 2 (two) times daily as needed for anxiety.   escitalopram 20 MG tablet Commonly known as: LEXAPRO TAKE 1 TABLET BY MOUTH EVERYDAY AT BEDTIME   fluticasone 50 MCG/ACT nasal spray Commonly known as: FLONASE SPRAY 2 SPRAYS INTO EACH NOSTRIL EVERY DAY   HYDROcodone-acetaminophen 5-325 MG tablet Commonly known as: NORCO/VICODIN Take 1 tablet by mouth every 6 (six) hours as needed.   levothyroxine 88 MCG tablet Commonly known as: Synthroid Take 1 tablet (88 mcg total) by mouth  daily.   magnesium 30 MG tablet Take 1 tablet (30 mg total) by mouth daily.   Nexlizet 180-10 MG Tabs Generic drug: Bempedoic Acid-Ezetimibe Take 1 tablet by mouth daily.   omeprazole 40 MG capsule Commonly known as: PRILOSEC TAKE 1 CAPSULE (40 MG TOTAL) BY MOUTH DAILY.   ondansetron 4 MG tablet Commonly known as: ZOFRAN Take 1 tablet (4 mg total) by mouth every 8 (eight) hours as needed for nausea or vomiting.   VITAMIN B-2 PO Take 100 mg by mouth every morning.   Vitamin D-3 125 MCG (5000 UT) Tabs Take 5,000 Units by mouth every evening.         ROS:  A comprehensive review of systems was negative except for: Gastrointestinal: positive for abdominal pain, nausea, and reflux symptoms  Blood pressure 137/76, pulse 80, temperature 97.8 F (36.6 C), temperature source Oral, resp. rate 16, height 5\' 8"  (1.727 m), weight 173 lb (78.5 kg), SpO2 95 %. Physical Exam Vitals reviewed.  HENT:     Head: Normocephalic.     Nose: Nose normal.  Eyes:     Extraocular Movements: Extraocular movements intact.  Cardiovascular:     Rate and Rhythm: Normal rate and regular rhythm.  Pulmonary:     Effort: Pulmonary effort is normal.     Breath sounds: Normal breath sounds.  Abdominal:     General: There is no distension.     Palpations: Abdomen is soft.     Tenderness: There is abdominal tenderness in the right upper quadrant and epigastric area.     Hernia: No hernia is present.  Musculoskeletal:     Cervical back: Normal range of motion.  Skin:    General: Skin is warm.  Neurological:     General: No focal deficit present.     Mental Status: She is alert and oriented to person, place, and time.  Psychiatric:        Mood and Affect: Mood normal.        Behavior: Behavior normal.        Thought Content: Thought content normal.        Judgment: Judgment normal.     Results: Personally reviewed- large stone in the neck, mild thickening  CLINICAL DATA:  [id number] Cholelithiasis  [id number]   EXAM: ULTRASOUND ABDOMEN LIMITED RIGHT UPPER QUADRANT   COMPARISON:  CTA CAP, earlier same day.   FINDINGS: Gallbladder:  Two (2) gallstones are demonstrated at the gallbladder neck measuring 1.9 cm and dependent within body measuring 0.6 cm. No gallbladder wall thickening or pericholecystic fluid. The gallbladder is mildly distended.   A sonographic Percell Miller sign can not be assessed by the sonographer given recently administered pain medication.   Common bile duct:   Diameter: 0.5 cm   Liver:   No focal lesion identified. Mildly increased hepatic parenchymal echogenicity. Portal vein is patent on color Doppler imaging with normal direction of blood flow towards the liver.   Other: No perihepatic fluid.   IMPRESSION: 1. Cholelithiasis and mild gallbladder distention. No discrete sonographic findings to suggest acute cholecystitis. 2. Echogenic liver. Findings most commonly seen in hepatic steatosis, though may also represent hepatitis and/or fibrosis.     Electronically Signed   By: Michaelle Birks M.D.   On: 10/06/2022 12:27   CLINICAL DATA:  Acute aortic syndrome (AAS) suspected   EXAM: CT ANGIOGRAPHY CHEST, ABDOMEN AND PELVIS   TECHNIQUE: Non-contrast CT of the chest was initially obtained.   Multidetector CT imaging through the chest, abdomen and pelvis was performed using the standard protocol during bolus administration of intravenous contrast. Multiplanar reconstructed images and MIPs were obtained and reviewed to evaluate the vascular anatomy.   RADIATION DOSE REDUCTION: This exam was performed according to the departmental dose-optimization program which includes automated exposure control, adjustment of the mA and/or kV according to patient size and/or use of iterative reconstruction technique.   CONTRAST:  149mL OMNIPAQUE IOHEXOL 350 MG/ML SOLN   COMPARISON:  Chest XR, concurrent. CTA chest 02/20/2019. Pelvis and hip XRs, 10/12/2018.    FINDINGS: Suboptimal evaluation, secondary to motion degradation.   CTA CHEST FINDINGS   Cardiovascular:   *Preferential opacification of the thoracic aorta. No evidence of thoracic aortic aneurysm or dissection. *3.8 cm ascending thoracic aorta. Conventional LEFT aortic arch with 4- vessel variant, and arch origin of the LEFT vertebral artery. *Calcified atherosclerosis of the arch, greatest at the proximal LEFT common carotid artery. No hemodynamic stenosis at the origins of the great vessels. *Normal heart size. No pericardial effusion. * No segmental or larger pulmonary embolus.   Mediastinum/Nodes: No enlarged mediastinal, hilar, or axillary lymph nodes. Thyroid gland, trachea, and esophagus demonstrate no significant findings.   Lungs/Pleura: Calcified granulomas, within the RIGHT upper lobe. Lungs are clear without focal consolidation, mass or suspicious pulmonary nodule. No pleural effusion or pneumothorax.   Musculoskeletal: No acute chest wall abnormality.   Review of the MIP images confirms the above findings.   CTA ABDOMEN AND PELVIS FINDINGS   VASCULAR   Aorta: Normal caliber aorta without aneurysm, dissection, vasculitis or significant stenosis.   Mild-to-moderate burden of distal aortic and bi iliac calcified and noncalcified atherosclerosis without aneurysmal dilatation   Celiac: Ostial atherosclerosis, otherwise patent without evidence of aneurysm, dissection, vasculitis or significant stenosis.   SMA: Patent without evidence of aneurysm, dissection, vasculitis or significant stenosis.   Renals: Single renal arteries are present bilaterally. Both renal arteries are patent without evidence of aneurysm, dissection, vasculitis, fibromuscular dysplasia or significant stenosis.   IMA: Ostial atherosclerosis, otherwise patent without evidence of aneurysm, dissection, vasculitis or significant stenosis.   Pelvis: Patent without evidence of aneurysm,  dissection, vasculitis or significant stenosis.   Veins: No obvious venous abnormality within the limitations of this arterial phase study.   Review of the MIP images confirms the above findings.   NON-VASCULAR   Hepatobiliary:   *Normal volume liver with smooth contour. 1 cm enhancing focus  at the subcapsular LEFT hepatic lobe without discrete mass, likely a THAD. *1.8 cm peripherally-calcified gallstone at the gallbladder neck, with mild gallbladder distention. *No pericholecystic stranding, gallbladder wall thickening, or biliary dilatation.   Pancreas: No pancreatic ductal dilatation or surrounding inflammatory changes.   Spleen: Normal in size without focal abnormality.   Adrenals/Urinary Tract: Nodular thickening of the LEFT adrenal gland, measuring 1.3 x 2.5 x 2.8 cm (AP by transaxial by CC). The lesion measures 3 HU on noncontrast imaging, no calcification. The RIGHT adrenal gland is unremarkable.   Kidneys are normal, without renal calculi, focal lesion, or hydronephrosis. Bladder is unremarkable.   Stomach/Bowel: Stomach is within normal limits. Appendix is not definitively visualized. Nonobstructed small bowel. Nondilated colon. Moderate-to-moderate burden of sigmoid diverticulosis. No evidence of bowel wall thickening, distention, or inflammatory changes.   Lymphatic: No enlarged abdominal or pelvic lymph nodes.   Reproductive: Status post hysterectomy. No adnexal mass.   Other: No abdominal wall hernia or abnormality. No abdominopelvic ascites.   Musculoskeletal: Asymmetric sclerosis and cortical thickening of the RIGHT hemipelvis. Scattered degenerative changes of the spine, greatest within the lower lumbar spine at L4-5. No acute osseous findings.   Review of the MIP images confirms the above findings.   IMPRESSION: 1. No acute vascular abnormality within the chest, abdomen or pelvis. 2. No acute intrathoracic or abdominopelvic process. 3. 1.8  cm calcified gallstone with mild gallbladder distention. No additional CT findings to suggest acute cholecystitis. Given the absence of additional symptoms, this may contribute to the patient's reported discomfort. If continued clinical concern, consider a [place] RUQ for further evaluation. 4. 2.8 cm LEFT adrenal mass, likely consistent with benign adenoma. No follow-up imaging is recommended. JACR 2017 Aug; 14(8):1038-44, JCAT 2016 Mar-Apr; 40(2):194-200, Urol J 2006 Spring; 3(2):71-4. 5. Aortic Atherosclerosis (ICD10-I70.0). Asymmetric sclerosis of the RIGHT hemipelvis, consistent with Paget disease of bone. Additional incidental, chronic and senescent findings as above.   Michaelle Birks, MD   [facility]   [facility]     Electronically Signed   By: Michaelle Birks M.D.   On: 10/06/2022 08:27  Latest Reference Range & Units 10/06/22 05:41  Sodium 135 - 145 mmol/L 135  Potassium 3.5 - 5.1 mmol/L 3.6  Chloride 98 - 111 mmol/L 100  CO2 22 - 32 mmol/L 25  Glucose 70 - 99 mg/dL 150 (H)  BUN 8 - 23 mg/dL 22  Creatinine 0.44 - 1.00 mg/dL 0.93  Calcium 8.9 - 10.3 mg/dL 9.0  Anion gap 5 - 15  10  Alkaline Phosphatase 38 - 126 U/L 83  Albumin 3.5 - 5.0 g/dL 4.1  Lipase 11 - 51 U/L 44  AST 15 - 41 U/L 28  ALT 0 - 44 U/L 14  Total Protein 6.5 - 8.1 g/dL 7.5  Total Bilirubin 0.3 - 1.2 mg/dL 0.4  GFR, Estimated >60 mL/min >60  (H): Data is abnormally high   Latest Reference Range & Units 10/06/22 05:41  WBC 4.0 - 10.5 K/uL 13.9 (H)  RBC 3.87 - 5.11 MIL/uL 4.49  Hemoglobin 12.0 - 15.0 g/dL 11.7 (L)  HCT 36.0 - 46.0 % 36.7  MCV 80.0 - 100.0 fL 81.7  MCH 26.0 - 34.0 pg 26.1  MCHC 30.0 - 36.0 g/dL 31.9  RDW 11.5 - 15.5 % 15.9 (H)  Platelets 150 - 400 K/uL 500 (H)  nRBC 0.0 - 0.2 % 0.0  (H): Data is abnormally high (L): Data is abnormally low  Assessment & Plan:  Peggy Smith is a 77 y.o. female with what sounds like symptomatic  cholelithiasis especially given the stone in the neck. She remains tender. Also discussed that this could be from gastritis or ulcer but less likely since the pain is mostly after food in the evening.   PLAN: I counseled the patient about the indication, risks and benefits of robotic assisted laparoscopic cholecystectomy.  She understands there is a very small chance for bleeding, infection, injury to normal structures (including common bile duct), conversion to open surgery, persistent symptoms, evolution of postcholecystectomy diarrhea, need for secondary interventions, anesthesia reaction, cardiopulmonary issues and other risks not specifically detailed here. I described the expected recovery, the plan for follow-up and the restrictions during the recovery phase.  All questions were answered.  All questions were answered to the satisfaction of the patient and family. They want to think about the options.      Virl Cagey 10/18/2022, 4:21 PM

## 2022-10-17 NOTE — Patient Instructions (Signed)
Let us know about surgery and when you decide to have it done.

## 2022-10-17 NOTE — Telephone Encounter (Signed)
        Patient  visited [facility] on 10/06/2022  for abdominal pain.   Telephone encounter attempt :  2nd  A HIPAA compliant voice message was left requesting a return call.  Instructed patient to call back at [phone].   [facility] Care Guide   [email or web address]  ?? [phone]   Website: [email or web address]  [email or web address]

## 2022-10-21 ENCOUNTER — Ambulatory Visit: Payer: Medicare PPO | Admitting: Nurse Practitioner

## 2022-10-21 ENCOUNTER — Encounter: Payer: Self-pay | Admitting: Nurse Practitioner

## 2022-10-21 VITALS — BP 116/76 | HR 76 | Temp 97.4°F | Resp 20 | Ht 69.6 in | Wt 171.4 lb

## 2022-10-21 DIAGNOSIS — I2510 Atherosclerotic heart disease of native coronary artery without angina pectoris: Secondary | ICD-10-CM

## 2022-10-21 DIAGNOSIS — E039 Hypothyroidism, unspecified: Secondary | ICD-10-CM | POA: Diagnosis not present

## 2022-10-21 DIAGNOSIS — K219 Gastro-esophageal reflux disease without esophagitis: Secondary | ICD-10-CM

## 2022-10-21 DIAGNOSIS — Z0001 Encounter for general adult medical examination with abnormal findings: Secondary | ICD-10-CM

## 2022-10-21 DIAGNOSIS — E782 Mixed hyperlipidemia: Secondary | ICD-10-CM | POA: Diagnosis not present

## 2022-10-21 DIAGNOSIS — F411 Generalized anxiety disorder: Secondary | ICD-10-CM

## 2022-10-21 DIAGNOSIS — F410 Panic disorder [episodic paroxysmal anxiety] without agoraphobia: Secondary | ICD-10-CM | POA: Diagnosis not present

## 2022-10-21 DIAGNOSIS — D509 Iron deficiency anemia, unspecified: Secondary | ICD-10-CM

## 2022-10-21 DIAGNOSIS — Z0000 Encounter for general adult medical examination without abnormal findings: Secondary | ICD-10-CM

## 2022-10-21 DIAGNOSIS — Z720 Tobacco use: Secondary | ICD-10-CM

## 2022-10-21 DIAGNOSIS — I10 Essential (primary) hypertension: Secondary | ICD-10-CM | POA: Diagnosis not present

## 2022-10-21 MED ORDER — LEVOTHYROXINE SODIUM 88 MCG PO TABS: 88.0000 ug | ORAL_TABLET | Freq: Every day | ORAL | 1 refills | Status: DC

## 2022-10-21 MED ORDER — AMLODIPINE BESYLATE 5 MG PO TABS: ORAL_TABLET | ORAL | 1 refills | Status: DC

## 2022-10-21 MED ORDER — ATORVASTATIN CALCIUM 40 MG PO TABS: 40.0000 mg | ORAL_TABLET | ORAL | 1 refills | Status: DC

## 2022-10-21 MED ORDER — OMEPRAZOLE 40 MG PO CPDR: 40.0000 mg | DELAYED_RELEASE_CAPSULE | Freq: Every day | ORAL | 1 refills | Status: DC

## 2022-10-21 MED ORDER — CLONAZEPAM 0.5 MG PO TABS: 0.5000 mg | ORAL_TABLET | Freq: Two times a day (BID) | ORAL | 5 refills | Status: DC | PRN

## 2022-10-21 MED ORDER — ESCITALOPRAM OXALATE 20 MG PO TABS: ORAL_TABLET | ORAL | 1 refills | Status: DC

## 2022-10-21 NOTE — Patient Instructions (Signed)
Our records indicate that you are due for your screening mammogram.  Please call the imaging center that does your yearly mammograms to make an appointment for a mammogram at your earliest convenience. Our office also has a mobile unit through the [facility] that comes to our location. Please call our office if you would like to make an appointment.   

## 2022-10-21 NOTE — Progress Notes (Signed)
Subjective:    Patient ID: Peggy Smith, female    DOB: September 09, 1945, 77 y.o.   MRN: [id number]   Chief Complaint: annual physical   HPI:  Peggy Smith is a 77 y.o. who identifies as a female who was assigned female at birth.   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 Annual physical exam No pap  2. Primary hypertension No c/o chest pain, sob or headache, doe snot check blood pressure at home. BP Readings from Last 3 Encounters:  10/17/22 137/76  10/06/22 (!) 154/85  09/14/22 (!) 148/79     3. Coronary artery disease involving native coronary artery of native heart without angina pectoris Last had face to face visit with cardiology on 12/17/21. No changes were made to plan of care and she is to follow up in May 2024.  4. Mixed hyperlipidemia Does try to watch diet but does no exercise. She tries to stay active though.  5. Acquired hypothyroidism No problems that she is aware of. . Lab Results  Component Value Date   TSH 0.657 04/22/2022     6. Gastroesophageal reflux disease without esophagitis Is on omperazole daily and works well to keep symptoms under control  7. Iron deficiency anemia, unspecified iron deficiency anemia type No c/o fatigue Lab Results  Component Value Date   HGB 11.7 (L) 10/06/2022     8. Anxiety state 9. Panic attacks Has always been very anxious. Has occasional panic attacks. Is on lexapro daily.  10. Tobacco abuse Still smokes 1/2 -1 pack a day. She had a chest CT on 10/06/22 which showed no cancer  11. BMI 29.0-29.9,adult Wt Readings from Last 3 Encounters:  10/21/22 171 lb 6.4 oz (77.7 kg)  10/17/22 173 lb (78.5 kg)  10/06/22 180 lb (81.6 kg)   BMI Readings from Last 3 Encounters:  10/21/22 24.88 kg/m  10/17/22 26.30 kg/m  10/06/22 27.37 kg/m      New complaints: She  went to the ED on 09/14/22 with chest pain. Ended up being her gall bladder. She is scheduled for gall bladder removal 11/11/22.  Allergies  Allergen Reactions   Crestor [Rosuvastatin]     Muscle aches   Macrodantin Other (See Comments)    Liver shut down   Morphine Nausea And Vomiting   Sulfa Antibiotics Other (See Comments)    agitation   Codeine Nausea And Vomiting        Outpatient Encounter Medications as of 10/21/2022  Medication Sig   amLODipine (NORVASC) 5 MG tablet TAKE 1 TABLET BY MOUTH EVERY DAY   aspirin 81 MG tablet Take 81 mg by mouth every morning.   atorvastatin (LIPITOR) 40 MG tablet Take 1 tablet (40 mg total) by mouth 2 (two) times a week. Take Mon, Thur   Bempedoic Acid-Ezetimibe (NEXLIZET) 180-10 MG TABS Take 1 tablet by mouth daily.   calcium carbonate (OS-CAL) 600 MG TABS Take 600 mg by mouth every evening.   Cholecalciferol (VITAMIN D-3) 5000 UNITS TABS Take 5,000 Units by mouth every evening.   clonazePAM (KLONOPIN) 0.5 MG tablet Take 1 tablet (0.5 mg total) by mouth 2 (two) times daily as needed for anxiety.   escitalopram (LEXAPRO) 20 MG tablet TAKE 1 TABLET BY MOUTH EVERYDAY AT BEDTIME   fluticasone (FLONASE) 50 MCG/ACT nasal spray SPRAY 2 SPRAYS INTO EACH NOSTRIL EVERY DAY  HYDROcodone-acetaminophen (NORCO/VICODIN) 5-325 MG tablet Take 1 tablet by mouth every 6 (six) hours as needed.   levothyroxine (SYNTHROID) 88 MCG tablet Take 1 tablet (88 mcg total) by mouth daily.   magnesium 30 MG tablet Take 1 tablet (30 mg total) by mouth daily.   Multiple Vitamins-Minerals (CENTRUM SILVER PO) Take 1 tablet by mouth every morning.   omeprazole (PRILOSEC) 40 MG capsule TAKE 1 CAPSULE (40 MG TOTAL) BY MOUTH DAILY.   ondansetron (ZOFRAN) 4 MG tablet Take 1 tablet (4 mg total) by mouth every 8 (eight) hours as needed for nausea or vomiting.   Riboflavin (VITAMIN B-2 PO) Take 100 mg by mouth every morning.   No facility-administered encounter medications on file as of  10/21/2022.    Past Surgical History:  Procedure Laterality Date   ABDOMINAL HYSTERECTOMY     COLONOSCOPY     CYSTOSCOPY  11/08/2011   Procedure: CYSTOSCOPY;  Surgeon: Delice Lesch, MD;  Location: [facility];  Service: Gynecology;  Laterality: N/A;   DILATION AND CURETTAGE OF UTERUS     LAPAROSCOPIC HYSTERECTOMY  11/08/2011   Procedure: HYSTERECTOMY TOTAL LAPAROSCOPIC;  Surgeon: Delice Lesch, MD;  Location: [facility];  Service: Gynecology;  Laterality: N/A;  with Bilateral Salpingo-Oophorectomies   MELANOMA EXCISION     Left Shoulder   SVD      X 4   TUBAL LIGATION     UPPER GASTROINTESTINAL ENDOSCOPY     / Family History  Problem Relation Age of Onset   Dementia Mother    Brain cancer Father    Anxiety disorder Brother    Asthma Son    Colon cancer Neg Hx    Breast cancer Neg Hx       Controlled substance contract: n/a     Review of Systems  Constitutional:  Negative for diaphoresis.  Eyes:  Negative for pain.  Respiratory:  Negative for shortness of breath.   Cardiovascular:  Positive for chest pain. Negative for palpitations and leg swelling.  Gastrointestinal:  Positive for nausea and vomiting. Negative for abdominal pain.  Endocrine: Negative for polydipsia.  Skin:  Negative for rash.  Neurological:  Negative for dizziness, weakness and headaches.  Hematological:  Does not bruise/bleed easily.  All other systems reviewed and are negative.      Objective:   Physical Exam Vitals and nursing note reviewed.  Constitutional:      General: She is not in acute distress.    Appearance: Normal appearance. She is well-developed.  HENT:     Head: Normocephalic.     Right Ear: Tympanic membrane normal.     Left Ear: Tympanic membrane normal.     Nose: Nose normal.     Mouth/Throat:     Mouth: Mucous membranes are moist.  Eyes:     Pupils: Pupils are equal, round, and reactive to light.  Neck:     Vascular: No carotid bruit or JVD.  Cardiovascular:      Rate and Rhythm: Normal rate and regular rhythm.     Heart sounds: Normal heart sounds.  Pulmonary:     Effort: Pulmonary effort is normal. No respiratory distress.     Breath sounds: Normal breath sounds. No wheezing or rales.  Chest:     Chest wall: No tenderness.  Abdominal:     General: Bowel sounds are normal. There is no distension or abdominal bruit.     Palpations: Abdomen is soft. There is no hepatomegaly, splenomegaly, mass or pulsatile mass.  Tenderness: There is abdominal tenderness. Positive signs include Murphy's sign.  Musculoskeletal:        General: Normal range of motion.     Cervical back: Normal range of motion and neck supple.  Lymphadenopathy:     Cervical: No cervical adenopathy.  Skin:    General: Skin is warm and dry.  Neurological:     Mental Status: She is alert and oriented to person, place, and time.     Deep Tendon Reflexes: Reflexes are normal and symmetric.  Psychiatric:        Behavior: Behavior normal.        Thought Content: Thought content normal.        Judgment: Judgment normal.     BP 116/76   Pulse 76   Temp (!) 97.4 F (36.3 C) (Temporal)   Resp 20   Ht 5' 9.6" (1.768 m)   Wt 171 lb 6.4 oz (77.7 kg)   SpO2 94%   BMI 24.88 kg/m        Assessment & Plan:  Peggy Smith comes in today with chief complaint of Medical Management of Chronic Issues   Diagnosis and orders addressed:  1. Annual physical exam   2. Primary hypertension Low sodium diet - amLODipine (NORVASC) 5 MG tablet; TAKE 1 TABLET BY MOUTH EVERY DAY  Dispense: 90 tablet; Refill: 1 - CBC with Differential/Platelet - CMP14+EGFR  3. Coronary artery disease involving native coronary artery of native heart without angina pectoris Keep yearly follow up with cardiology  4. Mixed hyperlipidemia Low fat diet - atorvastatin (LIPITOR) 40 MG tablet; Take 1 tablet (40 mg total) by mouth 2 (two) times a week. Take Rolene Course  Dispense: 90 tablet; Refill: 1 -  Lipid panel  5. Acquired hypothyroidism Labs pending - levothyroxine (SYNTHROID) 88 MCG tablet; Take 1 tablet (88 mcg total) by mouth daily.  Dispense: 90 tablet; Refill: 1 - Thyroid Panel With TSH  6. Gastroesophageal reflux disease without esophagitis Avoid spicy foods Do not eat 2 hours prior to bedtime  - omeprazole (PRILOSEC) 40 MG capsule; Take 1 capsule (40 mg total) by mouth daily.  Dispense: 90 capsule; Refill: 1  7. Iron deficiency anemia, unspecified iron deficiency anemia type Labs pending  8. Anxiety state Stress management - clonazePAM (KLONOPIN) 0.5 MG tablet; Take 1 tablet (0.5 mg total) by mouth 2 (two) times daily as needed for anxiety.  Dispense: 60 tablet; Refill: 5  9. Panic attacks - escitalopram (LEXAPRO) 20 MG tablet; TAKE 1 TABLET BY MOUTH EVERYDAY AT BEDTIME  Dispense: 90 tablet; Refill: 1  10. Tobacco abuse Smoking cessation encouraged   Labs pending Health Maintenance reviewed Diet and exercise encouraged  Follow up plan: 6 months   [name] Hassell Done, FNP

## 2022-10-24 DIAGNOSIS — K810 Acute cholecystitis: Secondary | ICD-10-CM | POA: Insufficient documentation

## 2022-10-24 DIAGNOSIS — K8020 Calculus of gallbladder without cholecystitis without obstruction: Secondary | ICD-10-CM

## 2022-10-24 NOTE — H&P (Signed)
[facility] History and Physical   Reason for Referral: Gallstones Referring Physician: ED    Chief Complaint   New Patient (Initial Visit)        Peggy Smith is a 77 y.o. female.  HPI: Peggy Smith is a 77 yo who comes today with her daughter in law. She has been having RUQ/ epigastric pain for the past 3 weeks and has had 3 episodes.  Two of these episodes caused her to go to the ED.  The first time she was actually worked up for chest pain due to the epigastric pain, but this was all negative.  She has continued to have epigastric/ RUQ pain and was found to have gallstones on repeat imaging and normal liver test. She continues to have pain especially at night around midnight but she reports that she eats late at night.        Past Medical History:  Diagnosis Date   Angina      STRESS TEST NORMAL-ANXIETY   Anxiety     Cardiac arrhythmia due to congenital heart disease     Depression     GERD (gastroesophageal reflux disease)     Headache(784.0)      OTC MEDS   Hepatitis      1976   Hyperlipidemia      DOES NOT TAKE MEDS - HAS THEM AT HOME BUT STATES DOES NOT TAKE   Hypertension     Hypothyroidism     Iron deficiency anemia     Melanoma ([place])     Panic attacks     PONV (postoperative nausea and vomiting)             Past Surgical History:  Procedure Laterality Date   ABDOMINAL HYSTERECTOMY       COLONOSCOPY       CYSTOSCOPY   11/08/2011    Procedure: CYSTOSCOPY;  Surgeon: Delice Lesch, MD;  Location: [facility];  Service: Gynecology;  Laterality: N/A;   DILATION AND CURETTAGE OF UTERUS       LAPAROSCOPIC HYSTERECTOMY   11/08/2011    Procedure: HYSTERECTOMY TOTAL LAPAROSCOPIC;  Surgeon: Delice Lesch, MD;  Location: [facility];  Service: Gynecology;  Laterality: N/A;  with Bilateral Salpingo-Oophorectomies   MELANOMA EXCISION        Left Shoulder   SVD         X 4   TUBAL LIGATION       UPPER GASTROINTESTINAL ENDOSCOPY                Family History  Problem Relation Age of Onset   Dementia Mother     Brain cancer Father     Anxiety disorder Brother     Asthma Son     Colon cancer Neg Hx     Breast cancer Neg Hx        Social History         Tobacco Use   Smoking status: Every Day      Packs/day: 0.25      Years: 30.00      Additional pack years: 0.00      Total pack years: 7.50      Types: Cigarettes   Smokeless tobacco: Never  Vaping Use   Vaping Use: Never used  Substance Use Topics   Alcohol use: No   Drug use: No      Medications: I have reviewed the patient's current medications. Allergies as of 10/17/2022  Reactions    Crestor [rosuvastatin]      Muscle aches    Macrodantin Other (See Comments)    Liver shut down    Morphine Nausea And Vomiting    Sulfa Antibiotics Other (See Comments)    agitation    Codeine Nausea And Vomiting                 Medication List           Accurate as of October 17, 2022 11:59 PM. If you have any questions, ask your nurse or doctor.              amLODipine 5 MG tablet Commonly known as: NORVASC TAKE 1 TABLET BY MOUTH EVERY DAY    aspirin 81 MG tablet Take 81 mg by mouth every morning.    atorvastatin 40 MG tablet Commonly known as: LIPITOR Take 1 tablet (40 mg total) by mouth 2 (two) times a week. Take Mon, Thur    calcium carbonate 600 MG Tabs tablet Commonly known as: OS-CAL Take 600 mg by mouth every evening.    CENTRUM SILVER PO Take 1 tablet by mouth every morning.    clonazePAM 0.5 MG tablet Commonly known as: KLONOPIN Take 1 tablet (0.5 mg total) by mouth 2 (two) times daily as needed for anxiety.    escitalopram 20 MG tablet Commonly known as: LEXAPRO TAKE 1 TABLET BY MOUTH EVERYDAY AT BEDTIME    fluticasone 50 MCG/ACT nasal spray Commonly known as: FLONASE SPRAY 2 SPRAYS INTO EACH NOSTRIL EVERY DAY    HYDROcodone-acetaminophen 5-325 MG tablet Commonly known as: NORCO/VICODIN Take 1 tablet by mouth every 6  (six) hours as needed.    levothyroxine 88 MCG tablet Commonly known as: Synthroid Take 1 tablet (88 mcg total) by mouth daily.    magnesium 30 MG tablet Take 1 tablet (30 mg total) by mouth daily.    Nexlizet 180-10 MG Tabs Generic drug: Bempedoic Acid-Ezetimibe Take 1 tablet by mouth daily.    omeprazole 40 MG capsule Commonly known as: PRILOSEC TAKE 1 CAPSULE (40 MG TOTAL) BY MOUTH DAILY.    ondansetron 4 MG tablet Commonly known as: ZOFRAN Take 1 tablet (4 mg total) by mouth every 8 (eight) hours as needed for nausea or vomiting.    VITAMIN B-2 PO Take 100 mg by mouth every morning.    Vitamin D-3 125 MCG (5000 UT) Tabs Take 5,000 Units by mouth every evening.               ROS:  A comprehensive review of systems was negative except for: Gastrointestinal: positive for abdominal pain, nausea, and reflux symptoms   Blood pressure 137/76, pulse 80, temperature 97.8 F (36.6 C), temperature source Oral, resp. rate 16, height 5\' 8"  (1.727 m), weight 173 lb (78.5 kg), SpO2 95 %. Physical Exam Vitals reviewed.  HENT:     Head: Normocephalic.     Nose: Nose normal.  Eyes:     Extraocular Movements: Extraocular movements intact.  Cardiovascular:     Rate and Rhythm: Normal rate and regular rhythm.  Pulmonary:     Effort: Pulmonary effort is normal.     Breath sounds: Normal breath sounds.  Abdominal:     General: There is no distension.     Palpations: Abdomen is soft.     Tenderness: There is abdominal tenderness in the right upper quadrant and epigastric area.     Hernia: No hernia is present.  Musculoskeletal:  Cervical back: Normal range of motion.  Skin:    General: Skin is warm.  Neurological:     General: No focal deficit present.     Mental Status: She is alert and oriented to person, place, and time.  Psychiatric:        Mood and Affect: Mood normal.        Behavior: Behavior normal.        Thought Content: Thought content normal.         Judgment: Judgment normal.        Results: Personally reviewed- large stone in the neck, mild thickening  CLINICAL DATA:  [id number] Cholelithiasis [id number]   EXAM: ULTRASOUND ABDOMEN LIMITED RIGHT UPPER QUADRANT   COMPARISON:  CTA CAP, earlier same day.   FINDINGS: Gallbladder:   Two (2) gallstones are demonstrated at the gallbladder neck measuring 1.9 cm and dependent within body measuring 0.6 cm. No gallbladder wall thickening or pericholecystic fluid. The gallbladder is mildly distended.   A sonographic Percell Miller sign can not be assessed by the sonographer given recently administered pain medication.   Common bile duct:   Diameter: 0.5 cm   Liver:   No focal lesion identified. Mildly increased hepatic parenchymal echogenicity. Portal vein is patent on color Doppler imaging with normal direction of blood flow towards the liver.   Other: No perihepatic fluid.   IMPRESSION: 1. Cholelithiasis and mild gallbladder distention. No discrete sonographic findings to suggest acute cholecystitis. 2. Echogenic liver. Findings most commonly seen in hepatic steatosis, though may also represent hepatitis and/or fibrosis.     Electronically Signed   By: Michaelle Birks M.D.   On: 10/06/2022 12:27   CLINICAL DATA:  Acute aortic syndrome (AAS) suspected   EXAM: CT ANGIOGRAPHY CHEST, ABDOMEN AND PELVIS   TECHNIQUE: Non-contrast CT of the chest was initially obtained.   Multidetector CT imaging through the chest, abdomen and pelvis was performed using the standard protocol during bolus administration of intravenous contrast. Multiplanar reconstructed images and MIPs were obtained and reviewed to evaluate the vascular anatomy.   RADIATION DOSE REDUCTION: This exam was performed according to the departmental dose-optimization program which includes automated exposure control, adjustment of the mA and/or kV according to patient size and/or use of iterative reconstruction technique.    CONTRAST:  124mL OMNIPAQUE IOHEXOL 350 MG/ML SOLN   COMPARISON:  Chest XR, concurrent. CTA chest 02/20/2019. Pelvis and hip XRs, 10/12/2018.   FINDINGS: Suboptimal evaluation, secondary to motion degradation.   CTA CHEST FINDINGS   Cardiovascular:   *Preferential opacification of the thoracic aorta. No evidence of thoracic aortic aneurysm or dissection. *3.8 cm ascending thoracic aorta. Conventional LEFT aortic arch with 4- vessel variant, and arch origin of the LEFT vertebral artery. *Calcified atherosclerosis of the arch, greatest at the proximal LEFT common carotid artery. No hemodynamic stenosis at the origins of the great vessels. *Normal heart size. No pericardial effusion. * No segmental or larger pulmonary embolus.   Mediastinum/Nodes: No enlarged mediastinal, hilar, or axillary lymph nodes. Thyroid gland, trachea, and esophagus demonstrate no significant findings.   Lungs/Pleura: Calcified granulomas, within the RIGHT upper lobe. Lungs are clear without focal consolidation, mass or suspicious pulmonary nodule. No pleural effusion or pneumothorax.   Musculoskeletal: No acute chest wall abnormality.   Review of the MIP images confirms the above findings.   CTA ABDOMEN AND PELVIS FINDINGS   VASCULAR   Aorta: Normal caliber aorta without aneurysm, dissection, vasculitis or significant stenosis.   Mild-to-moderate burden of distal aortic and  at the subcapsular LEFT hepatic lobe without discrete mass, likely a THAD. *1.8 cm peripherally-calcified gallstone at the gallbladder neck, with mild gallbladder distention. *No pericholecystic stranding, gallbladder wall thickening, or biliary dilatation.   Pancreas: No pancreatic ductal dilatation or surrounding inflammatory changes.   Spleen: Normal in size without focal abnormality.   Adrenals/Urinary Tract: Nodular thickening of the LEFT adrenal gland, measuring 1.3 x 2.5 x 2.8 cm (AP by transaxial by CC). The lesion measures 3 HU on noncontrast imaging, no calcification. The RIGHT adrenal gland is unremarkable.   Kidneys are normal, without renal calculi, focal lesion, or hydronephrosis. Bladder is unremarkable.   Stomach/Bowel: Stomach is within normal limits. Appendix is not definitively visualized. Nonobstructed small bowel. Nondilated colon. Moderate-to-moderate burden of sigmoid diverticulosis. No evidence of bowel wall thickening, distention, or inflammatory changes.   Lymphatic: No enlarged abdominal or pelvic lymph nodes.   Reproductive: Status post hysterectomy. No adnexal mass.   Other: No abdominal wall hernia or abnormality. No abdominopelvic ascites.   Musculoskeletal: Asymmetric sclerosis and cortical thickening of the RIGHT hemipelvis. Scattered degenerative changes of the spine, greatest within the lower lumbar spine at L4-5. No acute osseous findings.   Review of the MIP images confirms the above findings.   IMPRESSION: 1. No acute vascular abnormality within the chest, abdomen or pelvis. 2. No acute intrathoracic or abdominopelvic process. 3. 1.8  cm calcified gallstone with mild gallbladder distention. No additional CT findings to suggest acute cholecystitis. Given the absence of additional symptoms, this may contribute to the patient's reported discomfort. If continued clinical concern, consider a US RUQ for further evaluation. 4. 2.8 cm LEFT adrenal mass, likely consistent with benign adenoma. No follow-up imaging is recommended. JACR 2017 Aug; 14(8):1038-44, JCAT 2016 Mar-Apr; 40(2):194-200, Urol J 2006 Spring; 3(2):71-4. 5. Aortic Atherosclerosis (ICD10-I70.0). Asymmetric sclerosis of the RIGHT hemipelvis, consistent with Paget disease of bone. Additional incidental, chronic and senescent findings as above.   Jon Mugweru, MD   [facility]   [facility]     Electronically Signed   By: Jon  Mugweru M.D.   On: 10/06/2022 08:27  Latest Reference Range & Units 10/06/22 05:41  Sodium 135 - 145 mmol/L 135  Potassium 3.5 - 5.1 mmol/L 3.6  Chloride 98 - 111 mmol/L 100  CO2 22 - 32 mmol/L 25  Glucose 70 - 99 mg/dL 150 (H)  BUN 8 - 23 mg/dL 22  Creatinine 0.44 - 1.00 mg/dL 0.93  Calcium 8.9 - 10.3 mg/dL 9.0  Anion gap 5 - 15  10  Alkaline Phosphatase 38 - 126 U/L 83  Albumin 3.5 - 5.0 g/dL 4.1  Lipase 11 - 51 U/L 44  AST 15 - 41 U/L 28  ALT 0 - 44 U/L 14  Total Protein 6.5 - 8.1 g/dL 7.5  Total Bilirubin 0.3 - 1.2 mg/dL 0.4  GFR, Estimated >60 mL/min >60  (H): Data is abnormally high   Latest Reference Range & Units 10/06/22 05:41  WBC 4.0 - 10.5 K/uL 13.9 (H)  RBC 3.87 - 5.11 MIL/uL 4.49  Hemoglobin 12.0 - 15.0 g/dL 11.7 (L)  HCT 36.0 - 46.0 % 36.7  MCV 80.0 - 100.0 fL 81.7  MCH 26.0 - 34.0 pg 26.1  MCHC 30.0 - 36.0 g/dL 31.9  RDW 11.5 - 15.5 % 15.9 (H)  Platelets 150 - 400 K/uL 500 (H)  nRBC 0.0 - 0.2 % 0.0  (H): Data is abnormally high (L): Data is abnormally low  Assessment & Plan:  Anayiah   41 U/L 28  ALT 0 - 44 U/L 14  Total Protein 6.5 - 8.1 g/dL 7.5  Total Bilirubin 0.3 - 1.2 mg/dL 0.4  GFR, Estimated >60 mL/min >60  (H): Data is abnormally high     Latest Reference Range & Units 10/06/22 05:41  WBC 4.0 - 10.5 K/uL 13.9 (H)  RBC 3.87 - 5.11 MIL/uL 4.49  Hemoglobin 12.0 - 15.0 g/dL 11.7 (L)  HCT 36.0 - 46.0 % 36.7  MCV 80.0 - 100.0 fL 81.7  MCH 26.0 - 34.0 pg 26.1  MCHC 30.0 - 36.0 g/dL 31.9  RDW 11.5 - 15.5 % 15.9 (H)  Platelets 150 - 400 K/uL 500 (H)  nRBC 0.0 - 0.2 % 0.0  (H):  Data is abnormally high (L): Data is abnormally low   Assessment & Plan:  NYALAH HANN is a 77 y.o. female with what sounds like symptomatic cholelithiasis especially given the stone in the neck. She remains tender. Also discussed that this could be from gastritis or ulcer but less likely since the pain is mostly after food in the evening.    PLAN: I counseled the patient about the indication, risks and benefits of robotic assisted laparoscopic cholecystectomy.  She understands there is a very small chance for bleeding, infection, injury to normal structures (including common bile duct), conversion to open surgery, persistent symptoms, evolution of postcholecystectomy diarrhea, need for secondary interventions, anesthesia reaction, cardiopulmonary issues and other risks not specifically detailed here. I described the expected recovery, the plan for follow-up and the restrictions during the recovery phase.  All questions were answered.   All questions were answered to the satisfaction of the patient and family. They want to think about the options.          Virl Cagey 10/18/2022, 4:21 PM

## 2022-10-29 NOTE — Telephone Encounter (Signed)
Can you take motrin?

## 2022-11-04 ENCOUNTER — Telehealth (INDEPENDENT_AMBULATORY_CARE_PROVIDER_SITE_OTHER): Payer: Medicare PPO | Admitting: Family

## 2022-11-04 ENCOUNTER — Encounter: Payer: Self-pay | Admitting: Family

## 2022-11-04 DIAGNOSIS — J0110 Acute frontal sinusitis, unspecified: Secondary | ICD-10-CM

## 2022-11-04 MED ORDER — AMOXICILLIN-POT CLAVULANATE 875-125 MG PO TABS: 1.0000 | ORAL_TABLET | Freq: Two times a day (BID) | ORAL | 0 refills | Status: DC

## 2022-11-04 NOTE — Progress Notes (Signed)
Virtual Visit Consent   Peggy Smith, you are scheduled for a virtual visit with a [facility] provider today. Just as with appointments in the office, your consent must be obtained to participate. Your consent will be active for this visit and any virtual visit you may have with one of our providers in the next 365 days. If you have a MyChart account, a copy of this consent can be sent to you electronically.  As this is a virtual visit, video technology does not allow for your provider to perform a traditional examination. This may limit your provider's ability to fully assess your condition. If your provider identifies any concerns that need to be evaluated in person or the need to arrange testing (such as labs, EKG, etc.), we will make arrangements to do so. Although advances in technology are sophisticated, we cannot ensure that it will always work on either your end or our end. If the connection with a video visit is poor, the visit may have to be switched to a telephone visit. With either a video or telephone visit, we are not always able to ensure that we have a secure connection.  By engaging in this virtual visit, you consent to the provision of healthcare and authorize for your insurance to be billed (if applicable) for the services provided during this visit. Depending on your insurance coverage, you may receive a charge related to this service.  I need to obtain your verbal consent now. Are you willing to proceed with your visit today? Peggy Smith has provided verbal consent on 11/04/2022 for a virtual visit (video or telephone). Evelina Dun, FNP  Date: 11/04/2022 1:00 PM  Virtual Visit via Video Note   I, Evelina Dun, connected with  Peggy Smith  ([id number], [birth year]) on 11/04/22 at  5:45 PM EDT by a video-enabled telemedicine application and verified that I am speaking with the correct person using two identifiers.  Location: Patient: Virtual Visit Location Patient:  Home Provider: Virtual Visit Location Provider: Office/Clinic   I discussed the limitations of evaluation and management by telemedicine and the availability of in person appointments. The patient expressed understanding and agreed to proceed.    History of Present Illness: Peggy Smith is a 77 y.o. who identifies as a female who was assigned female at birth, and is being seen today for headache for the last week.  HPI: Headache  This is a new problem. The current episode started 1 to 4 weeks ago. The pain is located in the Frontal region. The pain quality is similar to prior headaches. The quality of the pain is described as aching. The pain is at a severity of 6/10. The pain is moderate. Associated symptoms include coughing and sinus pressure. Pertinent negatives include no dizziness, ear pain, eye pain, muscle aches or sore throat. She has tried acetaminophen and NSAIDs for the symptoms. The treatment provided mild relief.    Problems:  Patient Active Problem List   Diagnosis Date Noted   Gallstone 10/24/2022   Coronary artery disease involving native coronary artery of native heart without angina pectoris 06/06/2021   BMI 29.0-29.9,adult 12/18/2018   Iron deficiency anemia    Melanoma    GERD (gastroesophageal reflux disease)    Seasonal allergic rhinitis 04/20/2013   Panic attacks 01/19/2013   Anxiety state 01/19/2013   Mixed hyperlipidemia 01/19/2013   Hypothyroidism 01/19/2013   Tobacco abuse 01/14/2011   HTN (hypertension) 01/14/2011    Allergies:  Allergies  Allergen Reactions  Crestor [Rosuvastatin]     Muscle aches   Macrodantin Other (See Comments)    Liver shut down   Morphine Nausea And Vomiting   Sulfa Antibiotics Other (See Comments)    agitation   Codeine Nausea And Vomiting        Medications:  Current Outpatient Medications:    amoxicillin-clavulanate (AUGMENTIN) 875-125 MG tablet, Take 1 tablet by mouth 2 (two) times daily., Disp: 14 tablet, Rfl:  0   levocetirizine (XYZAL) 5 MG tablet, Take 5 mg by mouth every evening., Disp: , Rfl:    amLODipine (NORVASC) 5 MG tablet, TAKE 1 TABLET BY MOUTH EVERY DAY, Disp: 90 tablet, Rfl: 1   aspirin 81 MG tablet, Take 81 mg by mouth every morning., Disp: , Rfl:    atorvastatin (LIPITOR) 40 MG tablet, Take 1 tablet (40 mg total) by mouth 2 (two) times a week. Take Mon, [place], Disp: 90 tablet, Rfl: 1   Bempedoic Acid-Ezetimibe (NEXLIZET) 180-10 MG TABS, Take 1 tablet by mouth daily., Disp: 30 tablet, Rfl: 6   calcium carbonate (OS-CAL) 600 MG TABS, Take 600 mg by mouth every evening., Disp: , Rfl:    Cholecalciferol (VITAMIN D-3) 5000 UNITS TABS, Take 5,000 Units by mouth every evening., Disp: , Rfl:    clonazePAM (KLONOPIN) 0.5 MG tablet, Take 1 tablet (0.5 mg total) by mouth 2 (two) times daily as needed for anxiety., Disp: 60 tablet, Rfl: 5   escitalopram (LEXAPRO) 20 MG tablet, TAKE 1 TABLET BY MOUTH EVERYDAY AT BEDTIME, Disp: 90 tablet, Rfl: 1   fluticasone (FLONASE) 50 MCG/ACT nasal spray, SPRAY 2 SPRAYS INTO EACH NOSTRIL EVERY DAY, Disp: 48 mL, Rfl: 1   HYDROcodone-acetaminophen (NORCO/VICODIN) 5-325 MG tablet, Take 1 tablet by mouth every 6 (six) hours as needed., Disp: 6 tablet, Rfl: 0   levothyroxine (SYNTHROID) 88 MCG tablet, Take 1 tablet (88 mcg total) by mouth daily., Disp: 90 tablet, Rfl: 1   magnesium 30 MG tablet, Take 1 tablet (30 mg total) by mouth daily., Disp: 90 tablet, Rfl: 1   Multiple Vitamins-Minerals (CENTRUM SILVER PO), Take 1 tablet by mouth every morning., Disp: , Rfl:    omeprazole (PRILOSEC) 40 MG capsule, Take 1 capsule (40 mg total) by mouth daily., Disp: 90 capsule, Rfl: 1   ondansetron (ZOFRAN) 4 MG tablet, Take 1 tablet (4 mg total) by mouth every 8 (eight) hours as needed for nausea or vomiting., Disp: 4 tablet, Rfl: 0   Riboflavin (VITAMIN B-2 PO), Take 100 mg by mouth every morning., Disp: , Rfl:   Observations/Objective: Patient is well-developed, well-nourished in  no acute distress.  Resting comfortably  at home.  Head is normocephalic, atraumatic.  No labored breathing.  Speech is clear and coherent with logical content.  Patient is alert and oriented at baseline.  Nasal congestion  Assessment and Plan: 1. Acute non-recurrent frontal sinusitis - amoxicillin-clavulanate (AUGMENTIN) 875-125 MG tablet; Take 1 tablet by mouth 2 (two) times daily.  Dispense: 14 tablet; Refill: 0  - Take meds as prescribed - Use a cool mist humidifier  -Use saline nose sprays frequently -Force fluids -For any cough or congestion  Use plain Mucinex- regular strength or max strength is fine -For fever or aces or pains- take tylenol or ibuprofen. -Throat lozenges if help -Follow up if symptoms worsen or do not improve   Follow Up Instructions: I discussed the assessment and treatment plan with the patient. The patient was provided an opportunity to ask questions and all were answered. The  patient agreed with the plan and demonstrated an understanding of the instructions.  A copy of instructions were sent to the patient via MyChart unless otherwise noted below.     The patient was advised to call back or seek an in-person evaluation if the symptoms worsen or if the condition fails to improve as anticipated.  Time:  I spent 10 minutes with the patient via telehealth technology discussing the above problems/concerns.    Evelina Dun, FNP

## 2022-11-04 NOTE — Patient Instructions (Signed)

## 2022-11-05 DIAGNOSIS — Z79899 Other long term (current) drug therapy: Secondary | ICD-10-CM | POA: Diagnosis not present

## 2022-11-05 DIAGNOSIS — E860 Dehydration: Secondary | ICD-10-CM | POA: Diagnosis not present

## 2022-11-05 DIAGNOSIS — I959 Hypotension, unspecified: Secondary | ICD-10-CM | POA: Diagnosis not present

## 2022-11-05 DIAGNOSIS — Z20822 Contact with and (suspected) exposure to covid-19: Secondary | ICD-10-CM | POA: Diagnosis not present

## 2022-11-05 DIAGNOSIS — D649 Anemia, unspecified: Secondary | ICD-10-CM | POA: Diagnosis not present

## 2022-11-05 DIAGNOSIS — Z1152 Encounter for screening for COVID-19: Secondary | ICD-10-CM | POA: Diagnosis not present

## 2022-11-05 DIAGNOSIS — Z7982 Long term (current) use of aspirin: Secondary | ICD-10-CM | POA: Diagnosis not present

## 2022-11-05 DIAGNOSIS — G43909 Migraine, unspecified, not intractable, without status migrainosus: Secondary | ICD-10-CM | POA: Diagnosis not present

## 2022-11-05 DIAGNOSIS — I10 Essential (primary) hypertension: Secondary | ICD-10-CM | POA: Diagnosis not present

## 2022-11-05 DIAGNOSIS — E079 Disorder of thyroid, unspecified: Secondary | ICD-10-CM | POA: Diagnosis not present

## 2022-11-05 DIAGNOSIS — G4489 Other headache syndrome: Secondary | ICD-10-CM | POA: Diagnosis not present

## 2022-11-05 NOTE — Patient Instructions (Signed)
Peggy Smith  11/05/2022     @PREFPERIOPPHARMACY @   Your procedure is scheduled on  11/11/2022.   Report to [facility] at  0600  A.M.   Call this number if you have problems the morning of surgery:  [phone]  If you experience any cold or flu symptoms such as cough, fever, chills, shortness of breath, etc. between now and your scheduled surgery, please notify us at the above number.   Remember:  Do not eat or drink after midnight.      Take these medicines the morning of surgery with A SIP OF WATER      amlodipine, clonazepam, levothyroxine, omeprazole.     Do not wear jewelry, make-up or nail polish.  Do not wear lotions, powders, or perfumes, or deodorant.  Do not shave 48 hours prior to surgery.  Men may shave face and neck.  Do not bring valuables to the hospital.  [facility] is not responsible for any belongings or valuables.  Contacts, dentures or bridgework may not be worn into surgery.  Leave your suitcase in the car.  After surgery it may be brought to your room.  For patients admitted to the hospital, discharge time will be determined by your treatment team.  Patients discharged the day of surgery will not be allowed to drive home and must have someone with them for 24 hours.    Special instructions:   DO NOT smoke tobacco or vape for 24 hours before your procedure.  Please read over the following fact sheets that you were given. Coughing and Deep Breathing, Surgical Site Infection Prevention, Anesthesia Post-op Instructions, and Care and Recovery After Surgery      Minimally Invasive Cholecystectomy, Care After The following information offers guidance on how to care for yourself after your procedure. Your health care provider may also give you more specific instructions. If you have problems or questions, contact your health care provider. What can I expect after the procedure? After the procedure, it is common to have: Pain at your  incision sites. You will be given medicines to control this pain. Mild nausea or vomiting. Bloating and possible shoulder pain from the gas that was used during the procedure. Follow these instructions at home: Medicines Take over-the-counter and prescription medicines only as told by your health care provider. If you were prescribed an antibiotic medicine, take it as told by your health care provider. Do not stop using the antibiotic even if you start to feel better. Ask your health care provider if the medicine prescribed to you: Requires you to avoid driving or using machinery. Can cause constipation. You may need to take these actions to prevent or treat constipation: Drink enough fluid to keep your urine pale yellow. Take over-the-counter or prescription medicines. Eat foods that are high in fiber, such as beans, whole grains, and fresh fruits and vegetables. Limit foods that are high in fat and processed sugars, such as fried or sweet foods. Incision care  Follow instructions from your health care provider about how to take care of your incisions. Make sure you: Wash your hands with soap and water for at least 20 seconds before and after you change your bandage (dressing). If soap and water are not available, use hand sanitizer. Change your dressing as told by your health care provider. Leave stitches (sutures), skin glue, or adhesive strips in place. These skin closures may need to be in place for 2 weeks or longer. If  adhesive strip edges start to loosen and curl up, you may trim the loose edges. Do not remove adhesive strips completely unless your health care provider tells you to do that. Do not take baths, swim, or use a hot tub until your health care provider approves. Ask your health care provider if you may take showers. You may only be allowed to take sponge baths. Check your incision area every day for signs of infection. Check for: More redness, swelling, or pain. Fluid or  blood. Warmth. Pus or a bad smell. Activity Rest as told by your health care provider. Do not do activities that require a lot of effort. Avoid sitting for a long time without moving. Get up to take short walks every 1-2 hours. This is important to improve blood flow and breathing. Ask for help if you feel weak or unsteady. Do not lift anything that is heavier than 10 lb (4.5 kg), or the limit that you are told, until your health care provider says that it is safe. Do not play contact sports until your health care provider approves. Do not return to work or school until your health care provider approves. Return to your normal activities as told by your health care provider. Ask your health care provider what activities are safe for you. General instructions If you were given a sedative during the procedure, it can affect you for several hours. Do not drive or operate machinery until your health care provider says that it is safe. Keep all follow-up visits. This is important. Contact a health care provider if: You develop a rash. You have more redness, swelling, or pain around your incisions. You have fluid or blood coming from your incisions. Your incisions feel warm to the touch. You have pus or a bad smell coming from your incisions. You have a fever. One or more of your incisions breaks open. Get help right away if: You have trouble breathing. You have chest pain. You have more pain in your shoulders. You faint or feel dizzy when you stand. You have severe pain in your abdomen. You have nausea or vomiting that lasts for more than one day. You have leg pain that is new or unusual, or if it is localized to one specific spot. These symptoms may represent a serious problem that is an emergency. Do not wait to see if the symptoms will go away. Get medical help right away. Call your local emergency services (911 in the [place] Do not drive yourself to the hospital. Summary After your  procedure, it is common to have pain at the incision sites. You may also have nausea or bloating. Follow your health care provider's instructions about medicine, activity restrictions, and caring for your incision areas. Do not do activities that require a lot of effort. Contact a health care provider if you have a fever or other signs of infection, such as more redness, swelling, or pain around the incisions. Get help right away if you have chest pain, increasing pain in the shoulders, or trouble breathing. This information is not intended to replace advice given to you by your health care provider. Make sure you discuss any questions you have with your health care provider. Document Revised: 01/23/2021 Document Reviewed: 01/23/2021 Elsevier Patient Education  [place] Anesthesia, Adult, Care After The following information offers guidance on how to care for yourself after your procedure. Your health care provider may also give you more specific instructions. If you have problems or questions, contact  your health care provider. What can I expect after the procedure? After the procedure, it is common for people to: Have pain or discomfort at the IV site. Have nausea or vomiting. Have a sore throat or hoarseness. Have trouble concentrating. Feel cold or chills. Feel weak, sleepy, or tired (fatigue). Have soreness and body aches. These can affect parts of the body that were not involved in surgery. Follow these instructions at home: For the time period you were told by your health care provider:  Rest. Do not participate in activities where you could fall or become injured. Do not drive or use machinery. Do not drink alcohol. Do not take sleeping pills or medicines that cause drowsiness. Do not make important decisions or sign legal documents. Do not take care of children on your own. General instructions Drink enough fluid to keep your urine pale yellow. If you have  sleep apnea, surgery and certain medicines can increase your risk for breathing problems. Follow instructions from your health care provider about wearing your sleep device: Anytime you are sleeping, including during daytime naps. While taking prescription pain medicines, sleeping medicines, or medicines that make you drowsy. Return to your normal activities as told by your health care provider. Ask your health care provider what activities are safe for you. Take over-the-counter and prescription medicines only as told by your health care provider. Do not use any products that contain nicotine or tobacco. These products include cigarettes, chewing tobacco, and vaping devices, such as e-cigarettes. These can delay incision healing after surgery. If you need help quitting, ask your health care provider. Contact a health care provider if: You have nausea or vomiting that does not get better with medicine. You vomit every time you eat or drink. You have pain that does not get better with medicine. You cannot urinate or have bloody urine. You develop a skin rash. You have a fever. Get help right away if: You have trouble breathing. You have chest pain. You vomit blood. These symptoms may be an emergency. Get help right away. Call 911. Do not wait to see if the symptoms will go away. Do not drive yourself to the hospital. Summary After the procedure, it is common to have a sore throat, hoarseness, nausea, vomiting, or to feel weak, sleepy, or fatigue. For the time period you were told by your health care provider, do not drive or use machinery. Get help right away if you have difficulty breathing, have chest pain, or vomit blood. These symptoms may be an emergency. This information is not intended to replace advice given to you by your health care provider. Make sure you discuss any questions you have with your health care provider. Document Revised: 10/19/2021 Document Reviewed:  10/19/2021 Elsevier Patient Education  [place].

## 2022-11-06 ENCOUNTER — Encounter (HOSPITAL_COMMUNITY)
Admission: RE | Admit: 2022-11-06 | Discharge: 2022-11-06 | Disposition: A | Discharge status: Home / Self Care | Payer: Medicare PPO | Source: Ambulatory Visit | Attending: General Surgery | Admitting: General Surgery

## 2022-11-06 ENCOUNTER — Encounter (HOSPITAL_COMMUNITY): Payer: Self-pay

## 2022-11-06 NOTE — Pre-Procedure Instructions (Signed)
Attempted pre-op phonecall. Left VM for her to call us back. 

## 2022-11-07 ENCOUNTER — Encounter (HOSPITAL_COMMUNITY): Payer: Self-pay

## 2022-11-07 ENCOUNTER — Other Ambulatory Visit: Payer: Self-pay

## 2022-11-11 ENCOUNTER — Ambulatory Visit (HOSPITAL_BASED_OUTPATIENT_CLINIC_OR_DEPARTMENT_OTHER): Payer: Medicare PPO | Admitting: Certified Registered"

## 2022-11-11 ENCOUNTER — Encounter (HOSPITAL_COMMUNITY): Payer: Self-pay | Admitting: General Surgery

## 2022-11-11 ENCOUNTER — Other Ambulatory Visit (HOSPITAL_COMMUNITY): Payer: Medicare PPO

## 2022-11-11 ENCOUNTER — Ambulatory Visit (HOSPITAL_COMMUNITY): Payer: Medicare PPO | Admitting: Certified Registered"

## 2022-11-11 ENCOUNTER — Ambulatory Visit (HOSPITAL_COMMUNITY)
Admission: RE | Admit: 2022-11-11 | Discharge: 2022-11-11 | Disposition: A | Discharge status: Home / Self Care | Payer: Medicare PPO | Attending: General Surgery | Admitting: General Surgery

## 2022-11-11 ENCOUNTER — Encounter (HOSPITAL_COMMUNITY)
Admission: RE | Disposition: A | Discharge status: Home / Self Care | Payer: Self-pay | Source: Home / Self Care | Attending: General Surgery

## 2022-11-11 ENCOUNTER — Other Ambulatory Visit: Payer: Self-pay

## 2022-11-11 DIAGNOSIS — E039 Hypothyroidism, unspecified: Secondary | ICD-10-CM | POA: Diagnosis not present

## 2022-11-11 DIAGNOSIS — K219 Gastro-esophageal reflux disease without esophagitis: Secondary | ICD-10-CM | POA: Diagnosis not present

## 2022-11-11 DIAGNOSIS — K810 Acute cholecystitis: Secondary | ICD-10-CM

## 2022-11-11 DIAGNOSIS — F419 Anxiety disorder, unspecified: Secondary | ICD-10-CM | POA: Diagnosis not present

## 2022-11-11 DIAGNOSIS — F17210 Nicotine dependence, cigarettes, uncomplicated: Secondary | ICD-10-CM | POA: Insufficient documentation

## 2022-11-11 DIAGNOSIS — I10 Essential (primary) hypertension: Secondary | ICD-10-CM | POA: Insufficient documentation

## 2022-11-11 DIAGNOSIS — K8012 Calculus of gallbladder with acute and chronic cholecystitis without obstruction: Secondary | ICD-10-CM | POA: Diagnosis not present

## 2022-11-11 DIAGNOSIS — I2510 Atherosclerotic heart disease of native coronary artery without angina pectoris: Secondary | ICD-10-CM | POA: Insufficient documentation

## 2022-11-11 DIAGNOSIS — F32A Depression, unspecified: Secondary | ICD-10-CM | POA: Insufficient documentation

## 2022-11-11 DIAGNOSIS — K8020 Calculus of gallbladder without cholecystitis without obstruction: Secondary | ICD-10-CM

## 2022-11-11 SURGERY — CHOLECYSTECTOMY, ROBOT-ASSISTED, LAPAROSCOPIC
Anesthesia: General | Site: Abdomen

## 2022-11-11 MED ORDER — SCOPOLAMINE 1 MG/3DAYS TD PT72
MEDICATED_PATCH | TRANSDERMAL | Status: AC
  Filled 2022-11-11: qty 1

## 2022-11-11 MED ORDER — ONDANSETRON HCL 4 MG/2ML IJ SOLN: 4.0000 mg | Freq: Once | INTRAMUSCULAR | Status: DC | PRN

## 2022-11-11 MED ORDER — INDOCYANINE GREEN 25 MG IV SOLR
INTRAVENOUS | Status: AC
  Administered 2022-11-11: 2.5 mg via INTRAVENOUS
  Filled 2022-11-11: qty 10

## 2022-11-11 MED ORDER — LIDOCAINE HCL (PF) 2 % IJ SOLN
INTRAMUSCULAR | Status: AC
  Filled 2022-11-11: qty 5

## 2022-11-11 MED ORDER — MIDAZOLAM HCL 2 MG/2ML IJ SOLN
INTRAMUSCULAR | Status: DC | PRN
  Administered 2022-11-11: 2 mg via INTRAVENOUS

## 2022-11-11 MED ORDER — DIPHENHYDRAMINE HCL 50 MG/ML IJ SOLN
INTRAMUSCULAR | Status: AC
  Filled 2022-11-11: qty 1

## 2022-11-11 MED ORDER — ROCURONIUM BROMIDE 10 MG/ML (PF) SYRINGE
PREFILLED_SYRINGE | INTRAVENOUS | Status: AC
  Filled 2022-11-11: qty 10

## 2022-11-11 MED ORDER — DEXAMETHASONE SODIUM PHOSPHATE 10 MG/ML IJ SOLN
INTRAMUSCULAR | Status: AC
  Filled 2022-11-11: qty 1

## 2022-11-11 MED ORDER — TRAMADOL HCL 50 MG PO TABS: 50.0000 mg | ORAL_TABLET | Freq: Four times a day (QID) | ORAL | 0 refills | Status: DC | PRN

## 2022-11-11 MED ORDER — FENTANYL CITRATE (PF) 250 MCG/5ML IJ SOLN
INTRAMUSCULAR | Status: AC
  Filled 2022-11-11: qty 5

## 2022-11-11 MED ORDER — ONDANSETRON HCL 4 MG/2ML IJ SOLN
INTRAMUSCULAR | Status: AC
  Filled 2022-11-11: qty 2

## 2022-11-11 MED ORDER — CHLORHEXIDINE GLUCONATE 0.12 % MT SOLN: 15.0000 mL | Freq: Once | OROMUCOSAL | Status: DC

## 2022-11-11 MED ORDER — FENTANYL CITRATE (PF) 250 MCG/5ML IJ SOLN
INTRAMUSCULAR | Status: DC | PRN
  Administered 2022-11-11 (×4): 50 ug via INTRAVENOUS

## 2022-11-11 MED ORDER — BUPIVACAINE HCL (PF) 0.5 % IJ SOLN
INTRAMUSCULAR | Status: DC | PRN
  Administered 2022-11-11: 30 mL

## 2022-11-11 MED ORDER — OXYCODONE HCL 5 MG/5ML PO SOLN: 5.0000 mg | Freq: Once | ORAL | Status: DC | PRN

## 2022-11-11 MED ORDER — SUCCINYLCHOLINE CHLORIDE 200 MG/10ML IV SOSY
PREFILLED_SYRINGE | INTRAVENOUS | Status: AC
  Filled 2022-11-11: qty 10

## 2022-11-11 MED ORDER — DEXAMETHASONE SODIUM PHOSPHATE 4 MG/ML IJ SOLN
INTRAMUSCULAR | Status: DC | PRN
  Administered 2022-11-11: 5 mg via INTRAVENOUS

## 2022-11-11 MED ORDER — LACTATED RINGERS IV SOLN: INTRAVENOUS | Status: DC

## 2022-11-11 MED ORDER — CHLORHEXIDINE GLUCONATE CLOTH 2 % EX PADS: 6.0000 | MEDICATED_PAD | Freq: Once | CUTANEOUS | Status: DC

## 2022-11-11 MED ORDER — KETOROLAC TROMETHAMINE 30 MG/ML IJ SOLN
INTRAMUSCULAR | Status: AC
  Filled 2022-11-11: qty 1

## 2022-11-11 MED ORDER — OXYCODONE HCL 5 MG PO TABS: 5.0000 mg | ORAL_TABLET | Freq: Once | ORAL | Status: DC | PRN

## 2022-11-11 MED ORDER — PROPOFOL 10 MG/ML IV BOLUS
INTRAVENOUS | Status: AC
  Filled 2022-11-11: qty 20

## 2022-11-11 MED ORDER — SODIUM CHLORIDE 0.9 % IV SOLN
2.0000 g | INTRAVENOUS | Status: AC
  Administered 2022-11-11: 2 g via INTRAVENOUS
  Filled 2022-11-11: qty 2

## 2022-11-11 MED ORDER — ONDANSETRON HCL 4 MG/2ML IJ SOLN
INTRAMUSCULAR | Status: DC | PRN
  Administered 2022-11-11: 4 mg via INTRAVENOUS

## 2022-11-11 MED ORDER — ROCURONIUM BROMIDE 10 MG/ML (PF) SYRINGE
PREFILLED_SYRINGE | INTRAVENOUS | Status: DC | PRN
  Administered 2022-11-11: 45 mg via INTRAVENOUS
  Administered 2022-11-11: 5 mg via INTRAVENOUS

## 2022-11-11 MED ORDER — EPHEDRINE SULFATE-NACL 50-0.9 MG/10ML-% IV SOSY
PREFILLED_SYRINGE | INTRAVENOUS | Status: DC | PRN
  Administered 2022-11-11: 5 mg via INTRAVENOUS

## 2022-11-11 MED ORDER — FENTANYL CITRATE PF 50 MCG/ML IJ SOSY
25.0000 ug | PREFILLED_SYRINGE | INTRAMUSCULAR | Status: DC | PRN
  Administered 2022-11-11: 50 ug via INTRAVENOUS
  Filled 2022-11-11: qty 1

## 2022-11-11 MED ORDER — ORAL CARE MOUTH RINSE: 15.0000 mL | Freq: Once | OROMUCOSAL | Status: DC

## 2022-11-11 MED ORDER — PROPOFOL 10 MG/ML IV BOLUS
INTRAVENOUS | Status: DC | PRN
  Administered 2022-11-11: 150 mg via INTRAVENOUS

## 2022-11-11 MED ORDER — SCOPOLAMINE 1 MG/3DAYS TD PT72
1.0000 | MEDICATED_PATCH | Freq: Once | TRANSDERMAL | Status: DC
  Administered 2022-11-11: 1.5 mg via TRANSDERMAL

## 2022-11-11 MED ORDER — PHENYLEPHRINE 80 MCG/ML (10ML) SYRINGE FOR IV PUSH (FOR BLOOD PRESSURE SUPPORT)
PREFILLED_SYRINGE | INTRAVENOUS | Status: AC
  Filled 2022-11-11: qty 10

## 2022-11-11 MED ORDER — HEMOSTATIC AGENTS (NO CHARGE) OPTIME
TOPICAL | Status: DC | PRN
  Administered 2022-11-11: 1 via TOPICAL

## 2022-11-11 MED ORDER — ONDANSETRON HCL 4 MG PO TABS: 4.0000 mg | ORAL_TABLET | Freq: Three times a day (TID) | ORAL | 1 refills | Status: DC | PRN

## 2022-11-11 MED ORDER — LIDOCAINE 2% (20 MG/ML) 5 ML SYRINGE
INTRAMUSCULAR | Status: DC | PRN
  Administered 2022-11-11: 100 mg via INTRAVENOUS

## 2022-11-11 MED ORDER — EPHEDRINE 5 MG/ML INJ
INTRAVENOUS | Status: AC
  Filled 2022-11-11: qty 5

## 2022-11-11 MED ORDER — INDOCYANINE GREEN 25 MG IV SOLR
2.5000 mg | Freq: Once | INTRAVENOUS | Status: AC
  Filled 2022-11-11: qty 10

## 2022-11-11 MED ORDER — AMOXICILLIN-POT CLAVULANATE 875-125 MG PO TABS: 1.0000 | ORAL_TABLET | Freq: Two times a day (BID) | ORAL | 0 refills | Status: AC

## 2022-11-11 MED ORDER — STERILE WATER FOR IRRIGATION IR SOLN
Status: DC | PRN
  Administered 2022-11-11: 500 mL

## 2022-11-11 MED ORDER — BUPIVACAINE HCL (PF) 0.5 % IJ SOLN
INTRAMUSCULAR | Status: AC
  Filled 2022-11-11: qty 30

## 2022-11-11 MED ORDER — MIDAZOLAM HCL 2 MG/2ML IJ SOLN
INTRAMUSCULAR | Status: AC
  Filled 2022-11-11: qty 2

## 2022-11-11 MED ORDER — SUGAMMADEX SODIUM 200 MG/2ML IV SOLN
INTRAVENOUS | Status: DC | PRN
  Administered 2022-11-11: 200 mg via INTRAVENOUS

## 2022-11-11 MED ORDER — DIPHENHYDRAMINE HCL 50 MG/ML IJ SOLN
INTRAMUSCULAR | Status: DC | PRN
  Administered 2022-11-11: 25 mg via INTRAVENOUS

## 2022-11-11 SURGICAL SUPPLY — 49 items
ADH SKN CLS APL DERMABOND .7 (GAUZE/BANDAGES/DRESSINGS) ×1
APL PRP STRL LF DISP 70% ISPRP (MISCELLANEOUS) ×1
BLADE SURG 15 STRL LF DISP TIS (BLADE) ×1 IMPLANT
BLADE SURG 15 STRL SS (BLADE) ×1
CAUTERY HOOK MNPLR 1.6 DVNC XI (INSTRUMENTS) ×2 IMPLANT
CHLORAPREP W/TINT 26 (MISCELLANEOUS) ×1 IMPLANT
CLIP LIGATING HEM O LOK PURPLE (MISCELLANEOUS) ×1 IMPLANT
COVER LIGHT HANDLE STERIS (MISCELLANEOUS) ×2 IMPLANT
COVER TIP SHEARS 8 DVNC (MISCELLANEOUS) ×1 IMPLANT
DEFOGGER SCOPE WARMER CLEARIFY (MISCELLANEOUS) IMPLANT
DERMABOND ADVANCED .7 DNX12 (GAUZE/BANDAGES/DRESSINGS) ×1 IMPLANT
DRAPE ARM DVNC X/XI (DISPOSABLE) ×4 IMPLANT
DRAPE COLUMN DVNC XI (DISPOSABLE) ×1 IMPLANT
ELECT REM PT RETURN 9FT ADLT (ELECTROSURGICAL) ×1
ELECTRODE REM PT RTRN 9FT ADLT (ELECTROSURGICAL) ×1 IMPLANT
FORCEPS BPLR R/ABLATION 8 DVNC (INSTRUMENTS) ×2 IMPLANT
FORCEPS PROGRASP DVNC XI (FORCEP) ×2 IMPLANT
GAUZE SPONGE 2X2 STRL 8-PLY (GAUZE/BANDAGES/DRESSINGS) IMPLANT
GLOVE BIO SURGEON STRL SZ 6.5 (GLOVE) ×2 IMPLANT
GLOVE BIOGEL PI IND STRL 6.5 (GLOVE) ×2 IMPLANT
GLOVE BIOGEL PI IND STRL 7.0 (GLOVE) ×2 IMPLANT
GOWN STRL REUS W/TWL LRG LVL3 (GOWN DISPOSABLE) ×4 IMPLANT
GRASPER SUT TROCAR 14GX15 (MISCELLANEOUS) IMPLANT
HEMOSTAT SNOW SURGICEL 2X4 (HEMOSTASIS) IMPLANT
IRRIGATOR SUCT 8 DISP DVNC XI (IRRIGATION / IRRIGATOR) IMPLANT
IV NS IRRIG 3000ML ARTHROMATIC (IV SOLUTION) ×1 IMPLANT
KIT TURNOVER KIT A (KITS) ×1 IMPLANT
MANIFOLD NEPTUNE II (INSTRUMENTS) IMPLANT
NDL HYPO 21X1.5 SAFETY (NEEDLE) ×1 IMPLANT
NDL INSUFFLATION 14GA 120MM (NEEDLE) ×1 IMPLANT
NEEDLE HYPO 21X1.5 SAFETY (NEEDLE) ×1 IMPLANT
NEEDLE INSUFFLATION 14GA 120MM (NEEDLE) ×1 IMPLANT
OBTURATOR OPTICAL STND 8 DVNC (TROCAR) ×1
OBTURATOR OPTICALSTD 8 DVNC (TROCAR) ×1 IMPLANT
PACK LAP CHOLE LZT030E (CUSTOM PROCEDURE TRAY) ×1 IMPLANT
PAD ARMBOARD 7.5X6 YLW CONV (MISCELLANEOUS) ×1 IMPLANT
PENCIL HANDSWITCHING (ELECTRODE) ×1 IMPLANT
SCISSORS MNPLR CVD DVNC XI (INSTRUMENTS) ×2 IMPLANT
SEAL CANN UNIV 5-8 DVNC XI (MISCELLANEOUS) ×4 IMPLANT
SET BASIN LINEN APH (SET/KITS/TRAYS/PACK) ×1 IMPLANT
SET TUBE SMOKE EVAC HIGH FLOW (TUBING) ×1 IMPLANT
STAPLER VISISTAT (STAPLE) IMPLANT
SUT MNCRL AB 4-0 PS2 18 (SUTURE) ×2 IMPLANT
SUT VICRYL 0 AB UR-6 (SUTURE) IMPLANT
SYR 30ML LL (SYRINGE) ×1 IMPLANT
SYS RETRIEVAL 5MM INZII UNIV (BASKET) ×1
SYSTEM RETRIEVL 5MM INZII UNIV (BASKET) ×1 IMPLANT
TAPE PAPER 2X10 WHT MICROPORE (GAUZE/BANDAGES/DRESSINGS) IMPLANT
WATER STERILE IRR 500ML POUR (IV SOLUTION) ×1 IMPLANT

## 2022-11-11 NOTE — Transfer of Care (Signed)
Immediate Anesthesia Transfer of Care Note  Patient: Peggy Smith  Procedure(s) Performed: XI ROBOTIC ASSISTED LAPAROSCOPIC CHOLECYSTECTOMY (Abdomen)  Patient Location: PACU  Anesthesia Type:General  Level of Consciousness: awake  Airway & Oxygen Therapy: Patient Spontanous Breathing and Patient connected to face mask oxygen  Post-op Assessment: Report given to RN and Post -op Vital signs reviewed and stable  Post vital signs: Reviewed and stable  Last Vitals:  Vitals Value Taken Time  BP    Temp    Pulse    Resp    SpO2      Last Pain:  Vitals:   11/11/22 0643  TempSrc: Oral  PainSc: 0-No pain         Complications: No notable events documented.

## 2022-11-11 NOTE — Anesthesia Procedure Notes (Signed)
Procedure Name: Intubation Date/Time: 11/11/2022 7:38 AM  Performed by: Julian Reil, CRNAPre-anesthesia Checklist: Patient identified, Emergency Drugs available, Suction available and Patient being monitored Patient Re-evaluated:Patient Re-evaluated prior to induction Oxygen Delivery Method: Circle system utilized Preoxygenation: Pre-oxygenation with 100% oxygen Induction Type: IV induction, Rapid sequence and Cricoid Pressure applied Laryngoscope Size: Miller and 3 Grade View: Grade II Tube type: Oral Tube size: 7.5 mm Number of attempts: 1 Airway Equipment and Method: Stylet Placement Confirmation: ETT inserted through vocal cords under direct vision, positive ETCO2 and breath sounds checked- equal and bilateral Secured at: 23 cm Tube secured with: Tape Dental Injury: Teeth and Oropharynx as per pre-operative assessment  Comments: 4x4s bite block used.

## 2022-11-11 NOTE — Discharge Instructions (Signed)
Discharge Robotic Assisted Laparoscopic Surgery Instructions:  Your gallbladder looked infected. I want you to take augmentin for 5 days. I sent a prescription in for this. If you already have a prescription for augmentin that is ok to use that one, but you need to be on it at least 5 days. If someone else was treating you for something and prescribed longer then you can take that length of time.   Common Complaints: Right shoulder pain is common after laparoscopic surgery.  This is secondary to the gas used in the surgery being trapped under the diaphragm.  Walk to help your body absorb the gas. This will improve in a few days. Pain at the port sites are common, especially the larger port sites. This will improve with time.  Some nausea is common and poor appetite. The main goal is to stay hydrated the first few days after surgery.   Diet/ Activity: Diet as tolerated. You may not have an appetite, but it is important to stay hydrated.  Drink 64 ounces of water a day. Your appetite will return with time.  Shower per your regular routine daily.  Do not take hot showers. Take warm showers that are less than 10 minutes. Rest and listen to your body, but do not remain in bed all day.  Walk everyday for at least 15-20 minutes. Deep cough and move around every 1-2 hours in the first few days after surgery.  Do not lift > 10 lbs, perform excessive bending, pushing, pulling, squatting for 1-2 weeks after surgery.  Do not pick at the dermabond glue on your incision sites.  This glue film will remain in place for 1-2 weeks and will start to peel off.  Do not place lotions or balms on your incision unless instructed to specifically by Dr. Henreitta Leber.   Pain Expectations and Narcotics: -After surgery you will have pain associated with your incisions and this is normal. The pain is muscular and nerve pain, and will get better with time. -You are encouraged and expected to take non narcotic medications like  tylenol and ibuprofen (when able) to treat pain as multiple modalities can aid with pain treatment. -Narcotics are only used when pain is severe or there is breakthrough pain. -You are not expected to have a pain score of 0 after surgery, as we cannot prevent pain. A pain score of 3-4 that allows you to be functional, move, walk, and tolerate some activity is the goal. The pain will continue to improve over the days after surgery and is dependent on your surgery. -Due to  law, we are only able to give a certain amount of pain medication to treat post operative pain, and we only give additional narcotics on a patient by patient basis.  -For most laparoscopic surgery, studies have shown that the majority of patients only need 10-15 narcotic pills, and for open surgeries most patients only need 15-20.   -Having appropriate expectations of pain and knowledge of pain management with non narcotics is important as we do not want anyone to become addicted to narcotic pain medication.  -Using ice packs in the first 48 hours and heating pads after 48 hours, wearing an abdominal binder (when recommended), and using over the counter medications are all ways to help with pain management.   -Simple acts like meditation and mindfulness practices after surgery can also help with pain control and research has proven the benefit of these practices.  Medication: Take tylenol and ibuprofen as needed for  pain control, alternating every 4-6 hours.  Example:  Tylenol 1000mg  @ 6am, 12noon, 6pm, (Do not exceed 4000mg  of tylenol a day). Ibuprofen 800mg  @ 9am, 3pm, 9pm, 3am (Do not exceed 3600mg  of ibuprofen a day).  Take Tramadol for breakthrough pain every 6 hours.  Take Colace for constipation related to narcotic pain medication. If you do not have a bowel movement in 2 days, take Miralax over the counter.  Drink plenty of water to also prevent constipation.   Contact Information: If you have questions or  concerns, please call our office, [phone], Monday- Thursday 8AM-5PM and Friday 8AM-12Noon.  If it is after hours or on the weekend, please call [facility] Main [facility], [phone], [phone], and ask to speak to the surgeon on call for Dr. Henreitta Leber at [facility].

## 2022-11-11 NOTE — Op Note (Signed)
[facility] Operative Note  11/11/22  Preoperative Diagnosis: Symptomatic Cholelithiasis   Postoperative Diagnosis: Acute cholecystitis    Procedure(s) Performed: Robotic Assisted Laparoscopic Cholecystectomy   Surgeon: Lillia Abed C. Henreitta Leber, MD   Assistants: No qualified resident was available    Anesthesia: General endotracheal   Anesthesiologist: Dr. Johnnette Litter, Md    Specimens: Gallbladder   Estimated Blood Loss: Minimal   Blood Replacement: None    Complications: None   Wound Class: Dirty Infected (purulent appearing bile)    Operative Indications: The patient was found to have stones on imaging and was symptomatic.  We discussed the risk of the procedure including but not limited to bleeding, infection, injury to the common bile duct, bile leak, need for further procedures, chance of subtotal cholecystectomy.   Findings:  Edematous gallbladder with brown purulent appearing bile when placed in bag  Critical view of safety noted All clips intact at the end of the case Adequate hemostasis   Procedure: Firefly was given in the preoperative area. The patient was taken to the operating room and placed supine. General endotracheal anesthesia was induced. Intravenous antibiotics were  administered per protocol.  An orogastric tube positioned to decompress the stomach. The abdomen was prepared and draped in the usual sterile fashion.   Veress needle was placed at the supraumbilical area and insufflation was started after confirming a positive saline drop test and no immediate increase in abdominal pressure.  After reaching 15 mm, the Veress needle was removed and a 8 mm port was placed via optiview technique supraumbilical, measuring 20 mm away from the suspected position of the gallbladder.  The abdomen was inspected and no abnormalities or injuries were found.  Under direct vision, ports were placed in the following locations in a semi curvilinear position around the  target of the gallbladder: Two 8 mm ports on the patient's right each having 8cm clearance to the adjacent ports and one 8 mm port placed on the patient's left 8 cm from the umbilical port. Once ports were placed, the table was placed in the reverse Trendelenburg position with the right side up. The Xi platform was brought into the operative field and docked to the ports successfully.  An endoscope was placed through the umbilical port, prograsp through the most lateral right port, forced bipolar to the port just right of the umbilicus, and then a hook cautery in the left port.   The gallbladder was edematous with acute inflammation. The dome of the gallbladder was grasped with prograsp and retracted over the dome of the liver. Adhesions between the gallbladder and omentum, duodenum and transverse colon were lysed via hook cautery. The infundibulum was grasped with the fenestrated grasper and retracted toward the right lower quadrant. This maneuver exposed Calot's triangle. Firefly was used throughout the dissection to ensure safe visualization of the cystic duct.The peritoneum overlying the gallbladder infundibulum was then dissected and the cystic duct and cystic artery identified.  Critical view of safety with the liver bed clearly visible behind the duct and artery with no additional structures noted.  The cystic duct and cystic artery were doubly clipped and divided close to the gallbladder.    The gallbladder was then dissected from its peritoneal and liver bed attachments by electrocautery. Hemostasis was checked prior to removing the hook cautery.   A 38mm Endo Catch bag was then placed through the left port. The [employer] was undocked and moved out of the field. While in the bag the  gallbladder started to ooze  a brown purulent looking bile and this did come out of the top of the bag due to the size of the gallbladder.  The gallbladder was removed, and was passed off the table as a specimen. There was no  evidence of bleeding from the gallbladder fossa or cystic artery or leakage of the bile from the cystic duct stump.  Suction was performed and Jamelle Haring was placed in the fossa.  The left sided port site was closed with a 0 Vicryl using a PMI. The abdomen was desufflated and secondary trocars were removed under direct vision. No bleeding was noted. All skin incisions were closed with staples given the purulent appearing bile.   Final inspection revealed acceptable hemostasis. All counts were correct at the end of the case. The patient was awakened from anesthesia and extubated without complication. The OG tube was removed.  The patient went to the PACU in stable condition.   Algis Greenhouse, MD [facility] [address] [place], [place] [address] [phone] (office)

## 2022-11-11 NOTE — Anesthesia Preprocedure Evaluation (Addendum)
Anesthesia Evaluation  Patient identified by MRN, date of birth, ID band Patient awake    Reviewed: Allergy & Precautions, H&P , NPO status , Patient's Chart, lab work & pertinent test results, reviewed documented beta blocker date and time   History of Anesthesia Complications (+) PONV and history of anesthetic complications  Airway Mallampati: II  TM Distance: >3 FB Neck ROM: full    Dental no notable dental hx. (+) Chipped,    Pulmonary Current Smoker   Pulmonary exam normal breath sounds clear to auscultation       Cardiovascular Exercise Tolerance: Good hypertension, + angina  + CAD  + dysrhythmias  Rhythm:regular Rate:Normal     Neuro/Psych  Headaches PSYCHIATRIC DISORDERS Anxiety Depression    negative neurological ROS  negative psych ROS   GI/Hepatic negative GI ROS, Neg liver ROS,GERD  ,,(+) Hepatitis -  Endo/Other  negative endocrine ROSHypothyroidism    Renal/GU negative Renal ROS  negative genitourinary   Musculoskeletal   Abdominal   Peds  Hematology negative hematology ROS (+) Blood dyscrasia, anemia   Anesthesia Other Findings   Reproductive/Obstetrics negative OB ROS                             Anesthesia Physical Anesthesia Plan  ASA: 2  Anesthesia Plan: General and General ETT   Post-op Pain Management:    Induction:   PONV Risk Score and Plan: Ondansetron and Scopolamine patch - Pre-op  Airway Management Planned:   Additional Equipment:   Intra-op Plan:   Post-operative Plan:   Informed Consent: I have reviewed the patients History and Physical, chart, labs and discussed the procedure including the risks, benefits and alternatives for the proposed anesthesia with the patient or authorized representative who has indicated his/her understanding and acceptance.     Dental Advisory Given  Plan Discussed with: CRNA  Anesthesia Plan Comments:         Anesthesia Quick Evaluation

## 2022-11-11 NOTE — Interval H&P Note (Signed)
History and Physical Interval Note:  11/11/2022 7:21 AM  Peggy Smith  has presented today for surgery, with the diagnosis of CHOLELITHIASIS.  The various methods of treatment have been discussed with the patient and family. After consideration of risks, benefits and other options for treatment, the patient has consented to  Procedure(s): XI ROBOTIC ASSISTED LAPAROSCOPIC CHOLECYSTECTOMY (N/A) as a surgical intervention.  The patient's history has been reviewed, patient examined, no change in status, stable for surgery.  I have reviewed the patient's chart and labs.  Questions were answered to the patient's satisfaction.    Only change is headaches. Discussed this is unlikely form her gallbladder.  Lucretia Roers

## 2022-11-11 NOTE — Progress Notes (Signed)
[facility]  The gallbladder appeared infected with purulent brown appearing bile when placed in the bag. Given this will treat with Augmentin for 5  days. She reported a prior Rx for Augmentin. I am unsure if she was on this or not. I want her to have 5  days of Augmentin after surgery (ending 4/13).   Will see her in the clinic to get staples out. Updated her son.   Algis Greenhouse, MD [facility] [address] [place], [place] [address] [phone] (office)

## 2022-11-12 LAB — SURGICAL PATHOLOGY

## 2022-11-12 NOTE — Anesthesia Postprocedure Evaluation (Signed)
Anesthesia Post Note  Patient: JANANI LANGWELL  Procedure(s) Performed: XI ROBOTIC ASSISTED LAPAROSCOPIC CHOLECYSTECTOMY (Abdomen)  Patient location during evaluation: Phase II Anesthesia Type: General Level of consciousness: awake Pain management: pain level controlled Vital Signs Assessment: post-procedure vital signs reviewed and stable Respiratory status: spontaneous breathing and respiratory function stable Cardiovascular status: blood pressure returned to baseline and stable Postop Assessment: no headache and no apparent nausea or vomiting Anesthetic complications: no Comments: Late entry   No notable events documented.   Last Vitals:  Vitals:   11/11/22 1000 11/11/22 1024  BP: 136/79 126/65  Pulse:  88  Resp: 13 18  Temp:    SpO2:  98%    Last Pain:  Vitals:   11/11/22 1024  TempSrc:   PainSc: 2                  Windell Norfolk

## 2022-11-18 ENCOUNTER — Other Ambulatory Visit: Payer: Self-pay | Admitting: Internal Medicine

## 2022-11-18 NOTE — Telephone Encounter (Signed)
It Is ok to take 2 tylenol every 6 hours as needed. You may want to get some flonase nasal spray and use daily- headache may be coming form sinuses

## 2022-11-26 ENCOUNTER — Encounter: Payer: Self-pay | Admitting: General Surgery

## 2022-11-26 ENCOUNTER — Ambulatory Visit (INDEPENDENT_AMBULATORY_CARE_PROVIDER_SITE_OTHER): Payer: Medicare PPO | Admitting: General Surgery

## 2022-11-26 VITALS — BP 123/78 | HR 74 | Temp 98.3°F | Resp 14 | Ht 69.0 in | Wt 163.0 lb

## 2022-11-26 DIAGNOSIS — K810 Acute cholecystitis: Secondary | ICD-10-CM

## 2022-11-26 NOTE — Patient Instructions (Signed)
Diet and activity as tolerated.  Steristrips will peel off in the next 5-7 days. You can remove them once they are peeling off. It is ok to shower. Pat the area dry.

## 2022-11-26 NOTE — Progress Notes (Unsigned)
[facility]  Doing well but appetite was lower at first. Has finished antibiotics. Less sore.  BP 123/78   Pulse 74   Temp 98.3 F (36.8 C) (Oral)   Resp 14   Ht 5\' 9"  (1.753 m)   Wt 163 lb (73.9 kg)   SpO2 94%   BMI 24.07 kg/m  Port sites with staples c/d/I, removed and steri strips placed  Patient s/p robotic assisted laparoscopic cholecystectomy. Doing well.   Diet and activity as tolerated.  Steristrips will peel off in the next 5-7 days. You can remove them once they are peeling off. It is ok to shower. Pat the area dry.   Algis Greenhouse, MD [facility] [address] [place], [place] [address] [phone] (office)

## 2022-12-17 ENCOUNTER — Encounter: Payer: Self-pay | Admitting: Internal Medicine

## 2022-12-18 ENCOUNTER — Other Ambulatory Visit: Payer: Self-pay | Admitting: Internal Medicine

## 2022-12-18 MED ORDER — NEXLIZET 180-10 MG PO TABS: 1.0000 | ORAL_TABLET | Freq: Every day | ORAL | 0 refills | Status: DC

## 2023-01-08 ENCOUNTER — Other Ambulatory Visit: Payer: Self-pay | Admitting: Nurse Practitioner

## 2023-01-08 DIAGNOSIS — E039 Hypothyroidism, unspecified: Secondary | ICD-10-CM

## 2023-01-13 ENCOUNTER — Ambulatory Visit (INDEPENDENT_AMBULATORY_CARE_PROVIDER_SITE_OTHER): Payer: Medicare PPO

## 2023-01-13 VITALS — Ht 68.0 in | Wt 157.0 lb

## 2023-01-13 DIAGNOSIS — Z0000 Encounter for general adult medical examination without abnormal findings: Secondary | ICD-10-CM | POA: Diagnosis not present

## 2023-01-13 NOTE — Progress Notes (Signed)
Subjective:   Peggy Smith is a 77 y.o. female who presents for Medicare Annual (Subsequent) preventive examination. I connected with  Peggy Smith on 01/13/23 by a audio enabled telemedicine application and verified that I am speaking with the correct person using two identifiers.  Patient Location: Home  Provider Location: Home Office  I discussed the limitations of evaluation and management by telemedicine. The patient expressed understanding and agreed to proceed.  Review of Systems     Cardiac Risk Factors include: advanced age (>12men, >48 women);dyslipidemia;hypertension     Objective:    Today's Vitals   01/13/23 1534  Weight: 157 lb (71.2 kg)  Height: 5\' 8"  (1.727 m)   Body mass index is 23.87 kg/m.     01/13/2023    3:39 PM 11/11/2022    6:35 AM 11/07/2022   11:41 AM 10/06/2022    5:37 AM 09/14/2022    8:05 AM 01/11/2022    2:59 PM 01/10/2021    1:09 PM  Advanced Directives  Does Patient Have a Medical Advance Directive? No No No No No Yes Yes  Type of Careers adviser;Living will Healthcare Power of Attorney  Copy of Healthcare Power of Attorney in Chart?      No - copy requested No - copy requested  Would patient like information on creating a medical advance directive? No - Patient declined No - Patient declined No - Patient declined No - Patient declined       Current Medications (verified) Outpatient Encounter Medications as of 01/13/2023  Medication Sig   amLODipine (NORVASC) 5 MG tablet TAKE 1 TABLET BY MOUTH EVERY DAY   aspirin 81 MG tablet Take 81 mg by mouth every morning.   atorvastatin (LIPITOR) 40 MG tablet Take 1 tablet (40 mg total) by mouth 2 (two) times a week. Take Peggy Smith (Patient taking differently: Take 40 mg by mouth 2 (two) times a week.)   Bempedoic Acid-Ezetimibe (NEXLIZET) 180-10 MG TABS Take 1 tablet by mouth daily.   calcium carbonate (OS-CAL) 600 MG TABS Take 600 mg by mouth every evening.    Cholecalciferol (VITAMIN D-3) 5000 UNITS TABS Take 5,000 Units by mouth every evening.   clonazePAM (KLONOPIN) 0.5 MG tablet Take 1 tablet (0.5 mg total) by mouth 2 (two) times daily as needed for anxiety. (Patient taking differently: Take 0.25 mg by mouth daily.)   escitalopram (LEXAPRO) 20 MG tablet TAKE 1 TABLET BY MOUTH EVERYDAY AT BEDTIME   fluticasone (FLONASE) 50 MCG/ACT nasal spray SPRAY 2 SPRAYS INTO EACH NOSTRIL EVERY DAY   ibuprofen (ADVIL) 200 MG tablet Take 400 mg by mouth every 6 (six) hours as needed for moderate pain.   levocetirizine (XYZAL) 5 MG tablet Take 5 mg by mouth every evening.   levothyroxine (SYNTHROID) 88 MCG tablet TAKE 1 TABLET BY MOUTH EVERY DAY   Magnesium 250 MG TABS Take 250 mg by mouth daily.   Multiple Vitamins-Minerals (CENTRUM SILVER PO) Take 1 tablet by mouth every morning.   omeprazole (PRILOSEC) 40 MG capsule Take 1 capsule (40 mg total) by mouth daily.   ondansetron (ZOFRAN) 4 MG tablet Take 1 tablet (4 mg total) by mouth every 8 (eight) hours as needed.   Riboflavin (B-2) 100 MG TABS Take 100 mg by mouth daily.   traMADol (ULTRAM) 50 MG tablet Take 1 tablet (50 mg total) by mouth every 6 (six) hours as needed for severe pain.   White Petrolatum (  VASELINE EX) Apply 1 Application topically daily as needed (cracked skin).   No facility-administered encounter medications on file as of 01/13/2023.    Allergies (verified) Crestor [rosuvastatin], Macrodantin, Morphine, Sulfa antibiotics, and Codeine   History: Past Medical History:  Diagnosis Date   Angina    STRESS TEST NORMAL-ANXIETY   Anxiety    Cardiac arrhythmia due to congenital heart disease    Depression    GERD (gastroesophageal reflux disease)    Headache(784.0)    OTC MEDS   Hepatitis     1976   Hyperlipidemia    DOES NOT TAKE MEDS - HAS THEM AT HOME BUT STATES DOES NOT TAKE   Hypertension    Hypothyroidism    Iron deficiency anemia    Melanoma (HCC)    Panic attacks    PONV  (postoperative nausea and vomiting)    Past Surgical History:  Procedure Laterality Date   ABDOMINAL HYSTERECTOMY     COLONOSCOPY     CYSTOSCOPY  11/08/2011   Procedure: CYSTOSCOPY;  Surgeon: Peggy Nails, MD;  Location: [facility];  Service: Gynecology;  Laterality: N/A;   DILATION AND CURETTAGE OF UTERUS     LAPAROSCOPIC HYSTERECTOMY  11/08/2011   Procedure: HYSTERECTOMY TOTAL LAPAROSCOPIC;  Surgeon: Peggy Nails, MD;  Location: [facility];  Service: Gynecology;  Laterality: N/A;  with Bilateral Salpingo-Oophorectomies   MELANOMA EXCISION     Left Shoulder   SVD      X 4   TUBAL LIGATION     UPPER GASTROINTESTINAL ENDOSCOPY     Family History  Problem Relation Age of Onset   Dementia Mother    Brain cancer Father    Anxiety disorder Brother    Asthma Son    Colon cancer Neg Hx    Breast cancer Neg Hx    Social History   Socioeconomic History   Marital status: Widowed    Spouse name: Peggy Smith   Number of children: 4   Years of education: Not on file   Highest education level: Master's degree (e.g., MA, MS, MEng, MEd, MSW, MBA)  Occupational History   Occupation: paralegal    Employer: [employer]    Comment: quit 2021 - thinking of starting back part time  Tobacco Use   Smoking status: Every Day    Packs/day: 0.25    Years: 30.00    Additional pack years: 0.00    Total pack years: 7.50    Types: Cigarettes   Smokeless tobacco: Never  Vaping Use   Vaping Use: Never used  Substance and Sexual Activity   Alcohol use: No   Drug use: No   Sexual activity: Yes    Birth control/protection: Surgical  Other Topics Concern   Not on file  Social History Narrative   Lives alone, but one son stays with her a lot - Daughter lives in [place], other children live nearby   Social Determinants of Health   Financial Resource Strain: Low Risk  (01/13/2023)   Overall Financial Resource Strain (CARDIA)    Difficulty of Paying Living Expenses: Not hard at all  Food  Insecurity: No Food Insecurity (01/13/2023)   Hunger Vital Sign    Worried About Running Out of Food in the Last Year: Never true    Ran Out of Food in the Last Year: Never true  Transportation Needs: No Transportation Needs (01/13/2023)   PRAPARE - Administrator, Civil Service (Medical): No    Lack of Transportation (Non-Medical):  No  Physical Activity: Insufficiently Active (01/11/2022)   Exercise Vital Sign    Days of Exercise per Week: 7 days    Minutes of Exercise per Session: 10 min  Stress: No Stress Concern Present (01/13/2023)   [employer] of Occupational Health - Occupational Stress Questionnaire    Feeling of Stress : Not at all  Social Connections: Moderately Isolated (01/13/2023)   Social Connection and Isolation Panel [NHANES]    Frequency of Communication with Friends and Family: More than three times a week    Frequency of Social Gatherings with Friends and Family: More than three times a week    Attends Religious Services: More than 4 times per year    Active Member of [employer] or Organizations: No    Attends Banker Meetings: Never    Marital Status: Widowed    Tobacco Counseling Ready to quit: No Counseling given: Not Answered   Clinical Intake:  Pre-visit preparation completed: Yes  Pain : No/denies pain     Nutritional Risks: None Diabetes: No  How often do you need to have someone help you when you read instructions, pamphlets, or other written materials from your doctor or pharmacy?: 1 - Never  Diabetic?no   Interpreter Needed?: No  Information entered by :: Renie Ora, LPN   Activities of Daily Living    01/13/2023    3:39 PM 11/07/2022   11:44 AM  In your present state of health, do you have any difficulty performing the following activities:  Hearing? 0   Vision? 0   Difficulty concentrating or making decisions? 0   Walking or climbing stairs? 0   Dressing or bathing? 0   Doing errands, shopping? 0 0   Preparing Food and eating ? N   Using the Toilet? N   In the past six months, have you accidently leaked urine? N   Do you have problems with loss of bowel control? N   Managing your Medications? N   Managing your Finances? N   Housekeeping or managing your Housekeeping? N     Patient Care Team: Bennie Pierini, FNP as PCP - General (Family Medicine) Christell Constant, MD as PCP - Cardiology (Cardiology)  Indicate any recent Medical Services you may have received from other than [facility] providers in the past year (date may be approximate).     Assessment:   This is a routine wellness examination for Che.  Hearing/Vision screen Vision Screening - Comments:: Wears rx glasses - up to date with routine eye exams with  [facility]   Dietary issues and exercise activities discussed: Current Exercise Habits: Home exercise routine, Type of exercise: walking, Time (Minutes): 30, Frequency (Times/Week): 3, Weekly Exercise (Minutes/Week): 90, Intensity: Mild   Goals Addressed             This Visit's Progress    DIET - EAT MORE FRUITS AND VEGETABLES   On track    Increase fruit and vegetables intake to 3-5 servings per day.        Depression Screen    01/13/2023    3:36 PM 10/21/2022   12:10 PM 04/22/2022   12:31 PM 01/11/2022    2:53 PM 10/15/2021   12:18 PM 04/16/2021   12:24 PM 01/10/2021    1:08 PM  PHQ 2/9 Scores  PHQ - 2 Score 0 0 0 2 0 0 3  PHQ- 9 Score  0 2 5 3 1 8     Fall Risk  01/13/2023    3:34 PM 10/21/2022   12:10 PM 04/22/2022   12:31 PM 01/11/2022    2:46 PM 10/15/2021   12:17 PM  Fall Risk   Falls in the past year? 0 0 1 1 1   Number falls in past yr: 0 0 0 0 0  Injury with Fall? 0 0 0 0 0  Risk for fall due to : No Fall Risks  History of fall(s) History of fall(s) History of fall(s)  Follow up Falls prevention discussed  Education provided Falls prevention discussed Education provided    FALL RISK PREVENTION PERTAINING TO THE HOME:  Any  stairs in or around the home? Yes  If so, are there any without handrails? No  Home free of loose throw rugs in walkways, pet beds, electrical cords, etc? Yes  Adequate lighting in your home to reduce risk of falls? Yes   ASSISTIVE DEVICES UTILIZED TO PREVENT FALLS:  Life alert? No  Use of a cane, walker or w/c? No  Grab bars in the bathroom? Yes  Shower chair or bench in shower? No  Elevated toilet seat or a handicapped toilet? No       06/29/2018    4:07 PM  MMSE - Mini Mental State Exam  Orientation to time 5  Orientation to Place 5  Registration 3  Attention/ Calculation 5  Recall 3  Language- name 2 objects 2  Language- repeat 1  Language- follow 3 step command 3  Language- read & follow direction 1  Write a sentence 1  Copy design 1  Total score 30        01/13/2023    3:39 PM 01/11/2022    2:58 PM 01/10/2020    1:49 PM  6CIT Screen  What Year? 0 points 0 points 0 points  What month? 0 points 0 points 0 points  What time? 0 points 0 points 0 points  Count back from 20 0 points 0 points 0 points  Months in reverse 0 points 0 points 0 points  Repeat phrase 0 points 0 points 0 points  Total Score 0 points 0 points 0 points    Immunizations Immunization History  Administered Date(s) Administered   Fluad Quad(high Dose 65+) 04/22/2022   Influenza-Unspecified 07/16/2011, 06/15/2012   Moderna SARS-COV2 Booster Vaccination 07/19/2020   Moderna Sars-Covid-2 Vaccination 09/09/2019, 10/11/2019, 07/19/2020   Tdap 11/09/2013    TDAP status: Up to date  Flu Vaccine status: Up to date  Pneumococcal vaccine status: Due, Education has been provided regarding the importance of this vaccine. Advised may receive this vaccine at local pharmacy or Health Dept. Aware to provide a copy of the vaccination record if obtained from local pharmacy or Health Dept. Verbalized acceptance and understanding.  Covid-19 vaccine status: Completed vaccines  Qualifies for Shingles Vaccine?  Yes   Zostavax completed No   Shingrix Completed?: No.    Education has been provided regarding the importance of this vaccine. Patient has been advised to call insurance company to determine out of pocket expense if they have not yet received this vaccine. Advised may also receive vaccine at local pharmacy or Health Dept. Verbalized acceptance and understanding.  Screening Tests Health Maintenance  Topic Date Due   COVID-19 Vaccine (4 - 2023-24 season) 04/05/2022   Zoster Vaccines- Shingrix (1 of 2) 01/21/2023 (Originally 05/05/1965)   MAMMOGRAM  04/23/2023 (Originally 03/12/2022)   Pneumonia Vaccine 43+ Years old (1 of 2 - PCV) 10/21/2023 (Originally 05/06/1952)   DEXA SCAN  01/31/2023  INFLUENZA VACCINE  03/06/2023   DTaP/Tdap/Td (2 - Td or Tdap) 11/10/2023   Medicare Annual Wellness (AWV)  01/13/2024   Hepatitis C Screening  Completed   HPV VACCINES  Aged Out   Colonoscopy  Discontinued    Health Maintenance  Health Maintenance Due  Topic Date Due   COVID-19 Vaccine (4 - 2023-24 season) 04/05/2022    Colorectal cancer screening: No longer required.   Mammogram status: No longer required due to age .  Bone Density status: Completed 01/30/2021/. Results reflect: Bone density results: OSTEOPOROSIS. Repeat every 2 years.  Lung Cancer Screening: (Low Dose CT Chest recommended if Age 55-80 years, 30 pack-year currently smoking OR have quit w/in 15years.) does not qualify.   Lung Cancer Screening Referral: n/a  Additional Screening:  Hepatitis C Screening: does not qualify; Completed  11/29/2016  Vision Screening: Recommended annual ophthalmology exams for early detection of glaucoma and other disorders of the eye. Is the patient up to date with their annual eye exam?  Yes  Who is the provider or what is the name of the office in which the patient attends annual eye exams? [facility]  If pt is not established with a provider, would they like to be referred to a provider to  establish care? No .   Dental Screening: Recommended annual dental exams for proper oral hygiene  Community Resource Referral / Chronic Care Management: CRR required this visit?  No   CCM required this visit?  No      Plan:     I have personally reviewed and noted the following in the patient's chart:   Medical and social history Use of alcohol, tobacco or illicit drugs  Current medications and supplements including opioid prescriptions. Patient is not currently taking opioid prescriptions. Functional ability and status Nutritional status Physical activity Advanced directives List of other physicians Hospitalizations, surgeries, and ER visits in previous 12 months Vitals Screenings to include cognitive, depression, and falls Referrals and appointments  In addition, I have reviewed and discussed with patient certain preventive protocols, quality metrics, and best practice recommendations. A written personalized care plan for preventive services as well as general preventive health recommendations were provided to patient.     Lorrene Reid, LPN   [id number]   Nurse Notes: none

## 2023-01-13 NOTE — Patient Instructions (Signed)
Peggy Smith , Thank you for taking time to come for your Medicare Wellness Visit. I appreciate your ongoing commitment to your health goals. Please review the following plan we discussed and let me know if I can assist you in the future.   These are the goals we discussed:  Goals      DIET - EAT MORE FRUITS AND VEGETABLES     Increase fruit and vegetables intake to 3-5 servings per day.      DIET - REDUCE FAST FOOD INTAKE     Exercise 3x per week (30 min per time)     She hopes to get the motivation to walk daily, get energy levels up and get out of her depression Update: 2023 - depression much better - still not motivated to start walking/ exercising        This is a list of the screening recommended for you and due dates:  Health Maintenance  Topic Date Due   COVID-19 Vaccine (4 - 2023-24 season) 04/05/2022   Zoster (Shingles) Vaccine (1 of 2) 01/21/2023*   Mammogram  04/23/2023*   Pneumonia Vaccine (1 of 2 - PCV) 10/21/2023*   DEXA scan (bone density measurement)  01/31/2023   Flu Shot  03/06/2023   DTaP/Tdap/Td vaccine (2 - Td or Tdap) 11/10/2023   Medicare Annual Wellness Visit  01/13/2024   Hepatitis C Screening  Completed   HPV Vaccine  Aged Out   Colon Cancer Screening  Discontinued  *Topic was postponed. The date shown is not the original due date.    Advanced directives: Advance directive discussed with you today. I have provided a copy for you to complete at home and have notarized. Once this is complete please bring a copy in to our office so we can scan it into your chart.   Conditions/risks identified: Aim for 30 minutes of exercise or brisk walking, 6-8 glasses of water, and 5 servings of fruits and vegetables each day.   Next appointment: Follow up in one year for your annual wellness visit    Preventive Care 65 Years and Older, Female Preventive care refers to lifestyle choices and visits with your health care provider that can promote health and  wellness. What does preventive care include? A yearly physical exam. This is also called an annual well check. Dental exams once or twice a year. Routine eye exams. Ask your health care provider how often you should have your eyes checked. Personal lifestyle choices, including: Daily care of your teeth and gums. Regular physical activity. Eating a healthy diet. Avoiding tobacco and drug use. Limiting alcohol use. Practicing safe sex. Taking low-dose aspirin every day. Taking vitamin and mineral supplements as recommended by your health care provider. What happens during an annual well check? The services and screenings done by your health care provider during your annual well check will depend on your age, overall health, lifestyle risk factors, and family history of disease. Counseling  Your health care provider may ask you questions about your: Alcohol use. Tobacco use. Drug use. Emotional well-being. Home and relationship well-being. Sexual activity. Eating habits. History of falls. Memory and ability to understand (cognition). Work and work Astronomer. Reproductive health. Screening  You may have the following tests or measurements: Height, weight, and BMI. Blood pressure. Lipid and cholesterol levels. These may be checked every 5 years, or more frequently if you are over 67 years old. Skin check. Lung cancer screening. You may have this screening every year starting at age 25  if you have a 30-pack-year history of smoking and currently smoke or have quit within the past 15 years. Fecal occult blood test (FOBT) of the stool. You may have this test every year starting at age 46. Flexible sigmoidoscopy or colonoscopy. You may have a sigmoidoscopy every 5 years or a colonoscopy every 10 years starting at age 26. Hepatitis C blood test. Hepatitis B blood test. Sexually transmitted disease (STD) testing. Diabetes screening. This is done by checking your blood sugar (glucose)  after you have not eaten for a while (fasting). You may have this done every 1-3 years. Bone density scan. This is done to screen for osteoporosis. You may have this done starting at age 24. Mammogram. This may be done every 1-2 years. Talk to your health care provider about how often you should have regular mammograms. Talk with your health care provider about your test results, treatment options, and if necessary, the need for more tests. Vaccines  Your health care provider may recommend certain vaccines, such as: Influenza vaccine. This is recommended every year. Tetanus, diphtheria, and acellular pertussis (Tdap, Td) vaccine. You may need a Td booster every 10 years. Zoster vaccine. You may need this after age 81. Pneumococcal 13-valent conjugate (PCV13) vaccine. One dose is recommended after age 29. Pneumococcal polysaccharide (PPSV23) vaccine. One dose is recommended after age 48. Talk to your health care provider about which screenings and vaccines you need and how often you need them. This information is not intended to replace advice given to you by your health care provider. Make sure you discuss any questions you have with your health care provider. Document Released: 08/18/2015 Document Revised: 04/10/2016 Document Reviewed: 05/23/2015 Elsevier Interactive Patient Education  2017 [employer] Prevention in the Home Falls can cause injuries. They can happen to people of all ages. There are many things you can do to make your home safe and to help prevent falls. What can I do on the outside of my home? Regularly fix the edges of walkways and driveways and fix any cracks. Remove anything that might make you trip as you walk through a door, such as a raised step or threshold. Trim any bushes or trees on the path to your home. Use bright outdoor lighting. Clear any walking paths of anything that might make someone trip, such as rocks or tools. Regularly check to see if  handrails are loose or broken. Make sure that both sides of any steps have handrails. Any raised decks and porches should have guardrails on the edges. Have any leaves, snow, or ice cleared regularly. Use sand or salt on walking paths during winter. Clean up any spills in your garage right away. This includes oil or grease spills. What can I do in the bathroom? Use night lights. Install grab bars by the toilet and in the tub and shower. Do not use towel bars as grab bars. Use non-skid mats or decals in the tub or shower. If you need to sit down in the shower, use a plastic, non-slip stool. Keep the floor dry. Clean up any water that spills on the floor as soon as it happens. Remove soap buildup in the tub or shower regularly. Attach bath mats securely with double-sided non-slip rug tape. Do not have throw rugs and other things on the floor that can make you trip. What can I do in the bedroom? Use night lights. Make sure that you have a light by your bed that is easy to reach. Do  not use any sheets or blankets that are too big for your bed. They should not hang down onto the floor. Have a firm chair that has side arms. You can use this for support while you get dressed. Do not have throw rugs and other things on the floor that can make you trip. What can I do in the kitchen? Clean up any spills right away. Avoid walking on wet floors. Keep items that you use a lot in easy-to-reach places. If you need to reach something above you, use a strong step stool that has a grab bar. Keep electrical cords out of the way. Do not use floor polish or wax that makes floors slippery. If you must use wax, use non-skid floor wax. Do not have throw rugs and other things on the floor that can make you trip. What can I do with my stairs? Do not leave any items on the stairs. Make sure that there are handrails on both sides of the stairs and use them. Fix handrails that are broken or loose. Make sure that  handrails are as long as the stairways. Check any carpeting to make sure that it is firmly attached to the stairs. Fix any carpet that is loose or worn. Avoid having throw rugs at the top or bottom of the stairs. If you do have throw rugs, attach them to the floor with carpet tape. Make sure that you have a light switch at the top of the stairs and the bottom of the stairs. If you do not have them, ask someone to add them for you. What else can I do to help prevent falls? Wear shoes that: Do not have high heels. Have rubber bottoms. Are comfortable and fit you well. Are closed at the toe. Do not wear sandals. If you use a stepladder: Make sure that it is fully opened. Do not climb a closed stepladder. Make sure that both sides of the stepladder are locked into place. Ask someone to hold it for you, if possible. Clearly mark and make sure that you can see: Any grab bars or handrails. First and last steps. Where the edge of each step is. Use tools that help you move around (mobility aids) if they are needed. These include: Canes. Walkers. Scooters. Crutches. Turn on the lights when you go into a dark area. Replace any light bulbs as soon as they burn out. Set up your furniture so you have a clear path. Avoid moving your furniture around. If any of your floors are uneven, fix them. If there are any pets around you, be aware of where they are. Review your medicines with your doctor. Some medicines can make you feel dizzy. This can increase your chance of falling. Ask your doctor what other things that you can do to help prevent falls. This information is not intended to replace advice given to you by your health care provider. Make sure you discuss any questions you have with your health care provider. Document Released: 05/18/2009 Document Revised: 12/28/2015 Document Reviewed: 08/26/2014 Elsevier Interactive Patient Education  2017 [employer].

## 2023-03-03 ENCOUNTER — Other Ambulatory Visit: Payer: Self-pay | Admitting: Nurse Practitioner

## 2023-03-03 ENCOUNTER — Encounter: Payer: Self-pay | Admitting: Physician Assistant

## 2023-03-03 ENCOUNTER — Ambulatory Visit: Payer: Medicare PPO | Attending: Physician Assistant | Admitting: Physician Assistant

## 2023-03-03 VITALS — BP 116/66 | HR 78 | Ht 68.0 in | Wt 160.0 lb

## 2023-03-03 DIAGNOSIS — I2510 Atherosclerotic heart disease of native coronary artery without angina pectoris: Secondary | ICD-10-CM | POA: Diagnosis not present

## 2023-03-03 DIAGNOSIS — I10 Essential (primary) hypertension: Secondary | ICD-10-CM | POA: Diagnosis not present

## 2023-03-03 DIAGNOSIS — Z720 Tobacco use: Secondary | ICD-10-CM | POA: Diagnosis not present

## 2023-03-03 DIAGNOSIS — E782 Mixed hyperlipidemia: Secondary | ICD-10-CM

## 2023-03-03 DIAGNOSIS — E785 Hyperlipidemia, unspecified: Secondary | ICD-10-CM

## 2023-03-03 MED ORDER — NEXLIZET 180-10 MG PO TABS: 1.0000 | ORAL_TABLET | Freq: Every day | ORAL | 3 refills | Status: DC

## 2023-03-03 NOTE — Patient Instructions (Addendum)
Medication Instructions:  Your physician recommends that you continue on your current medications as directed. Please refer to the Current Medication list given to you today.  *If you need a refill on your cardiac medications before your next appointment, please call your pharmacy*   Lab Work: Labs to be done with primary.  If you have labs (blood work) drawn today and your tests are completely normal, you will receive your results only by: MyChart Message (if you have MyChart) OR A paper copy in the mail If you have any lab test that is abnormal or we need to change your treatment, we will call you to review the results.   Testing/Procedures: None   Follow-Up: At [facility], you and your health needs are our priority.  As part of our continuing mission to provide you with exceptional heart care, we have created designated Provider Care Teams.  These Care Teams include your primary Cardiologist (physician) and Advanced Practice Providers (APPs -  Physician Assistants and Nurse Practitioners) who all work together to provide you with the care you need, when you need it.  We recommend signing up for the patient portal called "MyChart".  Sign up information is provided on this After Visit Summary.  MyChart is used to connect with patients for Virtual Visits (Telemedicine).  Patients are able to view lab/test results, encounter notes, upcoming appointments, etc.  Non-urgent messages can be sent to your provider as well.   To learn more about what you can do with MyChart, go to [email or web address].    Your next appointment:   1 year(s)  Provider:   Christell Constant, MD     Other Instructions Low-Sodium Eating Plan Sodium, which is an element that makes up salt, helps you maintain a healthy balance of fluids in your body. Too much sodium can increase your blood pressure and cause fluid and waste to be held in your body. Your health care provider or dietitian may  recommend following this plan if you have high blood pressure (hypertension), kidney disease, liver disease, or heart failure. Eating less sodium can help lower your blood pressure, reduce swelling, and protect your heart, liver, and kidneys. What are tips for following this plan? General guidelines Most people on this plan should limit their sodium intake to 1,500-2,000 mg (milligrams) of sodium each day. Reading food labels  The Nutrition Facts label lists the amount of sodium in one serving of the food. If you eat more than one serving, you must multiply the listed amount of sodium by the number of servings. Choose foods with less than 140 mg of sodium per serving. Avoid foods with 300 mg of sodium or more per serving. Shopping Look for lower-sodium products, often labeled as "low-sodium" or "no salt added." Always check the sodium content even if foods are labeled as "unsalted" or "no salt added". Buy fresh foods. Avoid canned foods and premade or frozen meals. Avoid canned, cured, or processed meats Buy breads that have less than 80 mg of sodium per slice. Cooking Eat more home-cooked food and less restaurant, buffet, and fast food. Avoid adding salt when cooking. Use salt-free seasonings or herbs instead of table salt or sea salt. Check with your health care provider or pharmacist before using salt substitutes. Cook with plant-based oils, such as canola, sunflower, or olive oil. Meal planning When eating at a restaurant, ask that your food be prepared with less salt or no salt, if possible. Avoid foods that contain MSG (monosodium glutamate). MSG  is sometimes added to [place] food, bouillon, and some canned foods. What foods are recommended? The items listed may not be a complete list. Talk with your dietitian about what dietary choices are best for you. Grains Low-sodium cereals, including oats, puffed wheat and rice, and shredded wheat. Low-sodium crackers. Unsalted rice. Unsalted  pasta. Low-sodium bread. Whole-grain breads and whole-grain pasta. Vegetables Fresh or frozen vegetables. "No salt added" canned vegetables. "No salt added" tomato sauce and paste. Low-sodium or reduced-sodium tomato and vegetable juice. Fruits Fresh, frozen, or canned fruit. Fruit juice. Meats and other protein foods Fresh or frozen (no salt added) meat, poultry, seafood, and fish. Low-sodium canned tuna and salmon. Unsalted nuts. Dried peas, beans, and lentils without added salt. Unsalted canned beans. Eggs. Unsalted nut butters. Dairy Milk. Soy milk. Cheese that is naturally low in sodium, such as ricotta cheese, fresh mozzarella, or Swiss cheese Low-sodium or reduced-sodium cheese. Cream cheese. Yogurt. Fats and oils Unsalted butter. Unsalted margarine with no trans fat. Vegetable oils such as canola or olive oils. Seasonings and other foods Fresh and dried herbs and spices. Salt-free seasonings. Low-sodium mustard and ketchup. Sodium-free salad dressing. Sodium-free light mayonnaise. Fresh or refrigerated horseradish. Lemon juice. Vinegar. Homemade, reduced-sodium, or low-sodium soups. Unsalted popcorn and pretzels. Low-salt or salt-free chips. What foods are not recommended? The items listed may not be a complete list. Talk with your dietitian about what dietary choices are best for you. Grains Instant hot cereals. Bread stuffing, pancake, and biscuit mixes. Croutons. Seasoned rice or pasta mixes. Noodle soup cups. Boxed or frozen macaroni and cheese. Regular salted crackers. Self-rising flour. Vegetables Sauerkraut, pickled vegetables, and relishes. Olives. [place] fries. Onion rings. Regular canned vegetables (not low-sodium or reduced-sodium). Regular canned tomato sauce and paste (not low-sodium or reduced-sodium). Regular tomato and vegetable juice (not low-sodium or reduced-sodium). Frozen vegetables in sauces. Meats and other protein foods Meat or fish that is salted, canned, smoked,  spiced, or pickled. Bacon, ham, sausage, hotdogs, corned beef, chipped beef, packaged lunch meats, salt pork, jerky, pickled herring, anchovies, regular canned tuna, sardines, salted nuts. Dairy Processed cheese and cheese spreads. Cheese curds. Blue cheese. Feta cheese. String cheese. Regular cottage cheese. Buttermilk. Canned milk. Fats and oils Salted butter. Regular margarine. Ghee. Bacon fat. Seasonings and other foods Onion salt, garlic salt, seasoned salt, table salt, and sea salt. Canned and packaged gravies. Worcestershire sauce. Tartar sauce. Barbecue sauce. Teriyaki sauce. Soy sauce, including reduced-sodium. Steak sauce. Fish sauce. Oyster sauce. Cocktail sauce. Horseradish that you find on the shelf. Regular ketchup and mustard. Meat flavorings and tenderizers. Bouillon cubes. Hot sauce and Tabasco sauce. Premade or packaged marinades. Premade or packaged taco seasonings. Relishes. Regular salad dressings. Salsa. Potato and tortilla chips. Corn chips and puffs. Salted popcorn and pretzels. Canned or dried soups. Pizza. Frozen entrees and pot pies. Summary Eating less sodium can help lower your blood pressure, reduce swelling, and protect your heart, liver, and kidneys. Most people on this plan should limit their sodium intake to 1,500-2,000 mg (milligrams) of sodium each day. Canned, boxed, and frozen foods are high in sodium. Restaurant foods, fast foods, and pizza are also very high in sodium. You also get sodium by adding salt to food. Try to cook at home, eat more fresh fruits and vegetables, and eat less fast food, canned, processed, or prepared foods. This information is not intended to replace advice given to you by your health care provider. Make sure you discuss any questions you have with your health care  provider. Document Revised: 07/04/2017 Document Reviewed: 07/15/2016 Elsevier Patient Education  2020 [employer] Inc[employer]   Heart-Healthy Eating Plan Many factors influence your  heart health, including eating and exercise habits. Heart health is also called coronary health. Coronary risk increases with abnormal blood fat (lipid) levels. A heart-healthy eating plan includes limiting unhealthy fats, increasing healthy fats, limiting salt (sodium) intake, and making other diet and lifestyle changes. What is my plan? Your health care provider may recommend that: You limit your fat intake to _________% or less of your total calories each day. You limit your saturated fat intake to _________% or less of your total calories each day. You limit the amount of cholesterol in your diet to less than _________ mg per day. You limit the amount of sodium in your diet to less than _________ mg per day. What are tips for following this plan? Cooking Cook foods using methods other than frying. Baking, boiling, grilling, and broiling are all good options. Other ways to reduce fat include: Removing the skin from poultry. Removing all visible fats from meats. Steaming vegetables in water or broth. Meal planning  At meals, imagine dividing your plate into fourths: Fill one-half of your plate with vegetables and green salads. Fill one-fourth of your plate with whole grains. Fill one-fourth of your plate with lean protein foods. Eat 2-4 cups of vegetables per day. One cup of vegetables equals 1 cup (91 g) broccoli or cauliflower florets, 2 medium carrots, 1 large bell pepper, 1 large sweet potato, 1 large tomato, 1 medium white potato, 2 cups (150 g) raw leafy greens. Eat 1-2 cups of fruit per day. One cup of fruit equals 1 small apple, 1 large banana, 1 cup (237 g) mixed fruit, 1 large orange,  cup (82 g) dried fruit, 1 cup (240 mL) 100% fruit juice. Eat more foods that contain soluble fiber. Examples include apples, broccoli, carrots, beans, peas, and barley. Aim to get 25-30 g of fiber per day. Increase your consumption of legumes, nuts, and seeds to 4-5 servings per week. One serving  of dried beans or legumes equals  cup (90 g) cooked, 1 serving of nuts is  oz (12 almonds, 24 pistachios, or 7 walnut halves), and 1 serving of seeds equals  oz (8 g). Fats Choose healthy fats more often. Choose monounsaturated and polyunsaturated fats, such as olive and canola oils, avocado oil, flaxseeds, walnuts, almonds, and seeds. Eat more omega-3 fats. Choose salmon, mackerel, sardines, tuna, flaxseed oil, and ground flaxseeds. Aim to eat fish at least 2 times each week. Check food labels carefully to identify foods with trans fats or high amounts of saturated fat. Limit saturated fats. These are found in animal products, such as meats, butter, and cream. Plant sources of saturated fats include palm oil, palm kernel oil, and coconut oil. Avoid foods with partially hydrogenated oils in them. These contain trans fats. Examples are stick margarine, some tub margarines, cookies, crackers, and other baked goods. Avoid fried foods. General information Eat more home-cooked food and less restaurant, buffet, and fast food. Limit or avoid alcohol. Limit foods that are high in added sugar and simple starches such as foods made using white refined flour (white breads, pastries, sweets). Lose weight if you are overweight. Losing just 5-10% of your body weight can help your overall health and prevent diseases such as diabetes and heart disease. Monitor your sodium intake, especially if you have high blood pressure. Talk with your health care provider about your sodium intake. Try  to incorporate more vegetarian meals weekly. What foods should I eat? Fruits All fresh, canned (in natural juice), or frozen fruits. Vegetables Fresh or frozen vegetables (raw, steamed, roasted, or grilled). Green salads. Grains Most grains. Choose whole wheat and whole grains most of the time. Rice and pasta, including brown rice and pastas made with whole wheat. Meats and other proteins Lean, well-trimmed beef, veal,  pork, and lamb. Chicken and [place] without skin. All fish and shellfish. Wild duck, rabbit, pheasant, and venison. Egg whites or low-cholesterol egg substitutes. Dried beans, peas, lentils, and tofu. Seeds and most nuts. Dairy Low-fat or nonfat cheeses, including ricotta and mozzarella. Skim or 1% milk (liquid, powdered, or evaporated). Buttermilk made with low-fat milk. Nonfat or low-fat yogurt. Fats and oils Non-hydrogenated (trans-free) margarines. Vegetable oils, including soybean, sesame, sunflower, olive, avocado, peanut, safflower, corn, canola, and cottonseed. Salad dressings or mayonnaise made with a vegetable oil. Beverages Water (mineral or sparkling). Coffee and tea. Unsweetened ice tea. Diet beverages. Sweets and desserts Sherbet, gelatin, and fruit ice. Small amounts of dark chocolate. Limit all sweets and desserts. Seasonings and condiments All seasonings and condiments. The items listed above may not be a complete list of foods and beverages you can eat. Contact a dietitian for more options. What foods should I avoid? Fruits Canned fruit in heavy syrup. Fruit in cream or butter sauce. Fried fruit. Limit coconut. Vegetables Vegetables cooked in cheese, cream, or butter sauce. Fried vegetables. Grains Breads made with saturated or trans fats, oils, or whole milk. Croissants. Sweet rolls. Donuts. High-fat crackers, such as cheese crackers and chips. Meats and other proteins Fatty meats, such as hot dogs, ribs, sausage, bacon, rib-eye roast or steak. High-fat deli meats, such as salami and bologna. Caviar. Domestic duck and goose. Organ meats, such as liver. Dairy Cream, sour cream, cream cheese, and creamed cottage cheese. Whole-milk cheeses. Whole or 2% milk (liquid, evaporated, or condensed). Whole buttermilk. Cream sauce or high-fat cheese sauce. Whole-milk yogurt. Fats and oils Meat fat, or shortening. Cocoa butter, hydrogenated oils, palm oil, coconut oil, palm kernel oil.  Solid fats and shortenings, including bacon fat, salt pork, lard, and butter. Nondairy cream substitutes. Salad dressings with cheese or sour cream. Beverages Regular sodas and any drinks with added sugar. Sweets and desserts Frosting. Pudding. Cookies. Cakes. Pies. Milk chocolate or white chocolate. Buttered syrups. Full-fat ice cream or ice cream drinks. The items listed above may not be a complete list of foods and beverages to avoid. Contact a dietitian for more information. Summary Heart-healthy meal planning includes limiting unhealthy fats, increasing healthy fats, limiting salt (sodium) intake and making other diet and lifestyle changes. Lose weight if you are overweight. Losing just 5-10% of your body weight can help your overall health and prevent diseases such as diabetes and heart disease. Focus on eating a balance of foods, including fruits and vegetables, low-fat or nonfat dairy, lean protein, nuts and legumes, whole grains, and heart-healthy oils and fats. This information is not intended to replace advice given to you by your health care provider. Make sure you discuss any questions you have with your health care provider. Document Revised: 08/27/2021 Document Reviewed: 08/27/2021 Elsevier Patient Education  2024 [employer].

## 2023-03-03 NOTE — Progress Notes (Signed)
Cardiology Office Note:  .   Date:  03/03/2023  ID:  Peggy Smith, DOB Dec 13, 1945, MRN [id number] PCP: Bennie Pierini, FNP  [facility] Providers Cardiologist:  Christell Constant, MD {  History of Present Illness: .   Peggy Smith is a 76 y.o. female with HTN, HLD with myalgias on atorvastatin 80 mg, tobacco abuse who presents today for follow-up appointment.  History of cardiac CT with mild nonobstructive CAD.  Started on Zetia 10 mg given to failed statins 12/17/2021.  Patient noted she was doing okay taking Nexlizet and paying $47 a month despite insurance.  No interval hospital/ED visits at that time.  No chest pain or pressure at follow-up visit 12/17/2021.  Was down from 7 cigarettes to 2 cigarettes a day.  No PND/orthopnea.  No weight gain or leg swelling.  No SOB or DOE.  Was able to tolerate atorvastatin 40 mg p.o. twice a week without symptoms.  Today, she tells me that she has not had any chest pain or SOB. She had her gallbladder out earlier this year and feels much better. She needs some follow-up labs but wants to do through her PCP. We recommended she fax these to our office. Otherwise, doing well from a CV standpoint. She lost her husband recently and it has been very hard for her.  Reports no shortness of breath nor dyspnea on exertion. Reports no chest pain, pressure, or tightness. No edema, orthopnea, PND. Reports no palpitations.   ROS: pertinent ROS in HPI  Studies Reviewed: .        Echo Stress Testing : Date:01/08/11 Results: Negative Stress Echo for ischemia   Cardiac CT: Date: 02/19/21 Results: IMPRESSION: 1. Coronary calcium score of 144. This was 66th percentile for age and sex matched control.   2. Normal coronary origin with right dominance.   3. Nonobstructive CAD   4. Mixed plaque in proximal LAD causes mild (25-49%) stenosis. High risk plaque features including positive remodeling, spotty calcification, and low  attenuation plaque   5. Calcified plaque causes minimal (0-24%) stenosis in the proximal RCA, mid LAD, and proximal LCX   6. 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 Mild non-obstructive CAD (25-49%). Consider non-atherosclerotic causes of chest pain. Consider preventive therapy and risk factor modification.       Physical Exam:   VS:  BP 116/66   Pulse 78   Ht 5\' 8"  (1.727 m)   Wt 160 lb (72.6 kg)   SpO2 95%   BMI 24.33 kg/m    Wt Readings from Last 3 Encounters:  03/03/23 160 lb (72.6 kg)  01/13/23 157 lb (71.2 kg)  11/26/22 163 lb (73.9 kg)    GEN: Well nourished, well developed in no acute distress NECK: No JVD; No carotid bruits CARDIAC: RRR, no murmurs, rubs, gallops RESPIRATORY:  Clear to auscultation without rales, wheezing or rhonchi  ABDOMEN: Soft, non-tender, non-distended EXTREMITIES:  No edema; No deformity   ASSESSMENT AND PLAN: .   1.  Coronary artery disease -no chest pain or SOB -continue current medications -nexlizet 90 day supply added today -she is on a grant for the medication, will set up follow-up with PharmD to renew when applicable   2.  Mixed hyperlipidemia -continue lipitor 40mg  twice a week and Nexlizet daily.  -recent LDL 41, excellent  3.  Primary hypertension -well controlled today, low normal -asymptomatic, continue current medications  4.  Tobacco abuse   -6-8 a day, lost her  husband and it has been very difficult for her -cessation advised when ready    Dispo: She can follow-up in a year  Signed, Sharlene Dory, PA-C

## 2023-03-25 DIAGNOSIS — H43393 Other vitreous opacities, bilateral: Secondary | ICD-10-CM | POA: Diagnosis not present

## 2023-04-05 ENCOUNTER — Other Ambulatory Visit: Payer: Self-pay | Admitting: Nurse Practitioner

## 2023-04-05 DIAGNOSIS — J301 Allergic rhinitis due to pollen: Secondary | ICD-10-CM

## 2023-04-17 ENCOUNTER — Other Ambulatory Visit: Payer: Self-pay | Admitting: Nurse Practitioner

## 2023-04-17 DIAGNOSIS — F410 Panic disorder [episodic paroxysmal anxiety] without agoraphobia: Secondary | ICD-10-CM

## 2023-04-22 ENCOUNTER — Encounter: Payer: Self-pay | Admitting: Nurse Practitioner

## 2023-04-22 ENCOUNTER — Other Ambulatory Visit: Payer: Medicare PPO

## 2023-04-22 ENCOUNTER — Ambulatory Visit (INDEPENDENT_AMBULATORY_CARE_PROVIDER_SITE_OTHER): Payer: Medicare PPO | Admitting: Nurse Practitioner

## 2023-04-22 VITALS — BP 114/67 | HR 86 | Temp 97.8°F | Resp 20 | Ht 68.0 in | Wt 157.0 lb

## 2023-04-22 DIAGNOSIS — F410 Panic disorder [episodic paroxysmal anxiety] without agoraphobia: Secondary | ICD-10-CM | POA: Diagnosis not present

## 2023-04-22 DIAGNOSIS — Z23 Encounter for immunization: Secondary | ICD-10-CM | POA: Diagnosis not present

## 2023-04-22 DIAGNOSIS — K219 Gastro-esophageal reflux disease without esophagitis: Secondary | ICD-10-CM | POA: Diagnosis not present

## 2023-04-22 DIAGNOSIS — E782 Mixed hyperlipidemia: Secondary | ICD-10-CM

## 2023-04-22 DIAGNOSIS — Z6829 Body mass index (BMI) 29.0-29.9, adult: Secondary | ICD-10-CM

## 2023-04-22 DIAGNOSIS — I10 Essential (primary) hypertension: Secondary | ICD-10-CM

## 2023-04-22 DIAGNOSIS — D509 Iron deficiency anemia, unspecified: Secondary | ICD-10-CM

## 2023-04-22 DIAGNOSIS — E039 Hypothyroidism, unspecified: Secondary | ICD-10-CM

## 2023-04-22 DIAGNOSIS — I2510 Atherosclerotic heart disease of native coronary artery without angina pectoris: Secondary | ICD-10-CM | POA: Diagnosis not present

## 2023-04-22 DIAGNOSIS — F411 Generalized anxiety disorder: Secondary | ICD-10-CM | POA: Diagnosis not present

## 2023-04-22 DIAGNOSIS — Z720 Tobacco use: Secondary | ICD-10-CM | POA: Diagnosis not present

## 2023-04-22 DIAGNOSIS — D691 Qualitative platelet defects: Secondary | ICD-10-CM

## 2023-04-22 MED ORDER — AMLODIPINE BESYLATE 5 MG PO TABS
ORAL_TABLET | ORAL | 1 refills | Status: DC
Start: 2023-04-22 — End: 2023-10-20

## 2023-04-22 MED ORDER — ATORVASTATIN CALCIUM 40 MG PO TABS
40.0000 mg | ORAL_TABLET | ORAL | 0 refills | Status: DC
Start: 2023-04-24 — End: 2023-10-20

## 2023-04-22 MED ORDER — LEVOTHYROXINE SODIUM 88 MCG PO TABS
88.0000 ug | ORAL_TABLET | Freq: Every day | ORAL | 1 refills | Status: DC
Start: 2023-04-22 — End: 2023-10-20

## 2023-04-22 MED ORDER — OMEPRAZOLE 40 MG PO CPDR: 40.0000 mg | DELAYED_RELEASE_CAPSULE | Freq: Every day | ORAL | 1 refills | Status: DC

## 2023-04-22 MED ORDER — ESCITALOPRAM OXALATE 20 MG PO TABS
ORAL_TABLET | ORAL | 1 refills | Status: DC
Start: 2023-04-22 — End: 2023-10-20

## 2023-04-22 MED ORDER — CLONAZEPAM 0.5 MG PO TABS
0.5000 mg | ORAL_TABLET | Freq: Two times a day (BID) | ORAL | 5 refills | Status: DC | PRN
Start: 2023-04-22 — End: 2023-10-20

## 2023-04-22 NOTE — Progress Notes (Signed)
Subjective:    Patient ID: Peggy Smith, female    DOB: [birth year], 77 y.o.   MRN: [id number]   Chief Complaint: medical management of chronic issues     HPI:  Peggy Smith is a 77 y.o. who identifies as a female who was assigned female at birth.   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 Primary hypertension No c/o chest pain, sob or headache. Does not check blood pressure at home. BP Readings from Last 3 Encounters:  03/03/23 116/66  11/26/22 123/78  11/11/22 126/65     2. Coronary artery disease involving native coronary artery of native heart without angina pectoris Last saw cardiology on 03/03/23. Review of office note revealed no change in plan of care.  3. Mixed hyperlipidemia Doe snot really watch diet and does no exercise at all. Lab Results  Component Value Date   CHOL 107 04/22/2022   HDL 44 04/22/2022   LDLCALC 41 04/22/2022   TRIG 122 04/22/2022   CHOLHDL 2.4 04/22/2022     4. Gastroesophageal reflux disease without esophagitis Is on omeprazole and is doing well.  5. Iron deficiency anemia, unspecified iron deficiency anemia type No c/o fatigue Lab Results  Component Value Date   HGB 11.7 (L) 10/06/2022     6. Anxiety state Is on klonopin bid. Stays anxious    10/21/2022   12:10 PM 04/22/2022   12:31 PM 10/15/2021   12:18 PM 04/16/2021   12:25 PM  GAD 7 : Generalized Anxiety Score  Nervous, Anxious, on Edge 0 0 0 0  Control/stop worrying 0 0 0 0  Worry too much - different things 0 0 0 0  Trouble relaxing 0 0 0 1  Restless 0 0 0 0  Easily annoyed or irritable 0 0 0 0  Afraid - awful might happen 0 0 0 0  Total GAD 7 Score 0 0 0 1  Anxiety Difficulty Not difficult at all Not difficult at all Not difficult at all Not difficult at all      7. Panic attacks Is on lexapro and is doing  well.    01/13/2023    3:36 PM 10/21/2022   12:10 PM 04/22/2022   12:31 PM  Depression screen PHQ 2/9  Decreased Interest 0 0 0  Down, Depressed, Hopeless 0 0 0  PHQ - 2 Score 0 0 0  Altered sleeping  0 1  Tired, decreased energy  0 1  Change in appetite   0  Feeling bad or failure about yourself   0 0  Trouble concentrating  0 0  Moving slowly or fidgety/restless  0 0  Suicidal thoughts  0 0  PHQ-9 Score  0 2  Difficult doing work/chores   Not difficult at all     8. Tobacco abuse Smokes at least a pack a day  9. BMI 29.0-29.9,adult No recent weight changes Wt Readings from Last 3 Encounters:  04/22/23 157 lb (71.2 kg)  03/03/23 160 lb (72.6 kg)  01/13/23 157 lb (71.2 kg)   BMI Readings from Last 3 Encounters:  04/22/23 23.87 kg/m  03/03/23 24.33 kg/m  01/13/23 23.87 kg/m      New complaints: None today  Allergies  Allergen Reactions   Crestor [Rosuvastatin]     Muscle aches   Macrodantin Other (See Comments)  Liver shut down   Morphine Nausea And Vomiting   Sulfa Antibiotics Other (See Comments)    agitation   Codeine Nausea And Vomiting        Outpatient Encounter Medications as of 04/22/2023  Medication Sig   amLODipine (NORVASC) 5 MG tablet TAKE 1 TABLET BY MOUTH EVERY DAY   aspirin 81 MG tablet Take 81 mg by mouth every morning.   atorvastatin (LIPITOR) 40 MG tablet TAKE 1 TABLET (40 MG TOTAL) BY MOUTH 2 (TWO) TIMES A WEEK. TAKE MON, THUR   Bempedoic Acid-Ezetimibe (NEXLIZET) 180-10 MG TABS Take 1 tablet by mouth daily.   calcium carbonate (OS-CAL) 600 MG TABS Take 600 mg by mouth every evening.   Cholecalciferol (VITAMIN D-3) 5000 UNITS TABS Take 5,000 Units by mouth every evening.   clonazePAM (KLONOPIN) 0.5 MG tablet Take 1 tablet (0.5 mg total) by mouth 2 (two) times daily as needed for anxiety. (Patient taking differently: Take 0.25 mg by mouth daily.)   escitalopram (LEXAPRO) 20 MG tablet TAKE 1 TABLET BY MOUTH EVERYDAY AT BEDTIME    fluticasone (FLONASE) 50 MCG/ACT nasal spray SPRAY 2 SPRAYS INTO EACH NOSTRIL EVERY DAY   ibuprofen (ADVIL) 200 MG tablet Take 400 mg by mouth every 6 (six) hours as needed for moderate pain.   levothyroxine (SYNTHROID) 88 MCG tablet TAKE 1 TABLET BY MOUTH EVERY DAY   Magnesium 250 MG TABS Take 250 mg by mouth daily.   Multiple Vitamins-Minerals (CENTRUM SILVER PO) Take 1 tablet by mouth every morning.   omeprazole (PRILOSEC) 40 MG capsule Take 1 capsule (40 mg total) by mouth daily.   Riboflavin (B-2) 100 MG TABS Take 100 mg by mouth daily.   White Petrolatum (VASELINE EX) Apply 1 Application topically daily as needed (cracked skin).   [DISCONTINUED] escitalopram (LEXAPRO) 20 MG tablet TAKE 1 TABLET BY MOUTH EVERYDAY AT BEDTIME   No facility-administered encounter medications on file as of 04/22/2023.    Past Surgical History:  Procedure Laterality Date   ABDOMINAL HYSTERECTOMY     COLONOSCOPY     CYSTOSCOPY  11/08/2011   Procedure: CYSTOSCOPY;  Surgeon: Purcell Nails, MD;  Location: [facility];  Service: Gynecology;  Laterality: N/A;   DILATION AND CURETTAGE OF UTERUS     LAPAROSCOPIC HYSTERECTOMY  11/08/2011   Procedure: HYSTERECTOMY TOTAL LAPAROSCOPIC;  Surgeon: Purcell Nails, MD;  Location: [facility];  Service: Gynecology;  Laterality: N/A;  with Bilateral Salpingo-Oophorectomies   MELANOMA EXCISION     Left Shoulder   SVD      X 4   TUBAL LIGATION     UPPER GASTROINTESTINAL ENDOSCOPY      Family History  Problem Relation Age of Onset   Dementia Mother    Brain cancer Father    Anxiety disorder Brother    Asthma Son    Colon cancer Neg Hx    Breast cancer Neg Hx       Controlled substance contract: n/a     Review of Systems  Constitutional:  Negative for diaphoresis.  Eyes:  Negative for pain.  Respiratory:  Negative for shortness of breath.   Cardiovascular:  Negative for chest pain, palpitations and leg swelling.  Gastrointestinal:  Negative for abdominal  pain.  Endocrine: Negative for polydipsia.  Skin:  Negative for rash.  Neurological:  Negative for dizziness, weakness and headaches.  Hematological:  Does not bruise/bleed easily.  All other systems reviewed and are negative.      Objective:   Physical Exam  Vitals and nursing note reviewed.  Constitutional:      General: She is not in acute distress.    Appearance: Normal appearance. She is well-developed.  HENT:     Head: Normocephalic.     Right Ear: Tympanic membrane normal.     Left Ear: Tympanic membrane normal.     Nose: Nose normal.     Mouth/Throat:     Mouth: Mucous membranes are moist.  Eyes:     Pupils: Pupils are equal, round, and reactive to light.  Neck:     Vascular: No carotid bruit or JVD.  Cardiovascular:     Rate and Rhythm: Normal rate and regular rhythm.     Heart sounds: Normal heart sounds.  Pulmonary:     Effort: Pulmonary effort is normal. No respiratory distress.     Breath sounds: Normal breath sounds. No wheezing or rales.  Chest:     Chest wall: No tenderness.  Abdominal:     General: Bowel sounds are normal. There is no distension or abdominal bruit.     Palpations: Abdomen is soft. There is no hepatomegaly, splenomegaly, mass or pulsatile mass.     Tenderness: There is no abdominal tenderness.  Musculoskeletal:        General: Normal range of motion.     Cervical back: Normal range of motion and neck supple.  Lymphadenopathy:     Cervical: No cervical adenopathy.  Skin:    General: Skin is warm and dry.  Neurological:     Mental Status: She is alert and oriented to person, place, and time.     Deep Tendon Reflexes: Reflexes are normal and symmetric.  Psychiatric:        Behavior: Behavior normal.        Thought Content: Thought content normal.        Judgment: Judgment normal.     BP 114/67   Pulse 86   Temp 97.8 F (36.6 C) (Temporal)   Resp 20   Ht 5\' 8"  (1.727 m)   Wt 157 lb (71.2 kg)   SpO2 98%   BMI 23.87 kg/m         Assessment & Plan:   Peggy Smith comes in today with chief complaint of Medical Management of Chronic Issues   Diagnosis and orders addressed:  1. Primary hypertension Low sodium diet - amLODipine (NORVASC) 5 MG tablet; TAKE 1 TABLET BY MOUTH EVERY DAY  Dispense: 90 tablet; Refill: 1  2. Coronary artery disease involving native coronary artery of native heart without angina pectoris Keep follow up with cardiology  3. Mixed hyperlipidemia Low fat diet - atorvastatin (LIPITOR) 40 MG tablet; Take 1 tablet (40 mg total) by mouth 2 (two) times a week. Take Nance Pew  Dispense: 90 tablet; Refill: 0  4. Gastroesophageal reflux disease without esophagitis Avoid spicy foods Do not eat 2 hours prior to bedtime  - omeprazole (PRILOSEC) 40 MG capsule; Take 1 capsule (40 mg total) by mouth daily.  Dispense: 90 capsule; Refill: 1  5. Iron deficiency anemia, unspecified iron deficiency anemia type Labs pending  6. Anxiety state Stress management - ToxASSURE Select 13 (MW), Urine - clonazePAM (KLONOPIN) 0.5 MG tablet; Take 1 tablet (0.5 mg total) by mouth 2 (two) times daily as needed for anxiety.  Dispense: 60 tablet; Refill: 5  7. Panic attacks - escitalopram (LEXAPRO) 20 MG tablet; TAKE 1 TABLET BY MOUTH EVERYDAY AT BEDTIME  Dispense: 90 tablet; Refill: 1  8. Tobacco abuse Smoking  cessation encouraged  9. BMI 29.0-29.9,adult Discussed diet and exercise for person with BMI >25 Will recheck weight in 3-6 months   10. Acquired hypothyroidism Labs pending - levothyroxine (SYNTHROID) 88 MCG tablet; Take 1 tablet (88 mcg total) by mouth daily.  Dispense: 90 tablet; Refill: 1   Labs pending Health Maintenance reviewed Diet and exercise encouraged  Follow up plan: 6 months   [name] [name], FNP

## 2023-04-23 LAB — CMP14+EGFR
ALT: 10 IU/L (ref 0–32)
AST: 15 IU/L (ref 0–40)
Albumin: 4.2 g/dL (ref 3.8–4.8)
Alkaline Phosphatase: 85 IU/L (ref 44–121)
BUN/Creatinine Ratio: 16 (ref 12–28)
BUN: 15 mg/dL (ref 8–27)
Bilirubin Total: 0.2 mg/dL (ref 0.0–1.2)
CO2: 28 mmol/L (ref 20–29)
Calcium: 10 mg/dL (ref 8.7–10.3)
Chloride: 99 mmol/L (ref 96–106)
Creatinine, Ser: 0.92 mg/dL (ref 0.57–1.00)
Globulin, Total: 2.8 g/dL (ref 1.5–4.5)
Glucose: 101 mg/dL — ABNORMAL HIGH (ref 70–99)
Potassium: 4.4 mmol/L (ref 3.5–5.2)
Sodium: 139 mmol/L (ref 134–144)
Total Protein: 7 g/dL (ref 6.0–8.5)
eGFR: 64 mL/min/{1.73_m2} (ref >59)

## 2023-04-23 LAB — CBC WITH DIFFERENTIAL/PLATELET
Basophils Absolute: 0.1 10*3/uL (ref 0.0–0.2)
Basos: 1 %
EOS (ABSOLUTE): 0.1 10*3/uL (ref 0.0–0.4)
Eos: 2 %
Hematocrit: 41.7 % (ref 34.0–46.6)
Hemoglobin: 13.1 g/dL (ref 11.1–15.9)
Immature Grans (Abs): 0 10*3/uL (ref 0.0–0.1)
Immature Granulocytes: 0 %
Lymphocytes Absolute: 2.5 10*3/uL (ref 0.7–3.1)
Lymphs: 33 %
MCH: 28.4 pg (ref 26.6–33.0)
MCHC: 31.4 g/dL — ABNORMAL LOW (ref 31.5–35.7)
MCV: 91 fL (ref 79–97)
Monocytes Absolute: 0.6 10*3/uL (ref 0.1–0.9)
Monocytes: 8 %
Neutrophils Absolute: 4.3 10*3/uL (ref 1.4–7.0)
Neutrophils: 56 %
Platelets: 579 10*3/uL — ABNORMAL HIGH (ref 150–450)
RBC: 4.61 x10E6/uL (ref 3.77–5.28)
RDW: 12.8 % (ref 11.7–15.4)
WBC: 7.6 10*3/uL (ref 3.4–10.8)

## 2023-04-23 LAB — LIPID PANEL
Chol/HDL Ratio: 2.5 ratio (ref 0.0–4.4)
Cholesterol, Total: 95 mg/dL — ABNORMAL LOW (ref 100–199)
HDL: 38 mg/dL — ABNORMAL LOW (ref >39)
LDL Chol Calc (NIH): 30 mg/dL (ref 0–99)
Triglycerides: 164 mg/dL — ABNORMAL HIGH (ref 0–149)
VLDL Cholesterol Cal: 27 mg/dL (ref 5–40)

## 2023-04-23 LAB — THYROID PANEL WITH TSH
Free Thyroxine Index: 2.7 (ref 1.2–4.9)
T3 Uptake Ratio: 28 % (ref 24–39)
T4, Total: 9.5 ug/dL (ref 4.5–12.0)
TSH: 1.77 u[IU]/mL (ref 0.450–4.500)

## 2023-04-24 LAB — TOXASSURE SELECT 13 (MW), URINE

## 2023-04-25 NOTE — Addendum Note (Signed)
Addended by: Bennie Pierini on: 04/25/2023 07:44 AM   Modules accepted: Orders

## 2023-05-28 ENCOUNTER — Encounter: Payer: Self-pay | Admitting: Oncology

## 2023-05-28 ENCOUNTER — Other Ambulatory Visit: Payer: Self-pay

## 2023-05-28 ENCOUNTER — Inpatient Hospital Stay: Payer: Medicare PPO | Attending: Oncology | Admitting: Oncology

## 2023-05-28 ENCOUNTER — Inpatient Hospital Stay: Payer: Medicare PPO

## 2023-05-28 VITALS — BP 146/72 | HR 91 | Temp 98.0°F | Resp 18 | Ht 68.0 in | Wt 160.8 lb

## 2023-05-28 DIAGNOSIS — E039 Hypothyroidism, unspecified: Secondary | ICD-10-CM | POA: Diagnosis not present

## 2023-05-28 DIAGNOSIS — Z720 Tobacco use: Secondary | ICD-10-CM

## 2023-05-28 DIAGNOSIS — E785 Hyperlipidemia, unspecified: Secondary | ICD-10-CM | POA: Diagnosis not present

## 2023-05-28 DIAGNOSIS — Z79899 Other long term (current) drug therapy: Secondary | ICD-10-CM | POA: Insufficient documentation

## 2023-05-28 DIAGNOSIS — I10 Essential (primary) hypertension: Secondary | ICD-10-CM | POA: Insufficient documentation

## 2023-05-28 DIAGNOSIS — D75839 Thrombocytosis, unspecified: Secondary | ICD-10-CM | POA: Diagnosis not present

## 2023-05-28 DIAGNOSIS — D509 Iron deficiency anemia, unspecified: Secondary | ICD-10-CM | POA: Insufficient documentation

## 2023-05-28 DIAGNOSIS — F17210 Nicotine dependence, cigarettes, uncomplicated: Secondary | ICD-10-CM | POA: Insufficient documentation

## 2023-05-28 DIAGNOSIS — F419 Anxiety disorder, unspecified: Secondary | ICD-10-CM | POA: Diagnosis not present

## 2023-05-28 LAB — FERRITIN: Ferritin: 15 ng/mL (ref 11–307)

## 2023-05-28 LAB — IRON AND TIBC
Iron: 82 ug/dL (ref 28–170)
Saturation Ratios: 19 % (ref 10.4–31.8)
TIBC: 436 ug/dL (ref 250–450)
UIBC: 354 ug/dL

## 2023-05-28 LAB — CBC WITH DIFFERENTIAL/PLATELET
Abs Immature Granulocytes: 0.03 10*3/uL (ref 0.00–0.07)
Basophils Absolute: 0.1 10*3/uL (ref 0.0–0.1)
Basophils Relative: 1 %
Eosinophils Absolute: 0.2 10*3/uL (ref 0.0–0.5)
Eosinophils Relative: 3 %
HCT: 40.5 % (ref 36.0–46.0)
Hemoglobin: 13.5 g/dL (ref 12.0–15.0)
Immature Granulocytes: 0 %
Lymphocytes Relative: 34 %
Lymphs Abs: 2.5 10*3/uL (ref 0.7–4.0)
MCH: 29.6 pg (ref 26.0–34.0)
MCHC: 33.3 g/dL (ref 30.0–36.0)
MCV: 88.8 fL (ref 80.0–100.0)
Monocytes Absolute: 0.7 10*3/uL (ref 0.1–1.0)
Monocytes Relative: 9 %
Neutro Abs: 4 10*3/uL (ref 1.7–7.7)
Neutrophils Relative %: 53 %
Platelets: 420 10*3/uL — ABNORMAL HIGH (ref 150–400)
RBC: 4.56 MIL/uL (ref 3.87–5.11)
RDW: 14.3 % (ref 11.5–15.5)
WBC: 7.4 10*3/uL (ref 4.0–10.5)
nRBC: 0 % (ref 0.0–0.2)

## 2023-05-28 LAB — C-REACTIVE PROTEIN: CRP: 1.2 mg/dL — ABNORMAL HIGH (ref <1.0)

## 2023-05-28 LAB — SEDIMENTATION RATE: Sed Rate: 9 mm/h (ref 0–22)

## 2023-05-28 NOTE — Progress Notes (Signed)
[facility] HEMATOLOGY NEW VISIT  Bennie Pierini, FNP  REASON FOR REFERRAL: Thrombocytosis  SUMMARY OF HEMATOLOGIC HISTORY:  Latest Reference Range & Units 05/17/11 12:15 10/31/11 11:20 11/08/11 18:18 11/09/11 05:31 07/08/13 16:52 11/29/16 11:41 06/06/17 12:13 12/05/17 12:31 12/22/19 12:39 06/23/20 11:14 01/02/21 12:53 04/16/21 13:01 10/18/21 08:51 04/22/22 14:11 09/14/22 08:11 10/06/22 05:41 04/22/23 14:39  Platelets 150 - 450 x10E3/uL 450 (H) 441 (H) 352 326 399 (H) 411 (H) 460 (H) 433 (H) 397 425 407 417 422 428 415 (H) 500 (H) 579 (H)  (H): Data is abnormally high   HISTORY OF PRESENT ILLNESS: Peggy Smith 77 y.o. female referred for thrombocytosis.  She is accompanied by her daughter-in-law today.  The past medical history of hypertension, anxiety, hyperlipidemia, hypothyroidism.  The patient has had high platelet counts [higher limits of normal] for the past 12 years, with recent counts being slightly higher than usual. The patient does not report any symptoms related to the high platelet counts. Earlier in the year, the patient had her gallbladder removed due to complications, which required multiple trips to the ER. Following the gallbladder removal, the patient was slightly anemic and started taking iron supplements daily. The patient reports occasional constipation, which she attributes to the iron supplements.  She denies fatigue, rash, pruritus after hot shower, night sweats, weight loss.  The patient is a smoker and has been smoking for the past 30 years.  Patient denies alcohol use.  No history of cancer in the family or blood conditions.   I have reviewed the past medical history, past surgical history, social history and family history with the patient   ALLERGIES:  is allergic to crestor [rosuvastatin], macrodantin, morphine, sulfa antibiotics, and codeine.  MEDICATIONS:  Current Outpatient Medications  Medication Sig  Dispense Refill   amLODipine (NORVASC) 5 MG tablet TAKE 1 TABLET BY MOUTH EVERY DAY 90 tablet 1   aspirin 81 MG tablet Take 81 mg by mouth every morning.     atorvastatin (LIPITOR) 40 MG tablet Take 1 tablet (40 mg total) by mouth 2 (two) times a week. Take Mon, Thur 90 tablet 0   Bempedoic Acid-Ezetimibe (NEXLIZET) 180-10 MG TABS Take 1 tablet by mouth daily. 90 tablet 3   calcium carbonate (OS-CAL) 600 MG TABS Take 600 mg by mouth every evening.     Cholecalciferol (VITAMIN D-3) 5000 UNITS TABS Take 5,000 Units by mouth every evening.     clonazePAM (KLONOPIN) 0.5 MG tablet Take 1 tablet (0.5 mg total) by mouth 2 (two) times daily as needed for anxiety. 60 tablet 5   escitalopram (LEXAPRO) 20 MG tablet TAKE 1 TABLET BY MOUTH EVERYDAY AT BEDTIME 90 tablet 1   Ferrous Sulfate (IRON) 325 (65 Fe) MG TABS Take by mouth.     fluticasone (FLONASE) 50 MCG/ACT nasal spray SPRAY 2 SPRAYS INTO EACH NOSTRIL EVERY DAY 48 mL 1   ibuprofen (ADVIL) 200 MG tablet Take 400 mg by mouth every 6 (six) hours as needed for moderate pain.     levothyroxine (SYNTHROID) 88 MCG tablet Take 1 tablet (88 mcg total) by mouth daily. 90 tablet 1   Magnesium 250 MG TABS Take 250 mg by mouth daily.     Multiple Vitamins-Minerals (CENTRUM SILVER PO) Take 1 tablet by mouth every morning.     omeprazole (PRILOSEC) 40 MG capsule Take 1 capsule (40 mg total) by mouth daily. 90 capsule 1   Riboflavin (B-2) 100 MG TABS Take 100 mg  by mouth daily.     White Petrolatum (VASELINE EX) Apply 1 Application topically daily as needed (cracked skin).     No current facility-administered medications for this visit.     REVIEW OF SYSTEMS:   Constitutional: Denies fevers, chills or night sweats Eyes: Denies blurriness of vision Ears, nose, mouth, throat, and face: Denies mucositis or sore throat Respiratory: Denies cough, dyspnea or wheezes Cardiovascular: Denies palpitation, chest discomfort or lower extremity swelling Gastrointestinal:   Denies nausea, heartburn or change in bowel habits Skin: Denies abnormal skin rashes Lymphatics: Denies new lymphadenopathy or easy bruising Neurological:Denies numbness, tingling or new weaknesses Behavioral/Psych: Mood is stable, no new changes  All other systems were reviewed with the patient and are negative.  PHYSICAL EXAMINATION:   Vitals:   05/28/23 1429  BP: (!) 146/72  Pulse: 91  Resp: 18  Temp: 98 F (36.7 C)  SpO2: 95%    GENERAL:alert, no distress and comfortable SKIN: skin color, texture, turgor are normal, no rashes or significant lesions LUNGS: clear to auscultation and percussion with normal breathing effort HEART: regular rate & rhythm and no murmurs and no lower extremity edema ABDOMEN:abdomen soft, non-tender and normal bowel sounds Musculoskeletal:no cyanosis of digits and no clubbing  NEURO: alert & oriented x 3 with fluent speech  LABORATORY DATA:  I have reviewed the data as listed  Lab Results  Component Value Date   WBC 7.6 04/22/2023   NEUTROABS 4.3 04/22/2023   HGB 13.1 04/22/2023   HCT 41.7 04/22/2023   MCV 91 04/22/2023   PLT 579 (H) 04/22/2023      Component Value Date/Time   NA 139 04/22/2023 1439   K 4.4 04/22/2023 1439   CL 99 04/22/2023 1439   CO2 28 04/22/2023 1439   GLUCOSE 101 (H) 04/22/2023 1439   GLUCOSE 150 (H) 10/06/2022 0541   BUN 15 04/22/2023 1439   CREATININE 0.92 04/22/2023 1439   CREATININE 0.98 10/16/2012 1720   CALCIUM 10.0 04/22/2023 1439   PROT 7.0 04/22/2023 1439   ALBUMIN 4.2 04/22/2023 1439   AST 15 04/22/2023 1439   ALT 10 04/22/2023 1439   ALKPHOS 85 04/22/2023 1439   BILITOT 0.2 04/22/2023 1439   GFRNONAA >60 10/06/2022 0541   GFRNONAA 60 10/16/2012 1720   GFRAA 64 06/23/2020 1116   GFRAA 69 10/16/2012 1720       Chemistry      Component Value Date/Time   NA 139 04/22/2023 1439   K 4.4 04/22/2023 1439   CL 99 04/22/2023 1439   CO2 28 04/22/2023 1439   BUN 15 04/22/2023 1439   CREATININE  0.92 04/22/2023 1439   CREATININE 0.98 10/16/2012 1720      Component Value Date/Time   CALCIUM 10.0 04/22/2023 1439   ALKPHOS 85 04/22/2023 1439   AST 15 04/22/2023 1439   ALT 10 04/22/2023 1439   BILITOT 0.2 04/22/2023 1439       RADIOGRAPHIC STUDIES: CTA from 10/06/2022: Spleen is intact  ASSESSMENT & PLAN:  Patient is a 77 year old female with multiple comorbidities referred for thrombocytosis   Thrombocytosis Chronic elevated platelets since 2012, with recent increase to 579. No symptoms of fatigue, night sweats, or unexplained weight loss. No personal or family history of blood clots. Possible causes discussed include inflammation, iron deficiency, and smoking. No evidence of splenectomy. -Order complete blood count, iron panel, JAK2 mutation test, and inflammatory markers. -Consider further testing for leukemia if platelet count continues to rise. -Plan to review results in two  weeks.  Iron deficiency anemia History of slight iron deficiency, currently taking 65mg  of elemental iron daily. Reports occasional constipation. -Reduce iron supplementation to every other day. -Consider use of Miralax for constipation as needed. -Repeat iron labs today  Tobacco use 30-year history of smoking, currently 5-9 cigarettes daily. -Discussed potential contribution to thrombocytosis. -Patient is not ready for quitting yet   Orders Placed This Encounter  Procedures   JAK2 V617F rfx CALR/MPL/E12-15    Standing Status:   Future    Number of Occurrences:   1    Standing Expiration Date:   05/27/2024   Sedimentation rate    Standing Status:   Future    Number of Occurrences:   1    Standing Expiration Date:   05/27/2024   C-reactive protein    Standing Status:   Future    Number of Occurrences:   1    Standing Expiration Date:   05/27/2024   CBC with Differential/Platelet    Standing Status:   Future    Number of Occurrences:   1    Standing Expiration Date:   05/27/2024   Iron  and TIBC    Standing Status:   Future    Number of Occurrences:   1    Standing Expiration Date:   05/27/2024   Ferritin    Standing Status:   Future    Number of Occurrences:   1    Standing Expiration Date:   05/27/2024    The total time spent in the appointment was 30 minutes encounter with patients including review of chart and various tests results, discussions about plan of care and coordination of care plan   All questions were answered. The patient knows to call the clinic with any problems, questions or concerns. No barriers to learning was detected.   Cindie Crumbly, MD 10/23/20243:12 PM

## 2023-05-28 NOTE — Assessment & Plan Note (Signed)
History of slight iron deficiency, currently taking 65mg  of elemental iron daily. Reports occasional constipation. -Reduce iron supplementation to every other day. -Consider use of Miralax for constipation as needed. -Repeat iron labs today

## 2023-05-28 NOTE — Assessment & Plan Note (Signed)
30-year history of smoking, currently 5-9 cigarettes daily. -Discussed potential contribution to thrombocytosis. -Patient is not ready for quitting yet

## 2023-05-28 NOTE — Patient Instructions (Signed)
VISIT SUMMARY:  During today's visit, we discussed your ongoing health concerns, including your high platelet counts, iron deficiency, cholesterol levels, smoking habits, anxiety, and blood pressure management. We reviewed your history of gallbladder removal and its impact on your current health. We have outlined a plan to address each of these issues and will follow up on your test results in two weeks.  YOUR PLAN:  -THROMBOCYTOSIS: Thrombocytosis means having a higher than normal number of platelets in the blood. We will conduct a complete blood count, iron panel, JAK2 mutation test, and inflammatory markers to determine the cause. We will review the results in two weeks and consider further testing if necessary.  -IRON DEFICIENCY: Iron deficiency means having lower than normal levels of iron in the blood. You will reduce your iron supplementation to every other day to help with constipation. You may use Miralax as needed for constipation relief.  -HYPERLIPIDEMIA: Hyperlipidemia means having high levels of fats (lipids) in the blood. Continue taking Nexlizet as prescribed to manage your cholesterol levels.  -TOBACCO USE: Smoking can contribute to various health issues, including thrombocytosis. We discussed its potential impact, but no plan for smoking cessation was made at this time.  -ANXIETY: Anxiety is a feeling of worry or fear that can be managed with medication. Continue taking Lexapro and Clonazepam as prescribed.  INSTRUCTIONS:  Please complete the blood tests as ordered, including the complete blood count, iron panel, JAK2 mutation test, and inflammatory markers. We will review the results in two weeks. If you experience any new symptoms or have concerns before then, please contact our office.

## 2023-05-28 NOTE — Assessment & Plan Note (Signed)
Chronic elevated platelets since 2012, with recent increase to 579. No symptoms of fatigue, night sweats, or unexplained weight loss. No personal or family history of blood clots. Possible causes discussed include inflammation, iron deficiency, and smoking. No evidence of splenectomy. -Order complete blood count, iron panel, JAK2 mutation test, and inflammatory markers. -Consider further testing for leukemia if platelet count continues to rise. -Plan to review results in two weeks.

## 2023-06-05 LAB — JAK2 V617F RFX CALR/MPL/E12-15

## 2023-06-05 LAB — CALR +MPL + E12-E15  (REFLEX)

## 2023-06-12 ENCOUNTER — Ambulatory Visit: Payer: Medicare PPO | Admitting: Oncology

## 2023-06-12 ENCOUNTER — Inpatient Hospital Stay: Payer: Medicare PPO | Attending: Oncology | Admitting: Oncology

## 2023-06-12 ENCOUNTER — Encounter: Payer: Self-pay | Admitting: Oncology

## 2023-06-12 VITALS — BP 131/85 | HR 85 | Temp 98.2°F | Resp 16 | Wt 160.8 lb

## 2023-06-12 DIAGNOSIS — D509 Iron deficiency anemia, unspecified: Secondary | ICD-10-CM | POA: Diagnosis not present

## 2023-06-12 DIAGNOSIS — D75839 Thrombocytosis, unspecified: Secondary | ICD-10-CM

## 2023-06-12 NOTE — Patient Instructions (Signed)
VISIT SUMMARY:  During your follow-up visit, we reviewed your recent blood work and discussed your current health concerns. Your platelet count has improved and is now within the normal range. However, your iron levels remain low despite taking supplements.  YOUR PLAN:  -IRON DEFICIENCY: Iron deficiency means your body does not have enough iron, which is essential for making red blood cells. We discussed starting IV iron therapy to improve your iron levels and overall well-being. You should continue taking your oral iron supplements every other day (Monday, Wednesday, Friday).  -THROMBOCYTOSIS: Thrombocytosis means having a higher than normal platelet count. Your platelet count has improved to 420, which is within the normal range. We will continue to monitor this condition.   -COLONOSCOPY: A colonoscopy is a procedure to examine the inside of your colon. Your last colonoscopy was in 2012, so you are due for another one. We will refer you to a gastroenterologist for this procedure.  INSTRUCTIONS:  Please schedule your IV iron therapy for next week and the week after. Continue taking your oral iron supplements every other day (Monday, Wednesday, Friday). We will refer you to a gastroenterologist for a colonoscopy. Follow up in 3 months with repeat labs.

## 2023-06-12 NOTE — Assessment & Plan Note (Signed)
Platelet count improved to 420 (within normal range). JAK2 testing negative.  Likely secondary to iron deficiency anemia -Continue to monitor.

## 2023-06-12 NOTE — Progress Notes (Signed)
[facility] at [facility] HEMATOLOGY FOLLOW-UP VISIT  Peggy Smith, [name], FNP  REASON FOR FOLLOW-UP: Thrombocytosis  ASSESSMENT & PLAN:  Patient is a 77 year old female with iron deficiency anemia referred for thrombocytosis.  Thrombocytosis Platelet count improved to 420 (within normal range). JAK2 testing negative.  Likely secondary to iron deficiency anemia -Continue to monitor.  IDA (iron deficiency anemia) Mild iron deficiency noted on labs. Patient currently taking oral iron supplementation every other day. Discussed the benefits of IV iron therapy to improve iron levels and overall well-being. -Schedule IV iron therapy. The most likely cause of her anemia is due to chronic blood loss. We discussed some of the risks, benefits, and alternatives of intravenous iron infusions. She tolerated oral iron supplement poorly and desires to achieved higher levels of iron faster for adequate hematopoesis. Some of the side-effects to be expected including risks of infusion reactions, phlebitis, headaches, nausea and fatigue.  The patient is willing to proceed. Patient education material was dispensed. Goal is to keep ferritin level greater than 50 and resolution of anemia  -Continue oral iron supplementation every other day (Monday, Wednesday, Friday). -Last colonoscopy in 2012. Patient is due for another colonoscopy. Refer to GI for colonoscopy. -Repeat CBC, iron panel, ferritin in 3 months.  Return to clinic in 3 months with labs   Orders Placed This Encounter  Procedures   CBC with Differential    Standing Status:   Future    Standing Expiration Date:   06/11/2024   Iron and TIBC ([facility] ONLY)    Standing Status:   Future    Standing Expiration Date:   06/11/2024   Ferritin    Standing Status:   Future    Standing Expiration Date:   06/11/2024    The total time spent in the appointment was 20 minutes encounter with patients including  review of chart and various tests results, discussions about plan of care and coordination of care plan   All questions were answered. The patient knows to call the clinic with any problems, questions or concerns. No barriers to learning was detected.  Cindie Crumbly, MD 11/7/202410:30 AM    INTERVAL HISTORY: Peggy Smith 77 y.o. female is here for follow-up for her thrombocytosis and iron deficiency anemia.  She has no complaints today and since we saw her last time 2 weeks ago, nothing has changed.  Repeat labs showed that platelets of 420 which are within normal limits.  Patient also stated that she has been tolerating every other day iron better.  I have reviewed the past medical history, past surgical history, social history and family history with the patient   ALLERGIES:  is allergic to crestor [rosuvastatin], macrodantin, morphine, sulfa antibiotics, and codeine.  MEDICATIONS:  Current Outpatient Medications  Medication Sig Dispense Refill   amLODipine (NORVASC) 5 MG tablet TAKE 1 TABLET BY MOUTH EVERY DAY 90 tablet 1   aspirin 81 MG tablet Take 81 mg by mouth every morning.     atorvastatin (LIPITOR) 40 MG tablet Take 1 tablet (40 mg total) by mouth 2 (two) times a week. Take Mon, Thur 90 tablet 0   Bempedoic Acid-Ezetimibe (NEXLIZET) 180-10 MG TABS Take 1 tablet by mouth daily. 90 tablet 3   calcium carbonate (OS-CAL) 600 MG TABS Take 600 mg by mouth every evening.     Cholecalciferol (VITAMIN D-3) 5000 UNITS TABS Take 5,000 Units by mouth every evening.     clonazePAM (KLONOPIN) 0.5 MG tablet Take 1 tablet (  0.5 mg total) by mouth 2 (two) times daily as needed for anxiety. 60 tablet 5   escitalopram (LEXAPRO) 20 MG tablet TAKE 1 TABLET BY MOUTH EVERYDAY AT BEDTIME 90 tablet 1   Ferrous Sulfate (IRON) 325 (65 Fe) MG TABS Take by mouth. Takes Monday, Wednesday, and Friday     fluticasone (FLONASE) 50 MCG/ACT nasal spray SPRAY 2 SPRAYS INTO EACH NOSTRIL EVERY DAY 48 mL 1    ibuprofen (ADVIL) 200 MG tablet Take 400 mg by mouth every 6 (six) hours as needed for moderate pain.     levothyroxine (SYNTHROID) 88 MCG tablet Take 1 tablet (88 mcg total) by mouth daily. 90 tablet 1   Magnesium 250 MG TABS Take 250 mg by mouth daily.     Multiple Vitamins-Minerals (CENTRUM SILVER PO) Take 1 tablet by mouth every morning.     omeprazole (PRILOSEC) 40 MG capsule Take 1 capsule (40 mg total) by mouth daily. 90 capsule 1   Riboflavin (B-2) 100 MG TABS Take 100 mg by mouth daily.     White Petrolatum (VASELINE EX) Apply 1 Application topically daily as needed (cracked skin).     No current facility-administered medications for this visit.     REVIEW OF SYSTEMS:   Constitutional: Denies fevers, chills or night sweats Eyes: Denies blurriness of vision Ears, nose, mouth, throat, and face: Denies mucositis or sore throat Respiratory: Denies cough, dyspnea or wheezes Cardiovascular: Denies palpitation, chest discomfort or lower extremity swelling Gastrointestinal:  Denies nausea, heartburn or change in bowel habits Skin: Denies abnormal skin rashes Lymphatics: Denies new lymphadenopathy or easy bruising Neurological:Denies numbness, tingling or new weaknesses Behavioral/Psych: Mood is stable, no new changes  All other systems were reviewed with the patient and are negative.  PHYSICAL EXAMINATION:   Vitals:   06/12/23 0941  BP: 131/85  Pulse: 85  Resp: 16  Temp: 98.2 F (36.8 C)  SpO2: 94%    GENERAL:alert, no distress and comfortable SKIN: skin color, texture, turgor are normal, no rashes or significant lesions LUNGS: clear to auscultation and percussion with normal breathing effort HEART: regular rate & rhythm and no murmurs and no lower extremity edema ABDOMEN:abdomen soft, non-tender and normal bowel sounds Musculoskeletal:no cyanosis of digits and no clubbing  NEURO: alert & oriented x 3 with fluent speech  LABORATORY DATA:  I have reviewed the data as  listed  Lab Results  Component Value Date   WBC 7.4 05/28/2023   NEUTROABS 4.0 05/28/2023   HGB 13.5 05/28/2023   HCT 40.5 05/28/2023   MCV 88.8 05/28/2023   PLT 420 (H) 05/28/2023      Component Value Date/Time   NA 139 04/22/2023 1439   K 4.4 04/22/2023 1439   CL 99 04/22/2023 1439   CO2 28 04/22/2023 1439   GLUCOSE 101 (H) 04/22/2023 1439   GLUCOSE 150 (H) 10/06/2022 0541   BUN 15 04/22/2023 1439   CREATININE 0.92 04/22/2023 1439   CREATININE 0.98 10/16/2012 1720   CALCIUM 10.0 04/22/2023 1439   PROT 7.0 04/22/2023 1439   ALBUMIN 4.2 04/22/2023 1439   AST 15 04/22/2023 1439   ALT 10 04/22/2023 1439   ALKPHOS 85 04/22/2023 1439   BILITOT 0.2 04/22/2023 1439   GFRNONAA >60 10/06/2022 0541   GFRNONAA 60 10/16/2012 1720   GFRAA 64 06/23/2020 1116   GFRAA 69 10/16/2012 1720       Chemistry      Component Value Date/Time   NA 139 04/22/2023 1439   K 4.4  04/22/2023 1439   CL 99 04/22/2023 1439   CO2 28 04/22/2023 1439   BUN 15 04/22/2023 1439   CREATININE 0.92 04/22/2023 1439   CREATININE 0.98 10/16/2012 1720      Component Value Date/Time   CALCIUM 10.0 04/22/2023 1439   ALKPHOS 85 04/22/2023 1439   AST 15 04/22/2023 1439   ALT 10 04/22/2023 1439   BILITOT 0.2 04/22/2023 1439     Lab Results  Component Value Date   IRON 82 05/28/2023   TIBC 436 05/28/2023   FERRITIN 15 05/28/2023      Latest Reference Range & Units 05/28/23 14:56  Saturation Ratios 10.4 - 31.8 % 19

## 2023-06-12 NOTE — Assessment & Plan Note (Addendum)
Mild iron deficiency noted on labs. Patient currently taking oral iron supplementation every other day. Discussed the benefits of IV iron therapy to improve iron levels and overall well-being. -Schedule IV iron therapy. The most likely cause of her anemia is due to chronic blood loss. We discussed some of the risks, benefits, and alternatives of intravenous iron infusions. She tolerated oral iron supplement poorly and desires to achieved higher levels of iron faster for adequate hematopoesis. Some of the side-effects to be expected including risks of infusion reactions, phlebitis, headaches, nausea and fatigue.  The patient is willing to proceed. Patient education material was dispensed. Goal is to keep ferritin level greater than 50 and resolution of anemia  -Continue oral iron supplementation every other day (Monday, Wednesday, Friday). -Last colonoscopy in 2012. Patient is due for another colonoscopy. Refer to GI for colonoscopy. -Repeat CBC, iron panel, ferritin in 3 months.  Return to clinic in 3 months with labs

## 2023-06-18 ENCOUNTER — Inpatient Hospital Stay: Payer: Medicare PPO

## 2023-06-18 VITALS — BP 160/78 | HR 66 | Temp 97.1°F | Resp 18

## 2023-06-18 DIAGNOSIS — D509 Iron deficiency anemia, unspecified: Secondary | ICD-10-CM | POA: Diagnosis not present

## 2023-06-18 MED ORDER — ACETAMINOPHEN 325 MG PO TABS
650.0000 mg | ORAL_TABLET | Freq: Once | ORAL | Status: AC
  Administered 2023-06-18: 650 mg via ORAL
  Filled 2023-06-18: qty 2

## 2023-06-18 MED ORDER — FAMOTIDINE IN NACL 20-0.9 MG/50ML-% IV SOLN
20.0000 mg | Freq: Once | INTRAVENOUS | Status: AC
  Administered 2023-06-18: 20 mg via INTRAVENOUS
  Filled 2023-06-18: qty 50

## 2023-06-18 MED ORDER — SODIUM CHLORIDE 0.9 % IV SOLN
400.0000 mg | Freq: Once | INTRAVENOUS | Status: AC
  Administered 2023-06-18: 400 mg via INTRAVENOUS
  Filled 2023-06-18: qty 400

## 2023-06-18 MED ORDER — SODIUM CHLORIDE 0.9 % IV SOLN: INTRAVENOUS | Status: DC

## 2023-06-18 MED ORDER — CETIRIZINE HCL 10 MG PO TABS
10.0000 mg | ORAL_TABLET | Freq: Once | ORAL | Status: AC
Start: 2023-06-18 — End: 2023-06-18
  Administered 2023-06-18: 10 mg via ORAL
  Filled 2023-06-18: qty 1

## 2023-06-18 MED ORDER — SODIUM CHLORIDE 0.9% FLUSH: 10.0000 mL | Freq: Two times a day (BID) | INTRAVENOUS | Status: DC

## 2023-06-18 NOTE — Progress Notes (Signed)
Patient presents today for Venofer infusion per providers order.  Vital signs WNL.  Patient has no new complaints at this time.  Peripheral IV started and blood return noted pre and post infusion.  Stable during infusion without adverse affects.  Vital signs stable.  No complaints at this time.  Discharge from clinic ambulatory in stable condition.  Alert and oriented X 3.  Follow up with [facility] as scheduled.  

## 2023-06-18 NOTE — Patient Instructions (Signed)
[facility] - A DEPT OF [facility]  Discharge Instructions: Thank you for choosing [facility] to provide your oncology and hematology care.  If you have a lab appointment with the Cancer Center - please note that after April 8th, 2024, all labs will be drawn in the cancer center.  You do not have to check in or register with the main entrance as you have in the past but will complete your check-in in the cancer center.  Wear comfortable clothing and clothing appropriate for easy access to any Portacath or PICC line.   We strive to give you quality time with your provider. You may need to reschedule your appointment if you arrive late (15 or more minutes).  Arriving late affects you and other patients whose appointments are after yours.  Also, if you miss three or more appointments without notifying the office, you may be dismissed from the clinic at the provider's discretion.      For prescription refill requests, have your pharmacy contact our office and allow 72 hours for refills to be completed.    Today you received the following chemotherapy and/or immunotherapy agents Venofer      To help prevent nausea and vomiting after your treatment, we encourage you to take your nausea medication as directed.  BELOW ARE SYMPTOMS THAT SHOULD BE REPORTED IMMEDIATELY: *FEVER GREATER THAN 100.4 F (38 C) OR HIGHER *CHILLS OR SWEATING *NAUSEA AND VOMITING THAT IS NOT CONTROLLED WITH YOUR NAUSEA MEDICATION *UNUSUAL SHORTNESS OF BREATH *UNUSUAL BRUISING OR BLEEDING *URINARY PROBLEMS (pain or burning when urinating, or frequent urination) *BOWEL PROBLEMS (unusual diarrhea, constipation, pain near the anus) TENDERNESS IN MOUTH AND THROAT WITH OR WITHOUT PRESENCE OF ULCERS (sore throat, sores in mouth, or a toothache) UNUSUAL RASH, SWELLING OR PAIN  UNUSUAL VAGINAL DISCHARGE OR ITCHING   Items with * indicate a potential emergency and should be followed up  as soon as possible or go to the Emergency Department if any problems should occur.  Please show the CHEMOTHERAPY ALERT CARD or IMMUNOTHERAPY ALERT CARD at check-in to the Emergency Department and triage nurse.  Should you have questions after your visit or need to cancel or reschedule your appointment, please contact [facility] - A DEPT OF [facility] [phone]  and follow the prompts.  Office hours are 8:00 a.m. to 4:30 p.m. Monday - Friday. Please note that voicemails left after 4:00 p.m. may not be returned until the following business day.  We are closed weekends and major holidays. You have access to a nurse at all times for urgent questions. Please call the main number to the clinic [phone] and follow the prompts.  For any non-urgent questions, you may also contact your provider using MyChart. We now offer e-Visits for anyone 58 and older to request care online for non-urgent symptoms. For details visit [email or web address].   Also download the MyChart app! Go to the app store, search "MyChart", open the app, select [place], and log in with your MyChart username and password.

## 2023-06-30 ENCOUNTER — Inpatient Hospital Stay: Payer: Medicare PPO

## 2023-06-30 VITALS — BP 144/70 | HR 69 | Temp 98.3°F | Resp 18

## 2023-06-30 DIAGNOSIS — D509 Iron deficiency anemia, unspecified: Secondary | ICD-10-CM | POA: Diagnosis not present

## 2023-06-30 MED ORDER — IRON SUCROSE 20 MG/ML IV SOLN
400.0000 mg | Freq: Once | INTRAVENOUS | Status: AC
  Administered 2023-06-30: 400 mg via INTRAVENOUS
  Filled 2023-06-30: qty 20

## 2023-06-30 MED ORDER — SODIUM CHLORIDE 0.9 % IV SOLN: Freq: Once | INTRAVENOUS | Status: AC

## 2023-06-30 MED ORDER — ACETAMINOPHEN 325 MG PO TABS
650.0000 mg | ORAL_TABLET | Freq: Once | ORAL | Status: AC
  Administered 2023-06-30: 650 mg via ORAL
  Filled 2023-06-30: qty 2

## 2023-06-30 MED ORDER — FAMOTIDINE IN NACL 20-0.9 MG/50ML-% IV SOLN
20.0000 mg | Freq: Once | INTRAVENOUS | Status: AC
  Administered 2023-06-30: 20 mg via INTRAVENOUS
  Filled 2023-06-30: qty 50

## 2023-06-30 MED ORDER — SODIUM CHLORIDE 0.9% FLUSH
10.0000 mL | Freq: Two times a day (BID) | INTRAVENOUS | Status: DC
Start: 2023-06-30 — End: 2023-06-30

## 2023-06-30 MED ORDER — CETIRIZINE HCL 10 MG PO TABS
10.0000 mg | ORAL_TABLET | Freq: Once | ORAL | Status: AC
  Administered 2023-06-30: 10 mg via ORAL
  Filled 2023-06-30: qty 1

## 2023-06-30 NOTE — Progress Notes (Signed)
Patient tolerated iron infusion with no complaints voiced.  Peripheral IV site clean and dry with good blood return noted before and after infusion.  Band aid applied.  Pt observed for 30 minutes post venofer infusion with no complications. VSS with discharge and left in satisfactory condition with no s/s of distress noted. All follow ups as scheduled.  Keisuke Hollabaugh [employer]

## 2023-06-30 NOTE — Patient Instructions (Signed)
Iron Sucrose Injection What is this medication? IRON SUCROSE (EYE ern SOO krose) treats low levels of iron (iron deficiency anemia) in people with kidney disease. Iron is a mineral that plays an important role in making red blood cells, which carry oxygen from your lungs to the rest of your body. This medicine may be used for other purposes; ask your health care provider or pharmacist if you have questions. COMMON BRAND NAME(S): Venofer What should I tell my care team before I take this medication? They need to know if you have any of these conditions: Anemia not caused by low iron levels Heart disease High levels of iron in the blood Kidney disease Liver disease An unusual or allergic reaction to iron, other medications, foods, dyes, or preservatives Pregnant or trying to get pregnant Breastfeeding How should I use this medication? This medication is for infusion into a vein. It is given in a hospital or clinic setting. Talk to your care team about the use of this medication in children. While this medication may be prescribed for children as young as 2 years for selected conditions, precautions do apply. Overdosage: If you think you have taken too much of this medicine contact a poison control center or emergency room at once. NOTE: This medicine is only for you. Do not share this medicine with others. What if I miss a dose? Keep appointments for follow-up doses. It is important not to miss your dose. Call your care team if you are unable to keep an appointment. What may interact with this medication? Do not take this medication with any of the following: Deferoxamine Dimercaprol Other iron products This medication may also interact with the following: Chloramphenicol Deferasirox This list may not describe all possible interactions. Give your health care provider a list of all the medicines, herbs, non-prescription drugs, or dietary supplements you use. Also tell them if you smoke,  drink alcohol, or use illegal drugs. Some items may interact with your medicine. What should I watch for while using this medication? Visit your care team regularly. Tell your care team if your symptoms do not start to get better or if they get worse. You may need blood work done while you are taking this medication. You may need to follow a special diet. Talk to your care team. Foods that contain iron include: whole grains/cereals, dried fruits, beans, or peas, leafy green vegetables, and organ meats (liver, kidney). What side effects may I notice from receiving this medication? Side effects that you should report to your care team as soon as possible: Allergic reactions--skin rash, itching, hives, swelling of the face, lips, tongue, or throat Low blood pressure--dizziness, feeling faint or lightheaded, blurry vision Shortness of breath Side effects that usually do not require medical attention (report to your care team if they continue or are bothersome): Flushing Headache Joint pain Muscle pain Nausea Pain, redness, or irritation at injection site This list may not describe all possible side effects. Call your doctor for medical advice about side effects. You may report side effects to FDA at 1-800-FDA-1088. Where should I keep my medication? This medication is given in a hospital or clinic. It will not be stored at home. NOTE: This sheet is a summary. It may not cover all possible information. If you have questions about this medicine, talk to your doctor, pharmacist, or health care provider.  2024 Elsevier/Gold Standard (2022-12-27 00:00:00)

## 2023-07-07 ENCOUNTER — Encounter: Payer: Self-pay | Admitting: Gastroenterology

## 2023-07-07 ENCOUNTER — Ambulatory Visit: Payer: Medicare PPO | Admitting: Gastroenterology

## 2023-07-07 VITALS — BP 138/77 | HR 86 | Temp 97.9°F | Ht 68.0 in | Wt 162.4 lb

## 2023-07-07 DIAGNOSIS — D509 Iron deficiency anemia, unspecified: Secondary | ICD-10-CM

## 2023-07-07 DIAGNOSIS — K219 Gastro-esophageal reflux disease without esophagitis: Secondary | ICD-10-CM

## 2023-07-07 NOTE — Patient Instructions (Signed)
Colonoscopy and upper endoscopy with Dr. Marletta Lor to evaluate your iron deficiency anemia.

## 2023-07-07 NOTE — Progress Notes (Signed)
GI Office Note    Referring Provider: Dr. Anders Simmonds Primary Care Physician:  Bennie Pierini, FNP  Primary Gastroenterologist: Hennie Duos. Marletta Lor, DO   Chief Complaint   Chief Complaint  Patient presents with   Colonoscopy     History of Present Illness   Peggy Smith is a 77 y.o. female presenting today at the request of Dr. Anders Simmonds for colonoscopy due to mild IDA. Patient has been on oral iron supplements, recently received iron infusions.   Patient states she was found to be anemic when she was being worked up for gallbladder disease. Started oral iron at that time (around March or April of this year). Recently received two iron infusions. Taking oral iron every other day now. BMs daily. Stools dark on iron. Heartburn controlled with omeprazole if she remembers to take it. No dysphagia, no vomiting. No abdominal pain since her gallbladder was removed. She has a lot going on at church this month and wants to postpone GI work up until 08/2023. Patient had positive Cologuard in 02/2022. Patient did not follow through with colonoscopy at that time because her PCP's office would not refer her locally since she was established at [facility].   Colonoscopy 03/2011: -10mm pedunculated polyp in distal transverse colon, benign hamartomatous polyp with no adenomatous changes -mild diverticulosis in sigmoid colon  EGD 03/2011: -mild diffuse gastritis, no h. pylori -small bowel biopsy negative  Medications   Current Outpatient Medications  Medication Sig Dispense Refill   acetaminophen (TYLENOL) 500 MG tablet Take 500 mg by mouth at bedtime.     amLODipine (NORVASC) 5 MG tablet TAKE 1 TABLET BY MOUTH EVERY DAY 90 tablet 1   aspirin 81 MG tablet Take 81 mg by mouth every morning.     atorvastatin (LIPITOR) 40 MG tablet Take 1 tablet (40 mg total) by mouth 2 (two) times a week. Take Mon, Thur 90 tablet 0   Bempedoic Acid-Ezetimibe (NEXLIZET) 180-10 MG TABS Take 1 tablet by mouth  daily. 90 tablet 3   calcium carbonate (OS-CAL) 600 MG TABS Take 600 mg by mouth every evening.     Cholecalciferol (VITAMIN D-3) 5000 UNITS TABS Take 5,000 Units by mouth every evening.     clonazePAM (KLONOPIN) 0.5 MG tablet Take 1 tablet (0.5 mg total) by mouth 2 (two) times daily as needed for anxiety. 60 tablet 5   escitalopram (LEXAPRO) 20 MG tablet TAKE 1 TABLET BY MOUTH EVERYDAY AT BEDTIME 90 tablet 1   Ferrous Sulfate (IRON) 325 (65 Fe) MG TABS Take by mouth. Takes Monday, Wednesday, and Friday     fluticasone (FLONASE) 50 MCG/ACT nasal spray SPRAY 2 SPRAYS INTO EACH NOSTRIL EVERY DAY 48 mL 1   levothyroxine (SYNTHROID) 88 MCG tablet Take 1 tablet (88 mcg total) by mouth daily. 90 tablet 1   Magnesium 250 MG TABS Take 250 mg by mouth daily.     Multiple Vitamins-Minerals (CENTRUM SILVER PO) Take 1 tablet by mouth every morning.     omeprazole (PRILOSEC) 40 MG capsule Take 1 capsule (40 mg total) by mouth daily. 90 capsule 1   Riboflavin (B-2) 100 MG TABS Take 100 mg by mouth daily.     vitamin C (ASCORBIC ACID) 250 MG tablet Take 250 mg by mouth daily.     White Petrolatum (VASELINE EX) Apply 1 Application topically daily as needed (cracked skin).     No current facility-administered medications for this visit.    Allergies   Allergies as of  07/07/2023 - Review Complete 07/07/2023  Allergen Reaction Noted   Crestor [rosuvastatin]  01/19/2013   Macrodantin Other (See Comments) 01/14/2011   Morphine Nausea And Vomiting 09/14/2022   Sulfa antibiotics Other (See Comments) 01/14/2011   Codeine Nausea And Vomiting 05/17/2011    Past Medical History   Past Medical History:  Diagnosis Date   Angina    STRESS TEST NORMAL-ANXIETY   Anxiety    Cardiac arrhythmia due to congenital heart disease    Depression    GERD (gastroesophageal reflux disease)    Headache(784.0)    OTC MEDS   Hepatitis     1976   Hyperlipidemia    DOES NOT TAKE MEDS - HAS THEM AT HOME BUT STATES DOES  NOT TAKE   Hypertension    Hypothyroidism    Iron deficiency anemia    Melanoma (HCC)    Panic attacks    PONV (postoperative nausea and vomiting)     Past Surgical History   Past Surgical History:  Procedure Laterality Date   ABDOMINAL HYSTERECTOMY     COLONOSCOPY     CYSTOSCOPY  11/08/2011   Procedure: CYSTOSCOPY;  Surgeon: Purcell Nails, MD;  Location: [facility];  Service: Gynecology;  Laterality: N/A;   DILATION AND CURETTAGE OF UTERUS     LAPAROSCOPIC HYSTERECTOMY  11/08/2011   Procedure: HYSTERECTOMY TOTAL LAPAROSCOPIC;  Surgeon: Purcell Nails, MD;  Location: [facility];  Service: Gynecology;  Laterality: N/A;  with Bilateral Salpingo-Oophorectomies   MELANOMA EXCISION     Left Shoulder   SVD      X 4   TUBAL LIGATION     UPPER GASTROINTESTINAL ENDOSCOPY      Past Family History   Family History  Problem Relation Age of Onset   Dementia Mother    Brain cancer Father    Anxiety disorder Brother    Asthma Son    Colon cancer Neg Hx    Breast cancer Neg Hx     Past Social History   Social History   Socioeconomic History   Marital status: Widowed    Spouse name: Billey Gosling   Number of children: 4   Years of education: Not on file   Highest education level: Master's degree (e.g., MA, MS, MEng, MEd, MSW, MBA)  Occupational History   Occupation: paralegal    Employer: [employer]    Comment: quit 2021 - thinking of starting back part time  Tobacco Use   Smoking status: Every Day    Current packs/day: 0.25    Average packs/day: 0.3 packs/day for 30.0 years (7.5 ttl pk-yrs)    Types: Cigarettes   Smokeless tobacco: Never  Vaping Use   Vaping status: Never Used  Substance and Sexual Activity   Alcohol use: No   Drug use: No   Sexual activity: Yes    Birth control/protection: Surgical  Other Topics Concern   Not on file  Social History Narrative   Lives alone, but one son stays with her a lot - Daughter lives in [place], other children live nearby    Social Determinants of Health   Financial Resource Strain: Low Risk  (01/13/2023)   Overall Financial Resource Strain (CARDIA)    Difficulty of Paying Living Expenses: Not hard at all  Food Insecurity: No Food Insecurity (01/13/2023)   Hunger Vital Sign    Worried About Running Out of Food in the Last Year: Never true    Ran Out of Food in the Last Year: Never true  Transportation Needs: No Transportation Needs (01/13/2023)   PRAPARE - Administrator, Civil Service (Medical): No    Lack of Transportation (Non-Medical): No  Physical Activity: Insufficiently Active (01/11/2022)   Exercise Vital Sign    Days of Exercise per Week: 7 days    Minutes of Exercise per Session: 10 min  Stress: No Stress Concern Present (01/13/2023)   [employer] of Occupational Health - Occupational Stress Questionnaire    Feeling of Stress : Not at all  Social Connections: Moderately Isolated (01/13/2023)   Social Connection and Isolation Panel [NHANES]    Frequency of Communication with Friends and Family: More than three times a week    Frequency of Social Gatherings with Friends and Family: More than three times a week    Attends Religious Services: More than 4 times per year    Active Member of [employer] or Organizations: No    Attends Banker Meetings: Never    Marital Status: Widowed  Intimate Partner Violence: Not At Risk (01/13/2023)   Humiliation, Afraid, Rape, and Kick questionnaire    Fear of Current or Ex-Partner: No    Emotionally Abused: No    Physically Abused: No    Sexually Abused: No    Review of Systems   General: Negative for anorexia, weight loss, fever, chills, fatigue, weakness. Eyes: Negative for vision changes.  ENT: Negative for hoarseness, difficulty swallowing , nasal congestion. CV: Negative for chest pain, angina, palpitations, dyspnea on exertion, peripheral edema.  Respiratory: Negative for dyspnea at rest, dyspnea on exertion, +cough, sputum,  wheezing.  GI: See history of present illness. GU:  Negative for dysuria, hematuria, urinary incontinence, urinary frequency, nocturnal urination.  MS: Negative for joint pain, low back pain.  Derm: Negative for rash or itching.  Neuro: Negative for weakness, abnormal sensation, seizure, frequent headaches, memory loss,  confusion.  Psych: Negative for  suicidal ideation, hallucinations. +anx/dep Endo: Negative for unusual weight change.  Heme: Negative for bruising or bleeding. Allergy: Negative for rash or hives.  Physical Exam   BP 138/77 (BP Location: Right Arm, Patient Position: Sitting, Cuff Size: Normal)   Pulse 86   Temp 97.9 F (36.6 C) (Oral)   Ht 5\' 8"  (1.727 m)   Wt 162 lb 6.4 oz (73.7 kg)   SpO2 93%   BMI 24.69 kg/m    General: Well-nourished, well-developed in no acute distress.  Head: Normocephalic, atraumatic.   Eyes: Conjunctiva pink, no icterus. Mouth: Oropharyngeal mucosa moist and pink  Neck: Supple without thyromegaly, masses, or lymphadenopathy.  Lungs: Clear to auscultation bilaterally.  Heart: Regular rate and rhythm, no murmurs rubs or gallops.  Abdomen: Bowel sounds are normal, nontender, nondistended, no hepatosplenomegaly or masses,  no abdominal bruits or hernia, no rebound or guarding.   Rectal: not performed Extremities: No lower extremity edema. No clubbing or deformities.  Neuro: Alert and oriented x 4 , grossly normal neurologically.  Skin: Warm and dry, no rash or jaundice.   Psych: Alert and cooperative, normal mood and affect.  Labs   Lab Results  Component Value Date   IRON 82 05/28/2023   TIBC 436 05/28/2023   FERRITIN 15 05/28/2023   Lab Results  Component Value Date   WBC 7.4 05/28/2023   HGB 13.5 05/28/2023   HCT 40.5 05/28/2023   MCV 88.8 05/28/2023   PLT 420 (H) 05/28/2023   Lab Results  Component Value Date   NA 139 04/22/2023   CL 99 04/22/2023  K 4.4 04/22/2023   CO2 28 04/22/2023   BUN 15 04/22/2023    CREATININE 0.92 04/22/2023   EGFR 64 04/22/2023   CALCIUM 10.0 04/22/2023   ALBUMIN 4.2 04/22/2023   GLUCOSE 101 (H) 04/22/2023   Lab Results  Component Value Date   ALT 10 04/22/2023   AST 15 04/22/2023   ALKPHOS 85 04/22/2023   BILITOT 0.2 04/22/2023   Lab Results  Component Value Date   TSH 1.770 04/22/2023   No results found for: "HGBA1C"  Imaging Studies   No results found.  Assessment/Plan:    IDA -noted 8-9 months ago, initially on oral iron. Recently received iron infusions. Hemoccult status unknown but had positive Cologuard 02/2022 -last EGD/colonoscopy in 2012 -recommend colonoscopy along with EGD to evaluate IDA. ASA 2.  I have discussed the risks, alternatives, benefits with regards to but not limited to the risk of reaction to medication, bleeding, infection, perforation and the patient is agreeable to proceed. Written consent to be obtained.  GERD -controlled on PPI, no alarm features -remote EGD in 2012  Mairany Bruno S. Melvyn Neth, MHS, PA-C [facility]

## 2023-07-16 ENCOUNTER — Telehealth: Payer: Self-pay | Admitting: *Deleted

## 2023-07-16 NOTE — Telephone Encounter (Signed)
LMOVM to call back to schedule TCS/EGD with Dr. Marletta Lor, ASA 2 in January, clenpiq or other small volume prep only. Hold iron x 7 days

## 2023-07-23 ENCOUNTER — Encounter: Payer: Self-pay | Admitting: Internal Medicine

## 2023-07-25 MED ORDER — CLENPIQ 10-3.5-12 MG-GM -GM/175ML PO SOLN: 1.0000 | ORAL | 0 refills | Status: DC

## 2023-07-25 NOTE — Telephone Encounter (Signed)
PA approved via cohere. Authorization #[id number]  DOS:  08/19/2023 - 10/17/2023

## 2023-07-25 NOTE — Addendum Note (Signed)
Addended by: Armstead Peaks on: 07/25/2023 07:32 AM   Modules accepted: Orders

## 2023-08-19 ENCOUNTER — Ambulatory Visit (HOSPITAL_COMMUNITY)
Admission: RE | Admit: 2023-08-19 | Discharge: 2023-08-19 | Disposition: A | Discharge status: Home / Self Care | Payer: Medicare PPO | Source: Ambulatory Visit | Attending: Internal Medicine | Admitting: Internal Medicine

## 2023-08-19 ENCOUNTER — Encounter (HOSPITAL_COMMUNITY): Payer: Self-pay | Admitting: Internal Medicine

## 2023-08-19 ENCOUNTER — Ambulatory Visit (HOSPITAL_COMMUNITY): Payer: Medicare PPO | Admitting: Anesthesiology

## 2023-08-19 ENCOUNTER — Encounter (HOSPITAL_COMMUNITY)
Admission: RE | Disposition: A | Discharge status: Home / Self Care | Payer: Self-pay | Source: Ambulatory Visit | Attending: Internal Medicine

## 2023-08-19 ENCOUNTER — Other Ambulatory Visit: Payer: Self-pay

## 2023-08-19 ENCOUNTER — Ambulatory Visit (HOSPITAL_BASED_OUTPATIENT_CLINIC_OR_DEPARTMENT_OTHER): Payer: Medicare PPO | Admitting: Anesthesiology

## 2023-08-19 DIAGNOSIS — K635 Polyp of colon: Secondary | ICD-10-CM | POA: Diagnosis not present

## 2023-08-19 DIAGNOSIS — K648 Other hemorrhoids: Secondary | ICD-10-CM | POA: Insufficient documentation

## 2023-08-19 DIAGNOSIS — K2970 Gastritis, unspecified, without bleeding: Secondary | ICD-10-CM | POA: Diagnosis not present

## 2023-08-19 DIAGNOSIS — D509 Iron deficiency anemia, unspecified: Secondary | ICD-10-CM | POA: Insufficient documentation

## 2023-08-19 DIAGNOSIS — D759 Disease of blood and blood-forming organs, unspecified: Secondary | ICD-10-CM | POA: Insufficient documentation

## 2023-08-19 DIAGNOSIS — F419 Anxiety disorder, unspecified: Secondary | ICD-10-CM | POA: Insufficient documentation

## 2023-08-19 DIAGNOSIS — K5730 Diverticulosis of large intestine without perforation or abscess without bleeding: Secondary | ICD-10-CM | POA: Insufficient documentation

## 2023-08-19 DIAGNOSIS — Z1211 Encounter for screening for malignant neoplasm of colon: Secondary | ICD-10-CM | POA: Diagnosis not present

## 2023-08-19 DIAGNOSIS — D123 Benign neoplasm of transverse colon: Secondary | ICD-10-CM | POA: Insufficient documentation

## 2023-08-19 DIAGNOSIS — I25119 Atherosclerotic heart disease of native coronary artery with unspecified angina pectoris: Secondary | ICD-10-CM | POA: Insufficient documentation

## 2023-08-19 DIAGNOSIS — K529 Noninfective gastroenteritis and colitis, unspecified: Secondary | ICD-10-CM | POA: Diagnosis not present

## 2023-08-19 DIAGNOSIS — K219 Gastro-esophageal reflux disease without esophagitis: Secondary | ICD-10-CM | POA: Diagnosis not present

## 2023-08-19 DIAGNOSIS — D125 Benign neoplasm of sigmoid colon: Secondary | ICD-10-CM | POA: Diagnosis not present

## 2023-08-19 DIAGNOSIS — I10 Essential (primary) hypertension: Secondary | ICD-10-CM | POA: Diagnosis not present

## 2023-08-19 DIAGNOSIS — F17210 Nicotine dependence, cigarettes, uncomplicated: Secondary | ICD-10-CM | POA: Diagnosis not present

## 2023-08-19 DIAGNOSIS — K2950 Unspecified chronic gastritis without bleeding: Secondary | ICD-10-CM | POA: Diagnosis not present

## 2023-08-19 DIAGNOSIS — E039 Hypothyroidism, unspecified: Secondary | ICD-10-CM | POA: Insufficient documentation

## 2023-08-19 DIAGNOSIS — R195 Other fecal abnormalities: Secondary | ICD-10-CM | POA: Insufficient documentation

## 2023-08-19 DIAGNOSIS — F32A Depression, unspecified: Secondary | ICD-10-CM | POA: Diagnosis not present

## 2023-08-19 HISTORY — PX: COLONOSCOPY WITH PROPOFOL: SHX5780

## 2023-08-19 HISTORY — PX: BIOPSY: SHX5522

## 2023-08-19 HISTORY — PX: POLYPECTOMY: SHX5525

## 2023-08-19 HISTORY — PX: ESOPHAGOGASTRODUODENOSCOPY (EGD) WITH PROPOFOL: SHX5813

## 2023-08-19 SURGERY — COLONOSCOPY WITH PROPOFOL
Anesthesia: General

## 2023-08-19 MED ORDER — SODIUM CHLORIDE 0.9% FLUSH: 3.0000 mL | INTRAVENOUS | Status: DC | PRN

## 2023-08-19 MED ORDER — EPHEDRINE SULFATE-NACL 50-0.9 MG/10ML-% IV SOSY
PREFILLED_SYRINGE | INTRAVENOUS | Status: DC | PRN
  Administered 2023-08-19: 5 mg via INTRAVENOUS

## 2023-08-19 MED ORDER — PROPOFOL 500 MG/50ML IV EMUL
INTRAVENOUS | Status: DC | PRN
  Administered 2023-08-19: 100 ug/kg/min via INTRAVENOUS
  Administered 2023-08-19: 120 ug/kg/min via INTRAVENOUS

## 2023-08-19 MED ORDER — SODIUM CHLORIDE 0.9% FLUSH: 3.0000 mL | Freq: Two times a day (BID) | INTRAVENOUS | Status: DC

## 2023-08-19 MED ORDER — PROPOFOL 10 MG/ML IV BOLUS
INTRAVENOUS | Status: DC | PRN
  Administered 2023-08-19: 120 mg via INTRAVENOUS
  Administered 2023-08-19: 20 mg via INTRAVENOUS

## 2023-08-19 MED ORDER — LACTATED RINGERS IV SOLN: INTRAVENOUS | Status: DC | PRN

## 2023-08-19 MED ORDER — LIDOCAINE HCL (CARDIAC) PF 100 MG/5ML IV SOSY
PREFILLED_SYRINGE | INTRAVENOUS | Status: DC | PRN
  Administered 2023-08-19: 80 mg via INTRATRACHEAL

## 2023-08-19 NOTE — Anesthesia Postprocedure Evaluation (Signed)
 Anesthesia Post Note  Patient: Peggy Smith  Procedure(s) Performed: COLONOSCOPY WITH PROPOFOL  ESOPHAGOGASTRODUODENOSCOPY (EGD) WITH PROPOFOL  BIOPSY POLYPECTOMY  Patient location during evaluation: PACU Anesthesia Type: General Level of consciousness: awake and alert Pain management: pain level controlled Vital Signs Assessment: post-procedure vital signs reviewed and stable Respiratory status: spontaneous breathing, nonlabored ventilation, respiratory function stable and patient connected to nasal cannula oxygen Cardiovascular status: blood pressure returned to baseline and stable Postop Assessment: no apparent nausea or vomiting Anesthetic complications: no   There were no known notable events for this encounter.   Last Vitals:  Vitals:   08/19/23 1156 08/19/23 1200  BP: (!) 78/52 (!) 99/49  Pulse: 61   Resp: 16   Temp: 36.4 C   SpO2: 96%     Last Pain:  Vitals:   08/19/23 1156  TempSrc: Oral  PainSc: 0-No pain                 Julea Hutto L Katesha Eichel

## 2023-08-19 NOTE — Anesthesia Procedure Notes (Signed)
 Date/Time: 08/19/2023 11:11 AM  Performed by: Barbarann Verneita RAMAN, CRNAPre-anesthesia Checklist: Patient identified, Emergency Drugs available, Suction available, Timeout performed and Patient being monitored Patient Re-evaluated:Patient Re-evaluated prior to induction Oxygen Delivery Method: Nasal Cannula

## 2023-08-19 NOTE — Transfer of Care (Signed)
 Immediate Anesthesia Transfer of Care Note  Patient: Peggy Smith  Procedure(s) Performed: COLONOSCOPY WITH PROPOFOL  ESOPHAGOGASTRODUODENOSCOPY (EGD) WITH PROPOFOL  BIOPSY POLYPECTOMY  Patient Location: Endoscopy Unit  Anesthesia Type:General  Level of Consciousness: drowsy and patient cooperative  Airway & Oxygen Therapy: Patient Spontanous Breathing  Post-op Assessment: Report given to RN and Post -op Vital signs reviewed and stable  Post vital signs: Reviewed and stable  Last Vitals:  Vitals Value Taken Time  BP 99/49 08/19/23 1200  Temp 36.4 C 08/19/23 1156  Pulse 61 08/19/23 1156  Resp 16 08/19/23 1156  SpO2 96 % 08/19/23 1156    Last Pain:  Vitals:   08/19/23 1156  TempSrc: Oral  PainSc: 0-No pain      Patients Stated Pain Goal: 7 (08/19/23 1001)  Complications: No notable events documented.

## 2023-08-19 NOTE — Anesthesia Preprocedure Evaluation (Signed)
 Anesthesia Evaluation  Patient identified by MRN, date of birth, ID band Patient awake    Reviewed: Allergy & Precautions, H&P , NPO status , Patient's Chart, lab work & pertinent test results, reviewed documented beta blocker date and time   History of Anesthesia Complications (+) PONV and history of anesthetic complications  Airway Mallampati: II  TM Distance: >3 FB Neck ROM: full    Dental no notable dental hx. (+) Chipped,    Pulmonary Current Smoker   Pulmonary exam normal breath sounds clear to auscultation       Cardiovascular Exercise Tolerance: Good hypertension, + angina  + CAD  Normal cardiovascular exam+ dysrhythmias  Rhythm:regular Rate:Normal     Neuro/Psych  Headaches PSYCHIATRIC DISORDERS Anxiety Depression       GI/Hepatic ,GERD  ,,(+) Hepatitis -  Endo/Other  Hypothyroidism    Renal/GU negative Renal ROS  negative genitourinary   Musculoskeletal   Abdominal   Peds  Hematology negative hematology ROS (+) Blood dyscrasia, anemia   Anesthesia Other Findings   Reproductive/Obstetrics negative OB ROS                             Anesthesia Physical Anesthesia Plan  ASA: 3  Anesthesia Plan: General   Post-op Pain Management: Minimal or no pain anticipated   Induction: Intravenous  PONV Risk Score and Plan: Propofol  infusion  Airway Management Planned: Nasal Cannula and Natural Airway  Additional Equipment: None  Intra-op Plan:   Post-operative Plan:   Informed Consent: I have reviewed the patients History and Physical, chart, labs and discussed the procedure including the risks, benefits and alternatives for the proposed anesthesia with the patient or authorized representative who has indicated his/her understanding and acceptance.     Dental Advisory Given  Plan Discussed with: CRNA  Anesthesia Plan Comments:        Anesthesia Quick Evaluation

## 2023-08-19 NOTE — H&P (Signed)
 Primary Care Physician:  Gladis Mustard, FNP Primary Gastroenterologist:  Dr. Cindie  Pre-Procedure History & Physical: HPI:  Peggy Smith is a 78 y.o. female is here for an EGD to be performed for GERD, iron  deficiency anemia, and colonoscopy for positive Cologuard testing/colon cancer screening  Past Medical History:  Diagnosis Date   Angina    STRESS TEST NORMAL-ANXIETY   Anxiety    Cardiac arrhythmia due to congenital heart disease    Depression    GERD (gastroesophageal reflux disease)    Headache(784.0)    OTC MEDS   Hepatitis     1976   Hyperlipidemia    Hypertension    Hypothyroidism    Iron  deficiency anemia    Melanoma (HCC)    Panic attacks    PONV (postoperative nausea and vomiting)     Past Surgical History:  Procedure Laterality Date   ABDOMINAL HYSTERECTOMY     COLONOSCOPY     CYSTOSCOPY  11/08/2011   Procedure: CYSTOSCOPY;  Surgeon: Jon CINDERELLA Rummer, MD;  Location: [facility];  Service: Gynecology;  Laterality: N/A;   DILATION AND CURETTAGE OF UTERUS     LAPAROSCOPIC HYSTERECTOMY  11/08/2011   Procedure: HYSTERECTOMY TOTAL LAPAROSCOPIC;  Surgeon: Jon CINDERELLA Rummer, MD;  Location: [facility];  Service: Gynecology;  Laterality: N/A;  with Bilateral Salpingo-Oophorectomies   MELANOMA EXCISION     Left Shoulder   SVD      X 4   TUBAL LIGATION     UPPER GASTROINTESTINAL ENDOSCOPY      Prior to Admission medications   Medication Sig Start Date End Date Taking? Authorizing Provider  amLODipine  (NORVASC ) 5 MG tablet TAKE 1 TABLET BY MOUTH EVERY DAY 04/22/23  Yes Gladis, [name], FNP  aspirin 81 MG tablet Take 81 mg by mouth every morning.   Yes [provider]  atorvastatin  (LIPITOR) 40 MG tablet Take 1 tablet (40 mg total) by mouth 2 (two) times a week. Take Pablo Cagey 04/24/23  Yes Martin, [name], FNP  Bempedoic Acid-Ezetimibe  (NEXLIZET ) 180-10 MG TABS Take 1 tablet by mouth daily. 03/03/23  Yes Conte, Tessa N, PA-C  calcium  carbonate  (OS-CAL) 600 MG TABS Take 600 mg by mouth every evening.   Yes [provider]  Cholecalciferol (VITAMIN D -3) 5000 UNITS TABS Take 5,000 Units by mouth every evening.   Yes [provider]  clonazePAM  (KLONOPIN ) 0.5 MG tablet Take 1 tablet (0.5 mg total) by mouth 2 (two) times daily as needed for anxiety. 04/22/23  Yes Martin, [name], FNP  escitalopram  (LEXAPRO ) 20 MG tablet TAKE 1 TABLET BY MOUTH EVERYDAY AT BEDTIME 04/22/23  Yes Martin, [name], FNP  levothyroxine  (SYNTHROID ) 88 MCG tablet Take 1 tablet (88 mcg total) by mouth daily. 04/22/23  Yes Martin, [name], FNP  Magnesium  250 MG TABS Take 250 mg by mouth daily.   Yes [provider]  Multiple Vitamins-Minerals (CENTRUM SILVER PO) Take 1 tablet by mouth every morning.   Yes [provider]  omeprazole  (PRILOSEC) 40 MG capsule Take 1 capsule (40 mg total) by mouth daily. 04/22/23  Yes Martin, [name], FNP  Riboflavin (B-2) 100 MG TABS Take 100 mg by mouth daily.   Yes [provider]  vitamin C (ASCORBIC ACID) 250 MG tablet Take 250 mg by mouth daily.   Yes [provider]  acetaminophen  (TYLENOL ) 500 MG tablet Take 500 mg by mouth at bedtime.    [provider]  Ferrous Sulfate (IRON ) 325 (65 Fe) MG TABS Take by mouth.  Takes Monday, Wednesday, and Friday    [provider]  fluticasone  (FLONASE ) 50 MCG/ACT nasal spray SPRAY 2 SPRAYS INTO EACH NOSTRIL EVERY DAY 04/07/23   Gladis Mustard, FNP  Sod Picosulfate-Mag Ox-Cit Acd (CLENPIQ ) 10-3.5-12 MG-GM -GM/175ML SOLN Take 1 kit by mouth as directed. 07/25/23   Cindie Carlin POUR, DO  White Petrolatum (VASELINE EX) Apply 1 Application topically daily as needed (cracked skin).    [provider]    Allergies as of 07/25/2023 - Review Complete 07/07/2023  Allergen Reaction Noted   Crestor [rosuvastatin]  01/19/2013   Macrodantin Other (See Comments) 01/14/2011   Morphine Nausea And Vomiting  09/14/2022   Sulfa antibiotics Other (See Comments) 01/14/2011   Codeine Nausea And Vomiting 05/17/2011    Family History  Problem Relation Age of Onset   Dementia Mother    Brain cancer Father    Anxiety disorder Brother    Asthma Son    Colon cancer Neg Hx    Breast cancer Neg Hx     Social History   Socioeconomic History   Marital status: Widowed    Spouse name: Bebe   Number of children: 4   Years of education: Not on file   Highest education level: Master's degree (e.g., MA, MS, MEng, MEd, MSW, MBA)  Occupational History   Occupation: paralegal    Employer: [employer]    Comment: quit 2021 - thinking of starting back part time  Tobacco Use   Smoking status: Every Day    Current packs/day: 0.25    Average packs/day: 0.3 packs/day for 30.0 years (7.5 ttl pk-yrs)    Types: Cigarettes   Smokeless tobacco: Never  Vaping Use   Vaping status: Never Used  Substance and Sexual Activity   Alcohol use: No   Drug use: No   Sexual activity: Yes    Birth control/protection: Surgical  Other Topics Concern   Not on file  Social History Narrative   Lives alone, but one son stays with her a lot - Daughter lives in [place], other children live nearby   Social Drivers of Health   Financial Resource Strain: Low Risk  (01/13/2023)   Overall Financial Resource Strain (CARDIA)    Difficulty of Paying Living Expenses: Not hard at all  Food Insecurity: No Food Insecurity (01/13/2023)   Hunger Vital Sign    Worried About Running Out of Food in the Last Year: Never true    Ran Out of Food in the Last Year: Never true  Transportation Needs: No Transportation Needs (01/13/2023)   PRAPARE - Administrator, Civil Service (Medical): No    Lack of Transportation (Non-Medical): No  Physical Activity: Insufficiently Active (01/11/2022)   Exercise Vital Sign    Days of Exercise per Week: 7 days    Minutes of Exercise per Session: 10 min  Stress: No Stress Concern Present  (01/13/2023)   [employer] of Occupational Health - Occupational Stress Questionnaire    Feeling of Stress : Not at all  Social Connections: Moderately Isolated (01/13/2023)   Social Connection and Isolation Panel [NHANES]    Frequency of Communication with Friends and Family: More than three times a week    Frequency of Social Gatherings with Friends and Family: More than three times a week    Attends Religious Services: More than 4 times per year    Active Member of [employer] or Organizations: No    Attends Banker Meetings: Never    Marital  Status: Widowed  Intimate Partner Violence: Not At Risk (01/13/2023)   Humiliation, Afraid, Rape, and Kick questionnaire    Fear of Current or Ex-Partner: No    Emotionally Abused: No    Physically Abused: No    Sexually Abused: No    Review of Systems: General: Negative for fever, chills, fatigue, weakness. Eyes: Negative for vision changes.  ENT: Negative for hoarseness, difficulty swallowing , nasal congestion. CV: Negative for chest pain, angina, palpitations, dyspnea on exertion, peripheral edema.  Respiratory: Negative for dyspnea at rest, dyspnea on exertion, cough, sputum, wheezing.  GI: See history of present illness. GU:  Negative for dysuria, hematuria, urinary incontinence, urinary frequency, nocturnal urination.  MS: Negative for joint pain, low back pain.  Derm: Negative for rash or itching.  Neuro: Negative for weakness, abnormal sensation, seizure, frequent headaches, memory loss, confusion.  Psych: Negative for anxiety, depression Endo: Negative for unusual weight change.  Heme: Negative for bruising or bleeding. Allergy: Negative for rash or hives.  Physical Exam: Vital signs in last 24 hours: Temp:  [98.4 F (36.9 C)] 98.4 F (36.9 C) (01/14 1001) Pulse Rate:  [76] 76 (01/14 1001) Resp:  [18] 18 (01/14 1001) BP: (136)/(76) 136/76 (01/14 1001) SpO2:  [96 %] 96 % (01/14 1001) Weight:  [70.8 kg] 70.8 kg  (01/14 1001)   General:   Alert,  Well-developed, well-nourished, pleasant and cooperative in NAD Head:  Normocephalic and atraumatic. Eyes:  Sclera clear, no icterus.   Conjunctiva pink. Ears:  Normal auditory acuity. Nose:  No deformity, discharge,  or lesions. Msk:  Symmetrical without gross deformities. Normal posture. Extremities:  Without clubbing or edema. Neurologic:  Alert and  oriented x4;  grossly normal neurologically. Skin:  Intact without significant lesions or rashes. Psych:  Alert and cooperative. Normal mood and affect.   Impression/Plan: Peggy Smith is here for an EGD to be performed for GERD, iron  deficiency anemia, and colonoscopy for positive Cologuard testing/colon cancer screening.  Risks, benefits, limitations, imponderables and alternatives regarding procedure have been reviewed with the patient. Questions have been answered. All parties agreeable.

## 2023-08-19 NOTE — Op Note (Signed)
 [facility] Patient Name: Peggy Smith Procedure Date: 08/19/2023 11:07 AM MRN: [id number] Date of Birth: 11/19/1945 Attending MD: Carlin POUR. Cindie , [place], [id number] CSN: [id number] Age: 78 Admit Type: Outpatient Procedure:                Upper GI endoscopy Indications:              Iron  deficiency anemia, Heartburn Providers:                Carlin POUR. Cindie, DO, Leandrew Edelman RN, RN, Nidia Oak Referring MD:              Medicines:                See the Anesthesia note for documentation of the                            administered medications Complications:            No immediate complications. Estimated Blood Loss:     Estimated blood loss was minimal. Procedure:                Pre-Anesthesia Assessment:                           - The anesthesia plan was to use monitored                            anesthesia care (MAC).                           After obtaining informed consent, the endoscope was                            passed under direct vision. Throughout the                            procedure, the patient's blood pressure, pulse, and                            oxygen saturations were monitored continuously. The                            [id number]-H190 ([id number]) scope was introduced through the                            mouth, and advanced to the second part of duodenum.                            The upper GI endoscopy was accomplished without                            difficulty. The patient tolerated the procedure                            well. Scope In:  11:20:24 AM Scope Out: 11:23:09 AM Total Procedure Duration: 0 hours 2 minutes 45 seconds  Findings:      The examined esophagus was normal.      The Z-line was regular and was found 41 cm from the incisors.      Patchy minimal inflammation characterized by erythema was found in the       gastric body. Biopsies were taken with a cold forceps for Helicobacter       pylori  testing.      The duodenal bulb, first portion of the duodenum and second portion of       the duodenum were normal. Impression:               - Normal esophagus.                           - Z-line regular, 41 cm from the incisors.                           - Gastritis. Biopsied.                           - Normal duodenal bulb, first portion of the                            duodenum and second portion of the duodenum. Moderate Sedation:      Per Anesthesia Care Recommendation:           - Patient has a contact number available for                            emergencies. The signs and symptoms of potential                            delayed complications were discussed with the                            patient. Return to normal activities tomorrow.                            Written discharge instructions were provided to the                            patient.                           - Resume previous diet.                           - Continue present medications.                           - Await pathology results.                           - Use a proton pump inhibitor PO daily. Procedure Code(s):        --- Professional ---  56760, Esophagogastroduodenoscopy, flexible,                            transoral; with biopsy, single or multiple Diagnosis Code(s):        --- Professional ---                           K29.70, Gastritis, unspecified, without bleeding                           D50.9, Iron  deficiency anemia, unspecified                           R12, Heartburn CPT copyright 2022 American [employer] Association. All rights reserved. The codes documented in this report are preliminary and upon coder review may  be revised to meet current compliance requirements. Carlin POUR. Cindie, DO Carlin POUR. Cindie, DO 08/19/2023 11:25:10 AM This report has been signed electronically. Number of Addenda: 0

## 2023-08-19 NOTE — Op Note (Signed)
 [facility] Patient Name: Peggy Smith Procedure Date: 08/19/2023 11:07 AM MRN: [id number] Date of Birth: [birth year] Attending MD: Carlin POUR. Cindie , [place], [id number] CSN: [id number] Age: 78 Admit Type: Outpatient Procedure:                Colonoscopy Indications:              Screening for colorectal malignant neoplasm,                            Incidental - Positive Cologuard test Providers:                Carlin POUR. Cindie, DO, Leandrew Edelman RN, RN, Nidia Oak Referring MD:              Medicines:                See the Anesthesia note for documentation of the                            administered medications Complications:            No immediate complications. Estimated Blood Loss:     Estimated blood loss was minimal. Procedure:                Pre-Anesthesia Assessment:                           - The anesthesia plan was to use monitored                            anesthesia care (MAC).                           - The anesthesia plan was to use monitored                            anesthesia care (MAC).                           After obtaining informed consent, the colonoscope                            was passed under direct vision. Throughout the                            procedure, the patient's blood pressure, pulse, and                            oxygen saturations were monitored continuously. The                            [id number]-[id number] ([id number]) scope was introduced through                            the anus and advanced to the the cecum, identified  by appendiceal orifice and ileocecal valve. The                            colonoscopy was performed without difficulty. The                            patient tolerated the procedure well. The quality                            of the bowel preparation was evaluated using the                            BBPS [facility] Bowel Preparation Scale) with scores                             of: Right Colon = 2 (minor amount of residual                            staining, small fragments of stool and/or opaque                            liquid, but mucosa seen well), Transverse Colon = 2                            (minor amount of residual staining, small fragments                            of stool and/or opaque liquid, but mucosa seen                            well) and Left Colon = 2 (minor amount of residual                            staining, small fragments of stool and/or opaque                            liquid, but mucosa seen well). The total BBPS score                            equals 6. The quality of the bowel preparation was                            good. Scope In: 11:28:01 AM Scope Out: 11:52:24 AM Scope Withdrawal Time: 0 hours 21 minutes 16 seconds  Total Procedure Duration: 0 hours 24 minutes 23 seconds  Findings:      Non-bleeding internal hemorrhoids were found during endoscopy.      Multiple medium-mouthed and small-mouthed diverticula were found in the       sigmoid colon and descending colon.      A 6 mm polyp was found in the transverse colon. The polyp was sessile.       The polyp was removed with a cold snare. Resection and retrieval were       complete.  A 5 mm polyp was found in the sigmoid colon. The polyp was       semi-pedunculated. The polyp was removed with a cold snare. Resection       and retrieval were complete.      Localized moderate inflammation characterized by congestion (edema) and       erythema was found in the sigmoid colon approx 20 cm from anal verge.       Medium size polyp/nodule? (see pictures) Biopsies were taken with a cold       forceps for histology. Impression:               - Non-bleeding internal hemorrhoids.                           - Diverticulosis in the sigmoid colon and in the                            descending colon.                           - One 6 mm polyp in the  transverse colon, removed                            with a cold snare. Resected and retrieved.                           - One 5 mm polyp in the sigmoid colon, removed with                            a cold snare. Resected and retrieved.                           - Localized moderate inflammation was found in the                            sigmoid colon. Biopsied. Moderate Sedation:      Per Anesthesia Care Recommendation:           - Patient has a contact number available for                            emergencies. The signs and symptoms of potential                            delayed complications were discussed with the                            patient. Return to normal activities tomorrow.                            Written discharge instructions were provided to the                            patient.                           -  Resume previous diet.                           - Continue present medications.                           - Await pathology results.                           - Repeat colonoscopy date to be determined after                            pending pathology results are reviewed for                            surveillance based on pathology results.                           - Return to GI clinic in 3 months. Procedure Code(s):        --- Professional ---                           [phone], Colonoscopy, flexible; with removal of                            tumor(s), polyp(s), or other lesion(s) by snare                            technique                           45380, 59, Colonoscopy, flexible; with biopsy,                            single or multiple Diagnosis Code(s):        --- Professional ---                           Z12.11, Encounter for screening for malignant                            neoplasm of colon                           K64.8, Other hemorrhoids                           D12.3, Benign neoplasm of transverse colon (hepatic                             flexure or splenic flexure)                           D12.5, Benign neoplasm of sigmoid colon                           K52.9, Noninfective gastroenteritis and colitis,  unspecified                           K57.30, Diverticulosis of large intestine without                            perforation or abscess without bleeding CPT copyright 2022 American Medical Association. All rights reserved. The codes documented in this report are preliminary and upon coder review may  be revised to meet current compliance requirements. Carlin POUR. Cindie, DO Carlin POUR. Cindie, DO 08/19/2023 11:56:42 AM This report has been signed electronically. Number of Addenda: 0

## 2023-08-19 NOTE — Discharge Instructions (Addendum)
 EGD Discharge instructions Please read the instructions outlined below and refer to this sheet in the next few weeks. These discharge instructions provide you with general information on caring for yourself after you leave the hospital. Your doctor may also give you specific instructions. While your treatment has been planned according to the most current medical practices available, unavoidable complications occasionally occur. If you have any problems or questions after discharge, please call your doctor. ACTIVITY You may resume your regular activity but move at a slower pace for the next 24 hours.  Take frequent rest periods for the next 24 hours.  Walking will help expel (get rid of) the air and reduce the bloated feeling in your abdomen.  No driving for 24 hours (because of the anesthesia (medicine) used during the test).  You may shower.  Do not sign any important legal documents or operate any machinery for 24 hours (because of the anesthesia used during the test).  NUTRITION Drink plenty of fluids.  You may resume your normal diet.  Begin with a light meal and progress to your normal diet.  Avoid alcoholic beverages for 24 hours or as instructed by your caregiver.  MEDICATIONS You may resume your normal medications unless your caregiver tells you otherwise.  WHAT YOU CAN EXPECT TODAY You may experience abdominal discomfort such as a feeling of fullness or "gas" pains.  FOLLOW-UP Your doctor will discuss the results of your test with you.  SEEK IMMEDIATE MEDICAL ATTENTION IF ANY OF THE FOLLOWING OCCUR: Excessive nausea (feeling sick to your stomach) and/or vomiting.  Severe abdominal pain and distention (swelling).  Trouble swallowing.  Temperature over 101 F (37.8 C).  Rectal bleeding or vomiting of blood.      Colonoscopy Discharge Instructions  Read the instructions outlined below and refer to this sheet in the next few weeks. These discharge instructions provide you  with general information on caring for yourself after you leave the hospital. Your doctor may also give you specific instructions. While your treatment has been planned according to the most current medical practices available, unavoidable complications occasionally occur.   ACTIVITY You may resume your regular activity, but move at a slower pace for the next 24 hours.  Take frequent rest periods for the next 24 hours.  Walking will help get rid of the air and reduce the bloated feeling in your belly (abdomen).  No driving for 24 hours (because of the medicine (anesthesia) used during the test).   Do not sign any important legal documents or operate any machinery for 24 hours (because of the anesthesia used during the test).  NUTRITION Drink plenty of fluids.  You may resume your normal diet as instructed by your doctor.  Begin with a light meal and progress to your normal diet. Heavy or fried foods are harder to digest and may make you feel sick to your stomach (nauseated).  Avoid alcoholic beverages for 24 hours or as instructed.  MEDICATIONS You may resume your normal medications unless your doctor tells you otherwise.  WHAT YOU CAN EXPECT TODAY Some feelings of bloating in the abdomen.  Passage of more gas than usual.  Spotting of blood in your stool or on the toilet paper.  IF YOU HAD POLYPS REMOVED DURING THE COLONOSCOPY: No aspirin products for 7 days or as instructed.  No alcohol for 7 days or as instructed.  Eat a soft diet for the next 24 hours.  FINDING OUT THE RESULTS OF YOUR TEST Not all test results  are available during your visit. If your test results are not back during the visit, make an appointment with your caregiver to find out the results. Do not assume everything is normal if you have not heard from your caregiver or the medical facility. It is important for you to follow up on all of your test results.  SEEK IMMEDIATE MEDICAL ATTENTION IF: You have more than a  spotting of blood in your stool.  Your belly is swollen (abdominal distention).  You are nauseated or vomiting.  You have a temperature over 101.  You have abdominal pain or discomfort that is severe or gets worse throughout the day.   Your EGD revealed mild amount inflammation in your stomach.  I took biopsies of this to rule out infection with a bacteria called H. pylori.  Esophagus and small bowel appeared normal.  Continue on omeprazole  daily.  Your colonoscopy revealed 2 polyp(s) which I removed successfully.   There was an area of the left side your colon that was congested and inflamed.  I took samples of this region as well.  Await pathology results, my office will contact you.  You also have diverticulosis and internal hemorrhoids. I would recommend increasing fiber in your diet or adding OTC Benefiber/Metamucil. Be sure to drink at least 4 to 6 glasses of water  daily. Follow-up with GI in 2-3 months. Office will notify you.    I hope you have a great rest of your week!  Carlin POUR. Cindie, D.O. Gastroenterology and Hepatology [facility]

## 2023-08-20 LAB — SURGICAL PATHOLOGY

## 2023-08-21 ENCOUNTER — Encounter (HOSPITAL_COMMUNITY): Payer: Self-pay | Admitting: Internal Medicine

## 2023-09-10 ENCOUNTER — Inpatient Hospital Stay: Payer: Medicare PPO | Attending: Oncology

## 2023-09-10 DIAGNOSIS — F17210 Nicotine dependence, cigarettes, uncomplicated: Secondary | ICD-10-CM | POA: Diagnosis not present

## 2023-09-10 DIAGNOSIS — D509 Iron deficiency anemia, unspecified: Secondary | ICD-10-CM | POA: Insufficient documentation

## 2023-09-10 DIAGNOSIS — Z79899 Other long term (current) drug therapy: Secondary | ICD-10-CM | POA: Diagnosis not present

## 2023-09-10 DIAGNOSIS — R109 Unspecified abdominal pain: Secondary | ICD-10-CM | POA: Diagnosis not present

## 2023-09-10 LAB — CBC WITH DIFFERENTIAL/PLATELET
Abs Immature Granulocytes: 0.02 10*3/uL (ref 0.00–0.07)
Basophils Absolute: 0.1 10*3/uL (ref 0.0–0.1)
Basophils Relative: 1 %
Eosinophils Absolute: 0.1 10*3/uL (ref 0.0–0.5)
Eosinophils Relative: 1 %
HCT: 42.5 % (ref 36.0–46.0)
Hemoglobin: 13.9 g/dL (ref 12.0–15.0)
Immature Granulocytes: 0 %
Lymphocytes Relative: 30 %
Lymphs Abs: 2.4 10*3/uL (ref 0.7–4.0)
MCH: 28.8 pg (ref 26.0–34.0)
MCHC: 32.7 g/dL (ref 30.0–36.0)
MCV: 88 fL (ref 80.0–100.0)
Monocytes Absolute: 0.5 10*3/uL (ref 0.1–1.0)
Monocytes Relative: 7 %
Neutro Abs: 4.9 10*3/uL (ref 1.7–7.7)
Neutrophils Relative %: 61 %
Platelets: 398 10*3/uL (ref 150–400)
RBC: 4.83 MIL/uL (ref 3.87–5.11)
RDW: 13.7 % (ref 11.5–15.5)
WBC: 8.1 10*3/uL (ref 4.0–10.5)
nRBC: 0 % (ref 0.0–0.2)

## 2023-09-10 LAB — IRON AND TIBC
Iron: 93 ug/dL (ref 28–170)
Saturation Ratios: 24 % (ref 10.4–31.8)
TIBC: 396 ug/dL (ref 250–450)
UIBC: 303 ug/dL

## 2023-09-10 LAB — FERRITIN: Ferritin: 112 ng/mL (ref 11–307)

## 2023-09-17 ENCOUNTER — Inpatient Hospital Stay: Payer: Medicare PPO | Admitting: Oncology

## 2023-09-17 VITALS — BP 135/70 | HR 85 | Temp 98.2°F | Resp 18 | Wt 172.0 lb

## 2023-09-17 DIAGNOSIS — Z720 Tobacco use: Secondary | ICD-10-CM

## 2023-09-17 DIAGNOSIS — R109 Unspecified abdominal pain: Secondary | ICD-10-CM | POA: Diagnosis not present

## 2023-09-17 DIAGNOSIS — D75839 Thrombocytosis, unspecified: Secondary | ICD-10-CM

## 2023-09-17 DIAGNOSIS — D509 Iron deficiency anemia, unspecified: Secondary | ICD-10-CM

## 2023-09-17 DIAGNOSIS — R1030 Lower abdominal pain, unspecified: Secondary | ICD-10-CM | POA: Diagnosis not present

## 2023-09-17 DIAGNOSIS — Z79899 Other long term (current) drug therapy: Secondary | ICD-10-CM | POA: Diagnosis not present

## 2023-09-17 DIAGNOSIS — F17210 Nicotine dependence, cigarettes, uncomplicated: Secondary | ICD-10-CM | POA: Diagnosis not present

## 2023-09-17 NOTE — Assessment & Plan Note (Addendum)
Likely secondary to iron deficiency.  Negative JAK2 testing.  Resolved at this time.

## 2023-09-17 NOTE — Patient Instructions (Signed)
VISIT SUMMARY:  During today's visit, we discussed your recent stomach and colon inflammation, your resolved iron deficiency anemia, and your recent abdominal pain. We reviewed your current medications and supplements, and provided guidance on managing your symptoms.  YOUR PLAN:  -GASTRITIS AND COLITIS: Gastritis and colitis are conditions that cause inflammation in the stomach and colon, respectively. Your biopsies showed inflammation but no cancer in the polyps that were removed. Please continue taking Omeprazole as prescribed by your gastroenterologist and follow up with them as planned.  -IRON DEFICIENCY ANEMIA: Iron deficiency anemia occurs when your body doesn't have enough iron to produce adequate red blood cells. Your iron levels and platelet count have returned to normal with your current iron supplementation. Please continue taking these supplements three times a week.  -ABDOMINAL PAIN: You have experienced recent abdominal pain, which may be related to constipation. There have been no changes in your bowel movements or diet to explain the discomfort. If the pain persists, you may consider using over-the-counter MiraLAX for constipation relief. If the pain continues, please contact your primary care provider.  INSTRUCTIONS:  Please follow up in three months. If your lab results remain stable, we will plan to discharge you back to your primary care provider.

## 2023-09-17 NOTE — Assessment & Plan Note (Signed)
Mild iron deficiency noted on labs. Patient currently taking oral iron supplementation every other day.  Patient received a total of 800 mg of Venofer so far.  Significantly improved iron labs. -Repeat colonoscopy and endoscopy showed gastritis and colitis.  No active bleeding. -Continue oral iron every other day. -Repeat CBC, iron panel, ferritin in 3 months.  Return to clinic in 3 months with labs

## 2023-09-17 NOTE — Assessment & Plan Note (Addendum)
30-year history of smoking, currently 5-9 cigarettes daily. -Patient is not ready for quitting yet

## 2023-09-17 NOTE — Progress Notes (Signed)
[facility] at [facility] HEMATOLOGY FOLLOW-UP VISIT  [name], [name], FNP  REASON FOR FOLLOW-UP: Thrombocytosis  ASSESSMENT & PLAN:  Patient is a 78 year old female with iron deficiency anemia referred for thrombocytosis.  IDA (iron deficiency anemia) Mild iron deficiency noted on labs. Patient currently taking oral iron supplementation every other day.  Patient received a total of 800 mg of Venofer so far.  Significantly improved iron labs. -Repeat colonoscopy and endoscopy showed gastritis and colitis.  No active bleeding. -Continue oral iron every other day. -Repeat CBC, iron panel, ferritin in 3 months.  Return to clinic in 3 months with labs  Thrombocytosis Likely secondary to iron deficiency.  Negative JAK2 testing.  Resolved at this time.  Abdominal pain Recent onset, possibly related to constipation. No nausea or vomiting. -Consider over-the-counter MiraLAX for constipation if symptoms persist. -If pain continues, reach out to primary care provider.  Tobacco use 30-year history of smoking, currently 5-9 cigarettes daily. -Patient is not ready for quitting yet    Orders Placed This Encounter  Procedures   Ferritin    Standing Status:   Future    Expected Date:   12/08/2023    Expiration Date:   09/16/2024   CBC with Differential/Platelet    Standing Status:   Future    Expected Date:   12/08/2023    Expiration Date:   09/16/2024   Comprehensive metabolic panel    Standing Status:   Future    Expected Date:   12/08/2023    Expiration Date:   09/16/2024   Iron and TIBC    Standing Status:   Future    Expected Date:   12/08/2023    Expiration Date:   09/16/2024    The total time spent in the appointment was 20 minutes encounter with patients including review of chart and various tests results, discussions about plan of care and coordination of care plan   All questions were answered. The patient knows to call the clinic with any problems,  questions or concerns. No barriers to learning was detected.  Cindie Crumbly, MD 2/12/20253:15 PM    INTERVAL HISTORY: Peggy Smith 78 y.o. female is here for follow-up for her thrombocytosis and iron deficiency anemia.  She reports intermittent discomfort, describing herself as a "lazy sloth" at times.  The patient's iron deficiency has been corrected, and her platelet count has returned to normal. She is taking iron and vitamin C three times a week, which appears to be maintaining her improved iron levels.  The patient also reports abdominal pain that began on a recent Saturday. She describes it as cramping, but denies any changes in bowel movements or diet that could explain the discomfort. The pain has since lessened, but she was advised to reach out to her primary care provider if it persists.   I have reviewed the past medical history, past surgical history, social history and family history with the patient   ALLERGIES:  is allergic to crestor [rosuvastatin], macrodantin, morphine, sulfa antibiotics, and codeine.  MEDICATIONS:  Current Outpatient Medications  Medication Sig Dispense Refill   acetaminophen (TYLENOL) 500 MG tablet Take 500 mg by mouth at bedtime.     amLODipine (NORVASC) 5 MG tablet TAKE 1 TABLET BY MOUTH EVERY DAY 90 tablet 1   aspirin 81 MG tablet Take 81 mg by mouth every morning.     atorvastatin (LIPITOR) 40 MG tablet Take 1 tablet (40 mg total) by mouth 2 (two) times a week. Take  Mon, Thur 90 tablet 0   Bempedoic Acid-Ezetimibe (NEXLIZET) 180-10 MG TABS Take 1 tablet by mouth daily. 90 tablet 3   calcium carbonate (OS-CAL) 600 MG TABS Take 600 mg by mouth every evening.     Cholecalciferol (VITAMIN D-3) 5000 UNITS TABS Take 5,000 Units by mouth every evening.     clonazePAM (KLONOPIN) 0.5 MG tablet Take 1 tablet (0.5 mg total) by mouth 2 (two) times daily as needed for anxiety. 60 tablet 5   escitalopram (LEXAPRO) 20 MG tablet TAKE 1 TABLET BY MOUTH  EVERYDAY AT BEDTIME 90 tablet 1   Ferrous Sulfate (IRON) 325 (65 Fe) MG TABS Take by mouth. Takes Monday, Wednesday, and Friday     fluticasone (FLONASE) 50 MCG/ACT nasal spray SPRAY 2 SPRAYS INTO EACH NOSTRIL EVERY DAY 48 mL 1   levothyroxine (SYNTHROID) 88 MCG tablet Take 1 tablet (88 mcg total) by mouth daily. 90 tablet 1   Magnesium 250 MG TABS Take 250 mg by mouth daily.     Multiple Vitamins-Minerals (CENTRUM SILVER PO) Take 1 tablet by mouth every morning.     omeprazole (PRILOSEC) 40 MG capsule Take 1 capsule (40 mg total) by mouth daily. 90 capsule 1   Riboflavin (B-2) 100 MG TABS Take 100 mg by mouth daily.     vitamin C (ASCORBIC ACID) 250 MG tablet Take 250 mg by mouth daily. Monday, Wednesday, and Friday     White Petrolatum (VASELINE EX) Apply 1 Application topically daily as needed (cracked skin).     No current facility-administered medications for this visit.     REVIEW OF SYSTEMS:   Constitutional: Denies fevers, chills or night sweats Eyes: Denies blurriness of vision Ears, nose, mouth, throat, and face: Denies mucositis or sore throat Respiratory: Denies cough, dyspnea or wheezes Cardiovascular: Denies palpitation, chest discomfort or lower extremity swelling Gastrointestinal:  Denies nausea, heartburn or change in bowel habits Skin: Denies abnormal skin rashes Lymphatics: Denies new lymphadenopathy or easy bruising Neurological:Denies numbness, tingling or new weaknesses Behavioral/Psych: Mood is stable, no new changes  All other systems were reviewed with the patient and are negative.  PHYSICAL EXAMINATION:   Vitals:   09/17/23 1401  BP: 135/70  Pulse: 85  Resp: 18  Temp: 98.2 F (36.8 C)  SpO2: 97%    GENERAL:alert, no distress and comfortable SKIN: skin color, texture, turgor are normal, no rashes or significant lesions LUNGS: clear to auscultation and percussion with normal breathing effort HEART: regular rate & rhythm and no murmurs and no lower  extremity edema ABDOMEN:abdomen soft, non-tender and normal bowel sounds Musculoskeletal:no cyanosis of digits and no clubbing  NEURO: alert & oriented x 3 with fluent speech  LABORATORY DATA:  I have reviewed the data as listed  Lab Results  Component Value Date   WBC 8.1 09/10/2023   NEUTROABS 4.9 09/10/2023   HGB 13.9 09/10/2023   HCT 42.5 09/10/2023   MCV 88.0 09/10/2023   PLT 398 09/10/2023       Chemistry      Component Value Date/Time   NA 139 04/22/2023 1439   K 4.4 04/22/2023 1439   CL 99 04/22/2023 1439   CO2 28 04/22/2023 1439   BUN 15 04/22/2023 1439   CREATININE 0.92 04/22/2023 1439   CREATININE 0.98 10/16/2012 1720      Component Value Date/Time   CALCIUM 10.0 04/22/2023 1439   ALKPHOS 85 04/22/2023 1439   AST 15 04/22/2023 1439   ALT 10 04/22/2023 1439   BILITOT  0.2 04/22/2023 1439       Latest Reference Range & Units 05/28/23 14:56 09/10/23 13:19  Iron 28 - 170 ug/dL 82 93  UIBC ug/dL 409 811  TIBC 914 - 782 ug/dL 956 213  %SAT 20 - 55 %    Saturation Ratios 10.4 - 31.8 % 19 24  Ferritin 11 - 307 ng/mL 15 112   Procedures: Colonoscopy: 08/19/23: Impression: - Non- bleeding internal hemorrhoids. - Diverticulosis in the sigmoid colon and in the descending colon. - One 6 mm polyp in the transverse colon, removed with a cold snare. Resected and retrieved. - One 5 mm polyp in the sigmoid colon, removed with a cold snare. Resected and retrieved. - Localized moderate inflammation was found in the sigmoid colon. Biopsied.  Endoscopy: 08/19/23: Impression: - Normal esophagus. - Z- line regular, 41 cm from the incisors. - Gastritis. Biopsied. - Normal duodenal bulb, first portion of the duodenum and second portion of the duodenum.  Pathology: FINAL MICROSCOPIC DIAGNOSIS:   A. 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       No H. pylori identified on HE stain.       Negative for intestinal metaplasia or  dysplasia.   B. COLON, TRANSVERSE, POLYPECTOMY:       Tubular adenoma.       Negative for high-grade dysplasia.   C. COLON, SIGMOID INFLAMMATION, BIOPSY:       Active colitis.       Negative for chronicity, granuloma, dysplasia or malignancy.   D. COLON, SIGMOID, POLYPECTOMY:       Tubular adenoma.       Negative for high-grade dysplasia.

## 2023-09-17 NOTE — Assessment & Plan Note (Signed)
Recent onset, possibly related to constipation. No nausea or vomiting. -Consider over-the-counter MiraLAX for constipation if symptoms persist. -If pain continues, reach out to primary care provider.

## 2023-10-02 ENCOUNTER — Other Ambulatory Visit: Payer: Self-pay | Admitting: Nurse Practitioner

## 2023-10-02 DIAGNOSIS — J301 Allergic rhinitis due to pollen: Secondary | ICD-10-CM

## 2023-10-13 ENCOUNTER — Encounter: Payer: Self-pay | Admitting: Gastroenterology

## 2023-10-20 ENCOUNTER — Ambulatory Visit (INDEPENDENT_AMBULATORY_CARE_PROVIDER_SITE_OTHER): Payer: Medicare PPO | Admitting: Nurse Practitioner

## 2023-10-20 ENCOUNTER — Encounter: Payer: Self-pay | Admitting: Nurse Practitioner

## 2023-10-20 VITALS — BP 107/67 | HR 89 | Temp 98.2°F | Ht 68.0 in | Wt 158.8 lb

## 2023-10-20 DIAGNOSIS — Z720 Tobacco use: Secondary | ICD-10-CM

## 2023-10-20 DIAGNOSIS — K219 Gastro-esophageal reflux disease without esophagitis: Secondary | ICD-10-CM

## 2023-10-20 DIAGNOSIS — I2510 Atherosclerotic heart disease of native coronary artery without angina pectoris: Secondary | ICD-10-CM | POA: Diagnosis not present

## 2023-10-20 DIAGNOSIS — F411 Generalized anxiety disorder: Secondary | ICD-10-CM | POA: Diagnosis not present

## 2023-10-20 DIAGNOSIS — Z6829 Body mass index (BMI) 29.0-29.9, adult: Secondary | ICD-10-CM | POA: Diagnosis not present

## 2023-10-20 DIAGNOSIS — D509 Iron deficiency anemia, unspecified: Secondary | ICD-10-CM | POA: Diagnosis not present

## 2023-10-20 DIAGNOSIS — R739 Hyperglycemia, unspecified: Secondary | ICD-10-CM

## 2023-10-20 DIAGNOSIS — R7309 Other abnormal glucose: Secondary | ICD-10-CM | POA: Diagnosis not present

## 2023-10-20 DIAGNOSIS — I10 Essential (primary) hypertension: Secondary | ICD-10-CM | POA: Diagnosis not present

## 2023-10-20 DIAGNOSIS — F410 Panic disorder [episodic paroxysmal anxiety] without agoraphobia: Secondary | ICD-10-CM | POA: Diagnosis not present

## 2023-10-20 DIAGNOSIS — E039 Hypothyroidism, unspecified: Secondary | ICD-10-CM

## 2023-10-20 DIAGNOSIS — E782 Mixed hyperlipidemia: Secondary | ICD-10-CM | POA: Diagnosis not present

## 2023-10-20 MED ORDER — OMEPRAZOLE 40 MG PO CPDR: 40.0000 mg | DELAYED_RELEASE_CAPSULE | Freq: Every day | ORAL | 1 refills | Status: DC

## 2023-10-20 MED ORDER — ESCITALOPRAM OXALATE 20 MG PO TABS: ORAL_TABLET | ORAL | 1 refills | Status: DC

## 2023-10-20 MED ORDER — ATORVASTATIN CALCIUM 40 MG PO TABS: 40.0000 mg | ORAL_TABLET | ORAL | 1 refills | Status: DC

## 2023-10-20 MED ORDER — CLONAZEPAM 0.5 MG PO TABS: 0.5000 mg | ORAL_TABLET | Freq: Two times a day (BID) | ORAL | 5 refills | Status: DC | PRN

## 2023-10-20 MED ORDER — LEVOTHYROXINE SODIUM 88 MCG PO TABS: 88.0000 ug | ORAL_TABLET | Freq: Every day | ORAL | 1 refills | Status: DC

## 2023-10-20 MED ORDER — AMLODIPINE BESYLATE 5 MG PO TABS: ORAL_TABLET | ORAL | 1 refills | Status: DC

## 2023-10-20 NOTE — Progress Notes (Signed)
 Subjective:    Patient ID: Peggy Smith, female    DOB: April 07, 1946, 78 y.o.   MRN: [id number]   Chief Complaint: medical management of chronic issues     HPI:  Peggy Smith is a 78 y.o. who identifies as a female who was assigned female at birth.   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 Primary hypertension No c/o chest pain, sob or headache. Does not check blood pressure at home. BP Readings from Last 3 Encounters:  09/17/23 135/70  08/19/23 (!) 99/49  07/07/23 138/77     2. Coronary artery disease involving native coronary artery of native heart without angina pectoris Last saw cardiology on 03/03/23. Review of office note revealed no change in plan of care.  3. Mixed hyperlipidemia Doe snot really watch diet and does no exercise at all. Lab Results  Component Value Date   CHOL 95 (L) 04/22/2023   HDL 38 (L) 04/22/2023   LDLCALC 30 04/22/2023   TRIG 164 (H) 04/22/2023   CHOLHDL 2.5 04/22/2023     4. Gastroesophageal reflux disease without esophagitis Is on omeprazole and is doing well.  5. Iron deficiency anemia, unspecified iron deficiency anemia type No c/o fatigue Lab Results  Component Value Date   HGB 13.9 09/10/2023     6. Anxiety state Is on klonopin bid. Stays anxious    04/22/2023    2:18 PM 10/21/2022   12:10 PM 04/22/2022   12:31 PM 10/15/2021   12:18 PM  GAD 7 : Generalized Anxiety Score  Nervous, Anxious, on Edge 0 0 0 0  Control/stop worrying 0 0 0 0  Worry too much - different things 0 0 0 0  Trouble relaxing 0 0 0 0  Restless 0 0 0 0  Easily annoyed or irritable 0 0 0 0  Afraid - awful might happen 0 0 0 0  Total GAD 7 Score 0 0 0 0  Anxiety Difficulty Not difficult at all Not difficult at all Not difficult at all Not difficult at all      7. Panic attacks Is on lexapro and  is doing well.    04/22/2023    2:17 PM 01/13/2023    3:36 PM 10/21/2022   12:10 PM  Depression screen PHQ 2/9  Decreased Interest 0 0 0  Down, Depressed, Hopeless 0 0 0  PHQ - 2 Score 0 0 0  Altered sleeping 1  0  Tired, decreased energy 1  0  Change in appetite 0    Feeling bad or failure about yourself  0  0  Trouble concentrating 0  0  Moving slowly or fidgety/restless 0  0  Suicidal thoughts 0  0  PHQ-9 Score 2  0  Difficult doing work/chores Not difficult at all       8. Tobacco abuse Smokes at least a pack a day  9. BMI 29.0-29.9,adult Weight is down 14lbs  Wt Readings from Last 3 Encounters:  10/20/23 158 lb 12.8 oz (72 kg)  09/17/23 172 lb (78 kg)  08/19/23 156 lb (70.8 kg)   BMI Readings from Last 3 Encounters:  10/20/23 24.15 kg/m  09/17/23 26.15 kg/m  08/19/23 23.72 kg/m       New complaints: None today  Allergies  Allergen Reactions   Crestor [Rosuvastatin]     Muscle aches  Macrodantin Other (See Comments)    Liver shut down   Morphine Nausea And Vomiting   Sulfa Antibiotics Other (See Comments)    agitation   Codeine Nausea And Vomiting        Outpatient Encounter Medications as of 10/20/2023  Medication Sig   acetaminophen (TYLENOL) 500 MG tablet Take 500 mg by mouth at bedtime.   amLODipine (NORVASC) 5 MG tablet TAKE 1 TABLET BY MOUTH EVERY DAY   aspirin 81 MG tablet Take 81 mg by mouth every morning.   atorvastatin (LIPITOR) 40 MG tablet Take 1 tablet (40 mg total) by mouth 2 (two) times a week. Take Mon, Thur   Bempedoic Acid-Ezetimibe (NEXLIZET) 180-10 MG TABS Take 1 tablet by mouth daily.   calcium carbonate (OS-CAL) 600 MG TABS Take 600 mg by mouth every evening.   Cholecalciferol (VITAMIN D-3) 5000 UNITS TABS Take 5,000 Units by mouth every evening.   clonazePAM (KLONOPIN) 0.5 MG tablet Take 1 tablet (0.5 mg total) by mouth 2 (two) times daily as needed for anxiety.   escitalopram (LEXAPRO) 20 MG tablet TAKE 1 TABLET BY MOUTH  EVERYDAY AT BEDTIME   Ferrous Sulfate (IRON) 325 (65 Fe) MG TABS Take by mouth. Takes Monday, Wednesday, and Friday   fluticasone (FLONASE) 50 MCG/ACT nasal spray SPRAY 2 SPRAYS INTO EACH NOSTRIL EVERY DAY   levothyroxine (SYNTHROID) 88 MCG tablet Take 1 tablet (88 mcg total) by mouth daily.   Magnesium 250 MG TABS Take 250 mg by mouth daily.   Multiple Vitamins-Minerals (CENTRUM SILVER PO) Take 1 tablet by mouth every morning.   omeprazole (PRILOSEC) 40 MG capsule Take 1 capsule (40 mg total) by mouth daily.   Riboflavin (B-2) 100 MG TABS Take 100 mg by mouth daily.   vitamin C (ASCORBIC ACID) 250 MG tablet Take 250 mg by mouth daily. Monday, Wednesday, and Friday   White Petrolatum (VASELINE EX) Apply 1 Application topically daily as needed (cracked skin).   No facility-administered encounter medications on file as of 10/20/2023.    Past Surgical History:  Procedure Laterality Date   ABDOMINAL HYSTERECTOMY     BIOPSY  08/19/2023   Procedure: BIOPSY;  Surgeon: Lanelle Bal, DO;  Location: [facility];  Service: Endoscopy;;   COLONOSCOPY     COLONOSCOPY WITH PROPOFOL N/A 08/19/2023   Procedure: COLONOSCOPY WITH PROPOFOL;  Surgeon: Lanelle Bal, DO;  Location: [facility];  Service: Endoscopy;  Laterality: N/A;  12:45pm, asa 2   CYSTOSCOPY  11/08/2011   Procedure: CYSTOSCOPY;  Surgeon: Purcell Nails, MD;  Location: [facility];  Service: Gynecology;  Laterality: N/A;   DILATION AND CURETTAGE OF UTERUS     ESOPHAGOGASTRODUODENOSCOPY (EGD) WITH PROPOFOL N/A 08/19/2023   Procedure: ESOPHAGOGASTRODUODENOSCOPY (EGD) WITH PROPOFOL;  Surgeon: Lanelle Bal, DO;  Location: [facility];  Service: Endoscopy;  Laterality: N/A;   LAPAROSCOPIC HYSTERECTOMY  11/08/2011   Procedure: HYSTERECTOMY TOTAL LAPAROSCOPIC;  Surgeon: Purcell Nails, MD;  Location: [facility];  Service: Gynecology;  Laterality: N/A;  with Bilateral Salpingo-Oophorectomies   MELANOMA EXCISION     Left Shoulder    POLYPECTOMY  08/19/2023   Procedure: POLYPECTOMY;  Surgeon: Lanelle Bal, DO;  Location: [facility];  Service: Endoscopy;;   SVD      X 4   TUBAL LIGATION     UPPER GASTROINTESTINAL ENDOSCOPY      Family History  Problem Relation Age of Onset   Dementia Mother    Brain cancer Father  Anxiety disorder Brother    Asthma Son    Colon cancer Neg Hx    Breast cancer Neg Hx       Controlled substance contract: n/a     Review of Systems  Constitutional:  Negative for diaphoresis.  Eyes:  Negative for pain.  Respiratory:  Negative for shortness of breath.   Cardiovascular:  Negative for chest pain, palpitations and leg swelling.  Gastrointestinal:  Negative for abdominal pain.  Endocrine: Negative for polydipsia.  Skin:  Negative for rash.  Neurological:  Negative for dizziness, weakness and headaches.  Hematological:  Does not bruise/bleed easily.  All other systems reviewed and are negative.      Objective:   Physical Exam Vitals and nursing note reviewed.  Constitutional:      General: She is not in acute distress.    Appearance: Normal appearance. She is well-developed.  HENT:     Head: Normocephalic.     Right Ear: Tympanic membrane normal.     Left Ear: Tympanic membrane normal.     Nose: Nose normal.     Mouth/Throat:     Mouth: Mucous membranes are moist.  Eyes:     Pupils: Pupils are equal, round, and reactive to light.  Neck:     Vascular: No carotid bruit or JVD.  Cardiovascular:     Rate and Rhythm: Normal rate and regular rhythm.     Heart sounds: Normal heart sounds.  Pulmonary:     Effort: Pulmonary effort is normal. No respiratory distress.     Breath sounds: Normal breath sounds. No wheezing or rales.  Chest:     Chest wall: No tenderness.  Abdominal:     General: Bowel sounds are normal. There is no distension or abdominal bruit.     Palpations: Abdomen is soft. There is no hepatomegaly, splenomegaly, mass or pulsatile mass.      Tenderness: There is no abdominal tenderness.  Musculoskeletal:        General: Normal range of motion.     Cervical back: Normal range of motion and neck supple.  Lymphadenopathy:     Cervical: No cervical adenopathy.  Skin:    General: Skin is warm and dry.  Neurological:     Mental Status: She is alert and oriented to person, place, and time.     Deep Tendon Reflexes: Reflexes are normal and symmetric.  Psychiatric:        Behavior: Behavior normal.        Thought Content: Thought content normal.        Judgment: Judgment normal.     BP 107/67   Pulse 89   Temp 98.2 F (36.8 C)   Ht 5\' 8"  (1.727 m)   Wt 158 lb 12.8 oz (72 kg)   SpO2 97%   BMI 24.15 kg/m         Assessment & Plan:   Peggy Smith comes in today with chief complaint of Medical Management of Chronic Issues (Pt has had lingering cough for about a week.)   Diagnosis and orders addressed:  1. Primary hypertension Low sodium diet - amLODipine (NORVASC) 5 MG tablet; TAKE 1 TABLET BY MOUTH EVERY DAY  Dispense: 90 tablet; Refill: 1  2. Coronary artery disease involving native coronary artery of native heart without angina pectoris Keep follow up with cardiology  3. Mixed hyperlipidemia Low fat diet - atorvastatin (LIPITOR) 40 MG tablet; Take 1 tablet (40 mg total) by mouth 2 (two) times a week.  Take Nance Pew  Dispense: 90 tablet; Refill: 0  4. Gastroesophageal reflux disease without esophagitis Avoid spicy foods Do not eat 2 hours prior to bedtime  - omeprazole (PRILOSEC) 40 MG capsule; Take 1 capsule (40 mg total) by mouth daily.  Dispense: 90 capsule; Refill: 1  5. Iron deficiency anemia, unspecified iron deficiency anemia type Labs pending  6. Anxiety state Stress management - ToxASSURE Select 13 (MW), Urine - clonazePAM (KLONOPIN) 0.5 MG tablet; Take 1 tablet (0.5 mg total) by mouth 2 (two) times daily as needed for anxiety.  Dispense: 60 tablet; Refill: 5  7. Panic attacks -  escitalopram (LEXAPRO) 20 MG tablet; TAKE 1 TABLET BY MOUTH EVERYDAY AT BEDTIME  Dispense: 90 tablet; Refill: 1  8. Tobacco abuse Smoking cessation encouraged  9. BMI 29.0-29.9,adult Discussed diet and exercise for person with BMI >25 Will recheck weight in 3-6 months   10. Acquired hypothyroidism Labs pending - levothyroxine (SYNTHROID) 88 MCG tablet; Take 1 tablet (88 mcg total) by mouth daily.  Dispense: 90 tablet; Refill: 1   Labs pending Health Maintenance reviewed Diet and exercise encouraged  Follow up plan: 6 months   [name] [name], FNP

## 2023-10-20 NOTE — Patient Instructions (Signed)
 Fall Prevention in the Home, Adult Falls can cause injuries and can happen to people of all ages. There are many things you can do to make your home safer and to help prevent falls. What actions can I take to prevent falls? General information Use good lighting in all rooms. Make sure to: Replace any light bulbs that burn out. Turn on the lights in dark areas and use night-lights. Keep items that you use often in easy-to-reach places. Lower the shelves around your home if needed. Move furniture so that there are clear paths around it. Do not use throw rugs or other things on the floor that can make you trip. If any of your floors are uneven, fix them. Add color or contrast paint or tape to clearly mark and help you see: Grab bars or handrails. First and last steps of staircases. Where the edge of each step is. If you use a ladder or stepladder: Make sure that it is fully opened. Do not climb a closed ladder. Make sure the sides of the ladder are locked in place. Have someone hold the ladder while you use it. Know where your pets are as you move through your home. What can I do in the bathroom?     Keep the floor dry. Clean up any water on the floor right away. Remove soap buildup in the bathtub or shower. Buildup makes bathtubs and showers slippery. Use non-skid mats or decals on the floor of the bathtub or shower. Attach bath mats securely with double-sided, non-slip rug tape. If you need to sit down in the shower, use a non-slip stool. Install grab bars by the toilet and in the bathtub and shower. Do not use towel bars as grab bars. What can I do in the bedroom? Make sure that you have a light by your bed that is easy to reach. Do not use any sheets or blankets on your bed that hang to the floor. Have a firm chair or bench with side arms that you can use for support when you get dressed. What can I do in the kitchen? Clean up any spills right away. If you need to reach something  above you, use a step stool with a grab bar. Keep electrical cords out of the way. Do not use floor polish or wax that makes floors slippery. What can I do with my stairs? Do not leave anything on the stairs. Make sure that you have a light switch at the top and the bottom of the stairs. Make sure that there are handrails on both sides of the stairs. Fix handrails that are broken or loose. Install non-slip stair treads on all your stairs if they do not have carpet. Avoid having throw rugs at the top or bottom of the stairs. Choose a carpet that does not hide the edge of the steps on the stairs. Make sure that the carpet is firmly attached to the stairs. Fix carpet that is loose or worn. What can I do on the outside of my home? Use bright outdoor lighting. Fix the edges of walkways and driveways and fix any cracks. Clear paths of anything that can make you trip, such as tools or rocks. Add color or contrast paint or tape to clearly mark and help you see anything that might make you trip as you walk through a door, such as a raised step or threshold. Trim any bushes or trees on paths to your home. Check to see if handrails are loose  or broken and that both sides of all steps have handrails. Install guardrails along the edges of any raised decks and porches. Have leaves, snow, or ice cleared regularly. Use sand, salt, or ice melter on paths if you live where there is ice and snow during the winter. Clean up any spills in your garage right away. This includes grease or oil spills. What other actions can I take? Review your medicines with your doctor. Some medicines can cause dizziness or changes in blood pressure, which increase your risk of falling. Wear shoes that: Have a low heel. Do not wear high heels. Have rubber bottoms and are closed at the toe. Feel good on your feet and fit well. Use tools that help you move around if needed. These include: Canes. Walkers. Scooters. Crutches. Ask  your doctor what else you can do to help prevent falls. This may include seeing a physical therapist to learn to do exercises to move better and get stronger. Where to find more information Centers for Disease Control and Prevention, STEADI: TonerPromos.no [employer] on Aging: BaseRingTones.pl National Institute on Aging: BaseRingTones.pl Contact a doctor if: You are afraid of falling at home. You feel weak, drowsy, or dizzy at home. You fall at home. Get help right away if you: Lose consciousness or have trouble moving after a fall. Have a fall that causes a head injury. These symptoms may be an emergency. Get help right away. Call 911. Do not wait to see if the symptoms will go away. Do not drive yourself to the hospital. This information is not intended to replace advice given to you by your health care provider. Make sure you discuss any questions you have with your health care provider. Document Revised: 03/25/2022 Document Reviewed: 03/25/2022 Elsevier Patient Education  2024 [employer].

## 2023-10-21 LAB — CBC WITH DIFFERENTIAL/PLATELET
Basophils Absolute: 0.1 10*3/uL (ref 0.0–0.2)
Basos: 1 %
EOS (ABSOLUTE): 0.1 10*3/uL (ref 0.0–0.4)
Eos: 1 %
Hematocrit: 39.4 % (ref 34.0–46.6)
Hemoglobin: 12.9 g/dL (ref 11.1–15.9)
Immature Grans (Abs): 0.1 10*3/uL (ref 0.0–0.1)
Immature Granulocytes: 1 %
Lymphocytes Absolute: 2.2 10*3/uL (ref 0.7–3.1)
Lymphs: 21 %
MCH: 29.1 pg (ref 26.6–33.0)
MCHC: 32.7 g/dL (ref 31.5–35.7)
MCV: 89 fL (ref 79–97)
Monocytes Absolute: 0.6 10*3/uL (ref 0.1–0.9)
Monocytes: 6 %
Neutrophils Absolute: 7.2 10*3/uL — ABNORMAL HIGH (ref 1.4–7.0)
Neutrophils: 70 %
Platelets: 410 10*3/uL (ref 150–450)
RBC: 4.43 x10E6/uL (ref 3.77–5.28)
RDW: 12.3 % (ref 11.7–15.4)
WBC: 10.3 10*3/uL (ref 3.4–10.8)

## 2023-10-21 LAB — CMP14+EGFR
ALT: 11 IU/L (ref 0–32)
AST: 18 IU/L (ref 0–40)
Albumin: 4.3 g/dL (ref 3.8–4.8)
Alkaline Phosphatase: 87 IU/L (ref 44–121)
BUN/Creatinine Ratio: 13 (ref 12–28)
BUN: 12 mg/dL (ref 8–27)
Bilirubin Total: <0.2 mg/dL (ref 0.0–1.2)
CO2: 26 mmol/L (ref 20–29)
Calcium: 9.5 mg/dL (ref 8.7–10.3)
Chloride: 103 mmol/L (ref 96–106)
Creatinine, Ser: 0.94 mg/dL (ref 0.57–1.00)
Globulin, Total: 2.5 g/dL (ref 1.5–4.5)
Glucose: 122 mg/dL — ABNORMAL HIGH (ref 70–99)
Potassium: 4.6 mmol/L (ref 3.5–5.2)
Sodium: 143 mmol/L (ref 134–144)
Total Protein: 6.8 g/dL (ref 6.0–8.5)
eGFR: 62 mL/min/{1.73_m2} (ref >59)

## 2023-10-21 LAB — THYROID PANEL WITH TSH
Free Thyroxine Index: 2.6 (ref 1.2–4.9)
T3 Uptake Ratio: 27 % (ref 24–39)
T4, Total: 9.8 ug/dL (ref 4.5–12.0)
TSH: 1.89 u[IU]/mL (ref 0.450–4.500)

## 2023-10-21 LAB — LIPID PANEL
Chol/HDL Ratio: 2.2 ratio (ref 0.0–4.4)
Cholesterol, Total: 101 mg/dL (ref 100–199)
HDL: 45 mg/dL (ref >39)
LDL Chol Calc (NIH): 37 mg/dL (ref 0–99)
Triglycerides: 103 mg/dL (ref 0–149)
VLDL Cholesterol Cal: 19 mg/dL (ref 5–40)

## 2023-10-21 NOTE — Addendum Note (Signed)
 Addended by: Bennie Pierini on: 10/21/2023 09:27 AM   Modules accepted: Orders

## 2023-10-22 LAB — HGB A1C W/O EAG: Hgb A1c MFr Bld: 5.6 % (ref 4.8–5.6)

## 2023-10-22 LAB — SPECIMEN STATUS REPORT

## 2023-10-23 ENCOUNTER — Encounter: Payer: Self-pay | Admitting: Internal Medicine

## 2023-10-24 ENCOUNTER — Other Ambulatory Visit (HOSPITAL_COMMUNITY): Payer: Self-pay

## 2023-10-24 ENCOUNTER — Encounter: Payer: Self-pay | Admitting: Oncology

## 2023-10-24 ENCOUNTER — Telehealth: Payer: Self-pay | Admitting: Pharmacy Technician

## 2023-10-24 NOTE — Telephone Encounter (Signed)
 Patient Advocate Encounter   The patient was approved for a [employer] grant that will help cover the cost of nexlizet Total amount awarded, 2500.  Effective: 09/24/23 - 09/22/24   [id number] NUU:[id number] [id number] [employer] ID: [id number]   Pharmacy provided with approval and processing information. Patient informed via telephone  I asked [facility] to refund the prescription she got on 10/21/23. They are going to refund her the $782.29 that she paid. I called the patient and had to leave her a message to make her aware she just has to go to [facility] to get her refund.

## 2023-11-05 ENCOUNTER — Encounter: Payer: Self-pay | Admitting: Nurse Practitioner

## 2023-11-05 ENCOUNTER — Ambulatory Visit (INDEPENDENT_AMBULATORY_CARE_PROVIDER_SITE_OTHER): Admitting: Nurse Practitioner

## 2023-11-05 ENCOUNTER — Ambulatory Visit: Payer: Self-pay

## 2023-11-05 ENCOUNTER — Ambulatory Visit (INDEPENDENT_AMBULATORY_CARE_PROVIDER_SITE_OTHER)

## 2023-11-05 VITALS — BP 116/65 | HR 97 | Temp 99.8°F | Ht 68.0 in | Wt 158.2 lb

## 2023-11-05 DIAGNOSIS — J40 Bronchitis, not specified as acute or chronic: Secondary | ICD-10-CM

## 2023-11-05 DIAGNOSIS — R0602 Shortness of breath: Secondary | ICD-10-CM

## 2023-11-05 DIAGNOSIS — R6889 Other general symptoms and signs: Secondary | ICD-10-CM | POA: Insufficient documentation

## 2023-11-05 DIAGNOSIS — R051 Acute cough: Secondary | ICD-10-CM

## 2023-11-05 LAB — VERITOR FLU A/B WAIVED
Influenza A: NEGATIVE
Influenza B: NEGATIVE

## 2023-11-05 MED ORDER — METHYLPREDNISOLONE 4 MG PO TBPK: ORAL_TABLET | ORAL | 0 refills | Status: DC

## 2023-11-05 MED ORDER — BENZONATATE 100 MG PO CAPS
100.0000 mg | ORAL_CAPSULE | Freq: Three times a day (TID) | ORAL | 0 refills | Status: DC | PRN
Start: 2023-11-05 — End: 2023-12-17

## 2023-11-05 MED ORDER — AZITHROMYCIN 250 MG PO TABS: ORAL_TABLET | ORAL | 0 refills | Status: DC

## 2023-11-05 MED ORDER — ALBUTEROL SULFATE HFA 108 (90 BASE) MCG/ACT IN AERS
2.0000 | INHALATION_SPRAY | Freq: Four times a day (QID) | RESPIRATORY_TRACT | 2 refills | Status: DC | PRN
Start: 2023-11-05 — End: 2023-12-17

## 2023-11-05 NOTE — Progress Notes (Signed)
 Acute Office Visit  Subjective:     Patient ID: Peggy Smith, female    DOB: 08-15-1945, 78 y.o.   MRN: [id number]  Chief Complaint  Patient presents with   Cough   Nasal Congestion   Shortness of Breath    HPI Peggy Smith is a 78 y.o. female present 11/05/2023 for an acute visit c/o  who complains of congestion, sore throat, cough described as productive of frothy sputum, myalgias, headache, fever, and SOB for 3 days. " I have coughing for 2 weeks and got better, 3 days ago the cough came came. I zero energy and no appetite. She denies a history of chest pain and vomiting and denies a history of asthma. Patient admits to smoke cigarettes smoking  8-10 cigarettes daily POC Flu A & B negative Acute Bronchitis: Patient presents for presents evaluation of dyspnea, fever, myalgias, nasal congestion, productive cough with sputum described as frothy, and wheezing. Symptoms began 3 days ago and are gradually worsening since that time.  Past history is significant for no history of pneumonia or bronchitis. She is  a smoker  Active Ambulatory Problems    Diagnosis Date Noted   Tobacco use 01/14/2011   HTN (hypertension) 01/14/2011   Panic attacks 01/19/2013   Anxiety state 01/19/2013   Mixed hyperlipidemia 01/19/2013   Hypothyroidism 01/19/2013   Seasonal allergic rhinitis 04/20/2013   IDA (iron deficiency anemia)    Melanoma (HCC)    GERD (gastroesophageal reflux disease)    BMI 29.0-29.9,adult 12/18/2018   Coronary artery disease involving native coronary artery of native heart without angina pectoris 06/06/2021   Acute cholecystitis 10/24/2022   Thrombocytosis 05/28/2023   Abdominal pain 09/17/2023   Flu-like symptoms 11/05/2023   Acute cough 11/05/2023   Resolved Ambulatory Problems    Diagnosis Date Noted   Precordial pain 01/14/2011   DOE (dyspnea on exertion) 01/14/2011   Pain in joint, ankle and foot 04/20/2013   Need for Tdap vaccination 11/09/2013   Past  Medical History:  Diagnosis Date   Angina    Anxiety    Cardiac arrhythmia due to congenital heart disease    Depression    Headache(784.0)    Hepatitis     Hyperlipidemia    Hypertension    Iron deficiency anemia    PONV (postoperative nausea and vomiting)      Review of Systems  Constitutional:  Positive for fever.  HENT:  Positive for congestion and sore throat.   Respiratory:  Positive for cough, sputum production, shortness of breath and wheezing.   Cardiovascular:  Negative for chest pain and leg swelling.  Gastrointestinal:  Negative for constipation, diarrhea, nausea and vomiting.  Skin:  Negative for rash.  Neurological:  Negative for dizziness and headaches.   Negative unless indicated in HPI    Objective:    BP 116/65   Pulse 97   Temp 99.8 F (37.7 C)   Ht 5\' 8"  (1.727 m)   Wt 158 lb 3.2 oz (71.8 kg)   SpO2 94%   BMI 24.05 kg/m  BP Readings from Last 3 Encounters:  11/05/23 116/65  10/20/23 107/67  09/17/23 135/70   Wt Readings from Last 3 Encounters:  11/05/23 158 lb 3.2 oz (71.8 kg)  10/20/23 158 lb 12.8 oz (72 kg)  09/17/23 172 lb (78 kg)      Physical Exam Vitals and nursing note reviewed.  Constitutional:      General: She is not in acute distress. HENT:  Head: Normocephalic and atraumatic.     Right Ear: Tympanic membrane, ear canal and external ear normal. There is no impacted cerumen.     Left Ear: Tympanic membrane, ear canal and external ear normal. There is no impacted cerumen.     Nose: Congestion present.     Mouth/Throat:     Mouth: Mucous membranes are moist.  Eyes:     General: No scleral icterus.    Extraocular Movements: Extraocular movements intact.     Conjunctiva/sclera: Conjunctivae normal.     Pupils: Pupils are equal, round, and reactive to light.  Cardiovascular:     Heart sounds: Normal heart sounds.  Pulmonary:     Breath sounds: Examination of the right-middle field reveals rhonchi. Examination of the  left-middle field reveals rhonchi. Examination of the right-lower field reveals rhonchi. Examination of the left-lower field reveals rhonchi. Wheezing and rhonchi present.  Musculoskeletal:        General: Normal range of motion.     Right lower leg: No edema.     Left lower leg: No edema.  Skin:    General: Skin is warm and dry.     Findings: No rash.  Neurological:     Mental Status: She is alert and oriented to person, place, and time.  Psychiatric:        Mood and Affect: Mood normal.        Behavior: Behavior normal.        Thought Content: Thought content normal.        Judgment: Judgment normal.   A Chest X-Ray was ordered. My reading of this film is preliminary. (No comparison films available: pending review by Radiologist.)  No results found for any visits on 11/05/23.      Assessment & Plan:  Flu-like symptoms -     Veritor Flu A/B Waived  Acute cough -     Benzonatate; Take 1 capsule (100 mg total) by mouth 3 (three) times daily as needed.  Dispense: 20 capsule; Refill: 0 -     Azithromycin; Take 2 -tabs on the first day and 1-tab until done  Dispense: 6 each; Refill: 0 -     Albuterol Sulfate HFA; Inhale 2 puffs into the lungs every 6 (six) hours as needed for wheezing or shortness of breath.  Dispense: 8 g; Refill: 2  SOB (shortness of breath) -     DG Chest 2 View -     Albuterol Sulfate HFA; Inhale 2 puffs into the lungs every 6 (six) hours as needed for wheezing or shortness of breath.  Dispense: 8 g; Refill: 2  Bronchitis -     Albuterol Sulfate HFA; Inhale 2 puffs into the lungs every 6 (six) hours as needed for wheezing or shortness of breath.  Dispense: 8 g; Refill: 2  Other orders -     methylPREDNISolone; Follow instruction on the box  Dispense: 21 tablet; Refill: 0  Peggy Smith is a 78 year old female seen today for possible acute bronchitis Prednisone dose pack 4mg  # 21 follow instruction on the box Z-pack 250 mg # 6 dispense Cough: tessalon pearls  SOB:  Albuterol Ventolin inhaler Symptomatic therapy suggested: push fluids, rest, encouraged very strongly to quit smoking, and return office visit prn if symptoms persist or worsen. Call or return to clinic prn if these symptoms worsen or fail to improve as anticipated.  Encourage healthy lifestyle choices, including diet (rich in fruits, vegetables, and lean proteins, and low in salt and simple carbohydrates) and  exercise (at least 30 minutes of moderate physical activity daily).     The above assessment and management plan was discussed with the patient. The patient verbalized understanding of and has agreed to the management plan. Patient is aware to call the clinic if they develop any new symptoms or if symptoms persist or worsen. Patient is aware when to return to the clinic for a follow-up visit. Patient educated on when it is appropriate to go to the emergency department.  Return if symptoms worsen or fail to improve.  Arrie Aran Santa Lighter, [facility] [address] [place], [place] [address] [phone]  Note: This document was prepared by [employer] voice dictation technology and any errors that results from this process are unintentional.

## 2023-11-05 NOTE — Telephone Encounter (Signed)
 If she is having difficulty breathing, a mychart virtual is not appropriate. Please make sure to offer her a DOD appt or advise to go to urgent care. She needs her lungs listened to

## 2023-11-05 NOTE — Telephone Encounter (Signed)
 Chief Complaint: Cough Symptoms: chest congestion Frequency: x 5 days Pertinent Negatives: Patient denies fever, CP Disposition: [] ED /[] Urgent Care (no appt availability in office) / [x] Appointment(In office/virtual)/ []  [facility]/ [] Home Care/ [] Refused Recommended Disposition /[] [facility]/ []  Follow-up with PCP Additional Notes: Pt reports she began with cough and chest congestion x 5 days ago, denies fever, CP. Notes SOB with exertion. OV scheduled today. This RN educated pt on home care, new-worsening symptoms, when to call back/seek emergent care. Pt verbalized understanding and agrees to plan.    Copied from CRM [id number]. Topic: Clinical - Red Word Triage >> Nov 05, 2023 12:50 PM Emylou G wrote: [facility] that prompted transfer to Nurse Triage: chest congestion that makes it hard to breathe .... no appetite  suggested to get a DOD.Marland Kitchen cough Reason for Disposition  [1] MILD difficulty breathing (e.g., minimal/no SOB at rest, SOB with walking, pulse <100) AND [2] still present when not coughing  Answer Assessment - Initial Assessment Questions 1. ONSET: "When did the cough begin?"      X 5 days 3. SPUTUM: "Describe the color of your sputum" (none, dry cough; clear, white, yellow, green)     Clear 4. HEMOPTYSIS: "Are you coughing up any blood?" If so ask: "How much?" (flecks, streaks, tablespoons, etc.)     None 5. DIFFICULTY BREATHING: "Are you having difficulty breathing?" If Yes, ask: "How bad is it?" (e.g., mild, moderate, severe)    - MILD: No SOB at rest, mild SOB with walking, speaks normally in sentences, can lie down, no retractions, pulse < 100.    - MODERATE: SOB at rest, SOB with minimal exertion and prefers to sit, cannot lie down flat, speaks in phrases, mild retractions, audible wheezing, pulse 100-120.    - SEVERE: Very SOB at rest, speaks in single words, struggling to breathe, sitting hunched forward, retractions, pulse > 120      Mild SOB with  exertion 6. FEVER: "Do you have a fever?" If Yes, ask: "What is your temperature, how was it measured, and when did it start?"     None  10. OTHER SYMPTOMS: "Do you have any other symptoms?" (e.g., runny nose, wheezing, chest pain)       Intermittent wheeze, mild HA  Protocols used: Cough - Acute Non-Productive-A-AH

## 2023-11-21 ENCOUNTER — Other Ambulatory Visit (HOSPITAL_COMMUNITY): Payer: Self-pay

## 2023-12-10 ENCOUNTER — Inpatient Hospital Stay: Payer: Medicare PPO | Attending: Oncology

## 2023-12-10 DIAGNOSIS — D509 Iron deficiency anemia, unspecified: Secondary | ICD-10-CM | POA: Insufficient documentation

## 2023-12-10 DIAGNOSIS — Z79899 Other long term (current) drug therapy: Secondary | ICD-10-CM | POA: Insufficient documentation

## 2023-12-10 DIAGNOSIS — D75839 Thrombocytosis, unspecified: Secondary | ICD-10-CM | POA: Diagnosis not present

## 2023-12-10 DIAGNOSIS — F17210 Nicotine dependence, cigarettes, uncomplicated: Secondary | ICD-10-CM | POA: Diagnosis not present

## 2023-12-10 LAB — IRON AND TIBC
Iron: 121 ug/dL (ref 28–170)
Saturation Ratios: 27 % (ref 10.4–31.8)
TIBC: 447 ug/dL (ref 250–450)
UIBC: 326 ug/dL

## 2023-12-10 LAB — CBC WITH DIFFERENTIAL/PLATELET
Abs Immature Granulocytes: 0.03 10*3/uL (ref 0.00–0.07)
Basophils Absolute: 0.1 10*3/uL (ref 0.0–0.1)
Basophils Relative: 1 %
Eosinophils Absolute: 0.1 10*3/uL (ref 0.0–0.5)
Eosinophils Relative: 2 %
HCT: 40.5 % (ref 36.0–46.0)
Hemoglobin: 13.4 g/dL (ref 12.0–15.0)
Immature Granulocytes: 0 %
Lymphocytes Relative: 30 %
Lymphs Abs: 2.3 10*3/uL (ref 0.7–4.0)
MCH: 29.6 pg (ref 26.0–34.0)
MCHC: 33.1 g/dL (ref 30.0–36.0)
MCV: 89.6 fL (ref 80.0–100.0)
Monocytes Absolute: 0.6 10*3/uL (ref 0.1–1.0)
Monocytes Relative: 7 %
Neutro Abs: 4.5 10*3/uL (ref 1.7–7.7)
Neutrophils Relative %: 60 %
Platelets: 454 10*3/uL — ABNORMAL HIGH (ref 150–400)
RBC: 4.52 MIL/uL (ref 3.87–5.11)
RDW: 14.2 % (ref 11.5–15.5)
WBC: 7.5 10*3/uL (ref 4.0–10.5)
nRBC: 0 % (ref 0.0–0.2)

## 2023-12-10 LAB — COMPREHENSIVE METABOLIC PANEL WITH GFR
ALT: 13 U/L (ref 0–44)
AST: 21 U/L (ref 15–41)
Albumin: 4.3 g/dL (ref 3.5–5.0)
Alkaline Phosphatase: 77 U/L (ref 38–126)
Anion gap: 11 (ref 5–15)
BUN: 18 mg/dL (ref 8–23)
CO2: 27 mmol/L (ref 22–32)
Calcium: 9.5 mg/dL (ref 8.9–10.3)
Chloride: 99 mmol/L (ref 98–111)
Creatinine, Ser: 0.84 mg/dL (ref 0.44–1.00)
GFR, Estimated: >60 mL/min (ref >60)
Glucose, Bld: 102 mg/dL — ABNORMAL HIGH (ref 70–99)
Potassium: 4.3 mmol/L (ref 3.5–5.1)
Sodium: 137 mmol/L (ref 135–145)
Total Bilirubin: 0.8 mg/dL (ref 0.0–1.2)
Total Protein: 7.3 g/dL (ref 6.5–8.1)

## 2023-12-10 LAB — FERRITIN: Ferritin: 39 ng/mL (ref 11–307)

## 2023-12-17 ENCOUNTER — Inpatient Hospital Stay: Payer: Medicare PPO | Admitting: Oncology

## 2023-12-17 ENCOUNTER — Encounter: Payer: Self-pay | Admitting: Oncology

## 2023-12-17 VITALS — BP 125/78 | HR 81 | Temp 98.2°F | Resp 19 | Ht 68.0 in | Wt 158.0 lb

## 2023-12-17 DIAGNOSIS — Z720 Tobacco use: Secondary | ICD-10-CM

## 2023-12-17 DIAGNOSIS — D500 Iron deficiency anemia secondary to blood loss (chronic): Secondary | ICD-10-CM | POA: Diagnosis not present

## 2023-12-17 DIAGNOSIS — D509 Iron deficiency anemia, unspecified: Secondary | ICD-10-CM | POA: Diagnosis not present

## 2023-12-17 DIAGNOSIS — Z79899 Other long term (current) drug therapy: Secondary | ICD-10-CM | POA: Diagnosis not present

## 2023-12-17 DIAGNOSIS — D75839 Thrombocytosis, unspecified: Secondary | ICD-10-CM

## 2023-12-17 DIAGNOSIS — F17210 Nicotine dependence, cigarettes, uncomplicated: Secondary | ICD-10-CM | POA: Diagnosis not present

## 2023-12-17 NOTE — Assessment & Plan Note (Addendum)
 Mild iron  deficiency noted on labs. Patient currently taking oral iron  supplementation every other day.  Patient received a total of 800 mg of Venofer  so far.  Significantly improved iron  labs. Repeat colonoscopy and endoscopy showed gastritis and colitis.  No active bleeding.  -Continue oral iron  every other day. -Repeat CBC, iron  panel, ferritin in 6 months.  Return to clinic in 6 months with labs

## 2023-12-17 NOTE — Assessment & Plan Note (Addendum)
 Likely secondary to iron  deficiency.  Negative JAK2 testing.  - Continue oral ferrous sulfate every other day  Repeat labs in 6 months

## 2023-12-17 NOTE — Patient Instructions (Signed)
 VISIT SUMMARY:  Today, we reviewed your iron  levels and platelet count. We discussed your recent fall, your history of gallbladder issues, and your knee pain. We also talked about your current iron  supplementation and dietary habits.  YOUR PLAN:  -IRON  DEFICIENCY ANEMIA: Iron  deficiency anemia means that your body does not have enough iron  to produce healthy red blood cells. Your ferritin level is 39, which is below the target. You should continue taking your iron  supplements on Monday, Wednesday, and Friday, along with vitamin C to help with absorption. Avoid taking iron  with coffee, but tea is acceptable. Try to include more red meat, protein, and greens with lime in your diet to improve your iron  levels. We will re-evaluate your iron  levels in six months.  INSTRUCTIONS:  Please follow up in six months for a re-evaluation of your iron  levels. Continue with your current iron  supplementation and dietary recommendations.

## 2023-12-17 NOTE — Progress Notes (Signed)
 [facility] at [facility]  HEMATOLOGY FOLLOW-UP VISIT  Gaylyn Keas, [name], FNP  REASON FOR FOLLOW-UP: Thrombocytosis  ASSESSMENT & PLAN:  Patient is a 78 year old female with iron  deficiency anemia referred for thrombocytosis.  IDA (iron  deficiency anemia) Mild iron  deficiency noted on labs. Patient currently taking oral iron  supplementation every other day.  Patient received a total of 800 mg of Venofer  so far.  Significantly improved iron  labs. Repeat colonoscopy and endoscopy showed gastritis and colitis.  No active bleeding.  -Continue oral iron  every other day. -Repeat CBC, iron  panel, ferritin in 6 months.  Return to clinic in 6 months with labs  Thrombocytosis Likely secondary to iron  deficiency.  Negative JAK2 testing.  - Continue oral ferrous sulfate every other day  Repeat labs in 6 months  Tobacco use 30-year history of smoking, currently 5-9 cigarettes daily.  -Patient is not ready for quitting yet - Advised tobacco cessation     Orders Placed This Encounter  Procedures   Ferritin    Standing Status:   Future    Expected Date:   06/14/2024    Expiration Date:   12/16/2024   CBC with Differential/Platelet    Standing Status:   Future    Expected Date:   06/14/2024    Expiration Date:   12/16/2024   Comprehensive metabolic panel with GFR    Standing Status:   Future    Expected Date:   06/14/2024    Expiration Date:   12/16/2024   Iron  and TIBC    Standing Status:   Future    Expected Date:   06/14/2024    Expiration Date:   12/16/2024    The total time spent in the appointment was 20 minutes encounter with patients including review of chart and various tests results, discussions about plan of care and coordination of care plan   All questions were answered. The patient knows to call the clinic with any problems, questions or concerns. No barriers to learning was detected.  Eduardo Grade, MD 5/14/20252:03 PM     INTERVAL HISTORY: SIANNE YZQUIERDO 78 y.o. female is here for follow-up for her thrombocytosis and iron  deficiency anemia.   She experienced a fall about a month ago, missing a chair but did not hit her head. Her middle son suggested a possible bone bruise, causing prolonged soreness, especially when lying on her back. Her daughter-in-law assisted her after the fall, and it required three people to help her up. No current bleeding or significant injury from the fall.  She has no other complaints today and is overall doing well.  I have reviewed the past medical history, past surgical history, social history and family history with the patient   ALLERGIES:  is allergic to crestor [rosuvastatin], macrodantin, morphine, sulfa antibiotics, and codeine.  MEDICATIONS:  Current Outpatient Medications  Medication Sig Dispense Refill   acetaminophen  (TYLENOL ) 500 MG tablet Take 500 mg by mouth at bedtime.     amLODipine  (NORVASC ) 5 MG tablet TAKE 1 TABLET BY MOUTH EVERY DAY 90 tablet 1   aspirin 81 MG tablet Take 81 mg by mouth every morning.     atorvastatin  (LIPITOR) 40 MG tablet Take 1 tablet (40 mg total) by mouth 2 (two) times a week. Take Mon, Thur 90 tablet 1   Bempedoic Acid-Ezetimibe  (NEXLIZET ) 180-10 MG TABS Take 1 tablet by mouth daily. 90 tablet 3   calcium  carbonate (OS-CAL) 600 MG TABS Take 600 mg by mouth every evening.  Cholecalciferol (VITAMIN D -3) 5000 UNITS TABS Take 5,000 Units by mouth every evening.     clonazePAM  (KLONOPIN ) 0.5 MG tablet Take 1 tablet (0.5 mg total) by mouth 2 (two) times daily as needed for anxiety. 60 tablet 5   escitalopram  (LEXAPRO ) 20 MG tablet TAKE 1 TABLET BY MOUTH EVERYDAY AT BEDTIME 90 tablet 1   Ferrous Sulfate (IRON ) 325 (65 Fe) MG TABS Take by mouth. Takes Monday, Wednesday, and Friday     fluticasone  (FLONASE ) 50 MCG/ACT nasal spray SPRAY 2 SPRAYS INTO EACH NOSTRIL EVERY DAY 48 mL 1   levothyroxine  (SYNTHROID ) 88 MCG tablet Take 1 tablet (88  mcg total) by mouth daily. 90 tablet 1   Magnesium  250 MG TABS Take 250 mg by mouth daily.     Multiple Vitamins-Minerals (CENTRUM SILVER PO) Take 1 tablet by mouth every morning.     omeprazole  (PRILOSEC) 40 MG capsule Take 1 capsule (40 mg total) by mouth daily. 90 capsule 1   Riboflavin (B-2) 100 MG TABS Take 100 mg by mouth daily.     vitamin C (ASCORBIC ACID) 250 MG tablet Take 250 mg by mouth daily. Monday, Wednesday, and Friday     White Petrolatum (VASELINE EX) Apply 1 Application topically daily as needed (cracked skin).     No current facility-administered medications for this visit.     REVIEW OF SYSTEMS:   Constitutional: Denies fevers, chills or night sweats Eyes: Denies blurriness of vision Ears, nose, mouth, throat, and face: Denies mucositis or sore throat Respiratory: Denies cough, dyspnea or wheezes Cardiovascular: Denies palpitation, chest discomfort or lower extremity swelling Gastrointestinal:  Denies nausea, heartburn or change in bowel habits Skin: Denies abnormal skin rashes Lymphatics: Denies new lymphadenopathy or easy bruising Neurological:Denies numbness, tingling or new weaknesses Behavioral/Psych: Mood is stable, no new changes  All other systems were reviewed with the patient and are negative.  PHYSICAL EXAMINATION:   Vitals:   12/17/23 1305  BP: 125/78  Pulse: 81  Resp: 19  Temp: 98.2 F (36.8 C)  SpO2: 96%    GENERAL:alert, no distress and comfortable SKIN: skin color, texture, turgor are normal, no rashes or significant lesions LUNGS: clear to auscultation and percussion with normal breathing effort HEART: regular rate & rhythm and no murmurs and no lower extremity edema ABDOMEN:abdomen soft, non-tender and normal bowel sounds Musculoskeletal:no cyanosis of digits and no clubbing  NEURO: alert & oriented x 3 with fluent speech  LABORATORY DATA:  I have reviewed the data as listed  Lab Results  Component Value Date   WBC 7.5  12/10/2023   NEUTROABS 4.5 12/10/2023   HGB 13.4 12/10/2023   HCT 40.5 12/10/2023   MCV 89.6 12/10/2023   PLT 454 (H) 12/10/2023       Chemistry      Component Value Date/Time   NA 137 12/10/2023 1309   NA 143 10/20/2023 1443   K 4.3 12/10/2023 1309   CL 99 12/10/2023 1309   CO2 27 12/10/2023 1309   BUN 18 12/10/2023 1309   BUN 12 10/20/2023 1443   CREATININE 0.84 12/10/2023 1309   CREATININE 0.98 10/16/2012 1720      Component Value Date/Time   CALCIUM  9.5 12/10/2023 1309   ALKPHOS 77 12/10/2023 1309   AST 21 12/10/2023 1309   ALT 13 12/10/2023 1309   BILITOT 0.8 12/10/2023 1309   BILITOT <0.2 10/20/2023 1443      Latest Reference Range & Units 12/10/23 13:08  Iron  28 - 170 ug/dL 784  UIBC ug/dL 433  TIBC 295 - 188 ug/dL 416  Saturation Ratios 10.4 - 31.8 % 27  Ferritin 11 - 307 ng/mL 39    Procedures: Colonoscopy: 08/19/23: Impression: - Non- bleeding internal hemorrhoids. - Diverticulosis in the sigmoid colon and in the descending colon. - One 6 mm polyp in the transverse colon, removed with a cold snare. Resected and retrieved. - One 5 mm polyp in the sigmoid colon, removed with a cold snare. Resected and retrieved. - Localized moderate inflammation was found in the sigmoid colon. Biopsied.  Endoscopy: 08/19/23: Impression: - Normal esophagus. - Z- line regular, 41 cm from the incisors. - Gastritis. Biopsied. - Normal duodenal bulb, first portion of the duodenum and second portion of the duodenum.  Pathology: FINAL MICROSCOPIC DIAGNOSIS:   A. 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       No H. pylori identified on HE stain.       Negative for intestinal metaplasia or dysplasia.   B. COLON, TRANSVERSE, POLYPECTOMY:       Tubular adenoma.       Negative for high-grade dysplasia.   C. COLON, SIGMOID INFLAMMATION, BIOPSY:       Active colitis.       Negative for chronicity, granuloma, dysplasia or malignancy.    D. COLON, SIGMOID, POLYPECTOMY:       Tubular adenoma.       Negative for high-grade dysplasia.

## 2023-12-17 NOTE — Assessment & Plan Note (Signed)
 30-year history of smoking, currently 5-9 cigarettes daily.  -Patient is not ready for quitting yet - Advised tobacco cessation

## 2023-12-18 NOTE — Telephone Encounter (Signed)
 FYI

## 2024-01-12 ENCOUNTER — Other Ambulatory Visit: Payer: Self-pay | Admitting: Nurse Practitioner

## 2024-01-12 DIAGNOSIS — K219 Gastro-esophageal reflux disease without esophagitis: Secondary | ICD-10-CM

## 2024-01-14 ENCOUNTER — Ambulatory Visit (INDEPENDENT_AMBULATORY_CARE_PROVIDER_SITE_OTHER): Payer: Medicare PPO

## 2024-01-14 VITALS — BP 125/78 | HR 81 | Ht 68.0 in | Wt 158.0 lb

## 2024-01-14 DIAGNOSIS — Z13820 Encounter for screening for osteoporosis: Secondary | ICD-10-CM

## 2024-01-14 DIAGNOSIS — Z0000 Encounter for general adult medical examination without abnormal findings: Secondary | ICD-10-CM | POA: Diagnosis not present

## 2024-01-14 NOTE — Progress Notes (Signed)
 Subjective:   Peggy Smith is a 78 y.o. who presents for a Medicare Wellness preventive visit.  As a reminder, Annual Wellness Visits don't include a physical exam, and some assessments may be limited, especially if this visit is performed virtually. We may recommend an in-person follow-up visit with your provider if needed.  Visit Complete: Virtual I connected with  Lyndall Sane on 01/14/24 by a audio enabled telemedicine application and verified that I am speaking with the correct person using two identifiers.  Patient Location: Home  Provider Location: Home Office  I discussed the limitations of evaluation and management by telemedicine. The patient expressed understanding and agreed to proceed.  Vital Signs: Because this visit was a virtual/telehealth visit, some criteria may be missing or patient reported. Any vitals not documented were not able to be obtained and vitals that have been documented are patient reported.  VideoDeclined- This patient declined Librarian, academic. Therefore the visit was completed with audio only.  Persons Participating in Visit: Patient.  AWV Questionnaire: No: Patient Medicare AWV questionnaire was not completed prior to this visit.  Cardiac Risk Factors include: advanced age (>7men, >61 women);dyslipidemia;hypertension     Objective:     Today's Vitals   01/14/24 1525  BP: 125/78  Pulse: 81  Weight: 158 lb (71.7 kg)  Height: 5' 8 (1.727 m)   Body mass index is 24.02 kg/m.     01/14/2024    2:17 PM 12/17/2023    1:04 PM 09/17/2023    2:04 PM 08/19/2023    9:59 AM 06/18/2023   10:19 AM 06/12/2023    9:40 AM 05/28/2023    2:31 PM  Advanced Directives  Does Patient Have a Medical Advance Directive? Yes No No No No No No  Type of Advance Directive Healthcare Power of Attorney        Would patient like information on creating a medical advance directive?  No - Patient declined No - Patient declined  No - Patient declined No - Patient declined No - Patient declined No - Patient declined    Current Medications (verified) Outpatient Encounter Medications as of 01/14/2024  Medication Sig   acetaminophen  (TYLENOL ) 500 MG tablet Take 500 mg by mouth at bedtime.   amLODipine  (NORVASC ) 5 MG tablet TAKE 1 TABLET BY MOUTH EVERY DAY   aspirin 81 MG tablet Take 81 mg by mouth every morning.   atorvastatin  (LIPITOR) 40 MG tablet Take 1 tablet (40 mg total) by mouth 2 (two) times a week. Take Mon, Thur   Bempedoic Acid-Ezetimibe  (NEXLIZET ) 180-10 MG TABS Take 1 tablet by mouth daily.   calcium  carbonate (OS-CAL) 600 MG TABS Take 600 mg by mouth every evening.   Cholecalciferol (VITAMIN D -3) 5000 UNITS TABS Take 5,000 Units by mouth every evening.   clonazePAM  (KLONOPIN ) 0.5 MG tablet Take 1 tablet (0.5 mg total) by mouth 2 (two) times daily as needed for anxiety.   escitalopram  (LEXAPRO ) 20 MG tablet TAKE 1 TABLET BY MOUTH EVERYDAY AT BEDTIME   Ferrous Sulfate (IRON ) 325 (65 Fe) MG TABS Take by mouth. Takes Monday, Wednesday, and Friday   fluticasone  (FLONASE ) 50 MCG/ACT nasal spray SPRAY 2 SPRAYS INTO EACH NOSTRIL EVERY DAY   levothyroxine  (SYNTHROID ) 88 MCG tablet Take 1 tablet (88 mcg total) by mouth daily.   Magnesium  250 MG TABS Take 250 mg by mouth daily.   Multiple Vitamins-Minerals (CENTRUM SILVER PO) Take 1 tablet by mouth every morning.   omeprazole  (PRILOSEC) 40  MG capsule TAKE 1 CAPSULE (40 MG TOTAL) BY MOUTH DAILY.   Riboflavin (B-2) 100 MG TABS Take 100 mg by mouth daily.   vitamin C (ASCORBIC ACID) 250 MG tablet Take 250 mg by mouth daily. Monday, Wednesday, and Friday   White Petrolatum (VASELINE EX) Apply 1 Application topically daily as needed (cracked skin).   No facility-administered encounter medications on file as of 01/14/2024.    Allergies (verified) Crestor [rosuvastatin], Macrodantin, Morphine, Sulfa antibiotics, and Codeine   History: Past Medical History:  Diagnosis  Date   Angina    STRESS TEST NORMAL-ANXIETY   Anxiety    Cardiac arrhythmia due to congenital heart disease    Depression    GERD (gastroesophageal reflux disease)    Headache(784.0)    OTC MEDS   Hepatitis     1976   Hyperlipidemia    Hypertension    Hypothyroidism    Iron  deficiency anemia    Melanoma (HCC)    Panic attacks    PONV (postoperative nausea and vomiting)    Past Surgical History:  Procedure Laterality Date   ABDOMINAL HYSTERECTOMY     BIOPSY  08/19/2023   Procedure: BIOPSY;  Surgeon: Vinetta Greening, DO;  [facility];  Service: Endoscopy;;   COLONOSCOPY     COLONOSCOPY WITH PROPOFOL  N/A 08/19/2023   Procedure: COLONOSCOPY WITH PROPOFOL ;  Surgeon: Vinetta Greening, DO;  [facility]: [facility];  Service: Endoscopy;  Laterality: N/A;  12:45pm, asa 2   CYSTOSCOPY  11/08/2011   Procedure: CYSTOSCOPY;  Surgeon: Madelene Schanz, MD;  Location: [facility];  Service: Gynecology;  Laterality: N/A;   DILATION AND CURETTAGE OF UTERUS     ESOPHAGOGASTRODUODENOSCOPY (EGD) WITH PROPOFOL  N/A 08/19/2023   Procedure: ESOPHAGOGASTRODUODENOSCOPY (EGD) WITH PROPOFOL ;  Surgeon: Vinetta Greening, DO;  [facility];  Service: Endoscopy;  Laterality: N/A;   LAPAROSCOPIC HYSTERECTOMY  11/08/2011   Procedure: HYSTERECTOMY TOTAL LAPAROSCOPIC;  Surgeon: Madelene Schanz, MD;  Location: [facility];  Service: Gynecology;  Laterality: N/A;  with Bilateral Salpingo-Oophorectomies   MELANOMA EXCISION     Left Shoulder   POLYPECTOMY  08/19/2023   Procedure: POLYPECTOMY;  Surgeon: Vinetta Greening, DO;  [facility]: [facility];  Service: Endoscopy;;   SVD      X 4   TUBAL LIGATION     UPPER GASTROINTESTINAL ENDOSCOPY     Family History  Problem Relation Age of Onset   Dementia Mother    Brain cancer Father    Anxiety disorder Brother    Asthma Son    Colon cancer Neg Hx    Breast cancer Neg Hx    Social History   Socioeconomic History   Marital status: Widowed     Spouse name: Dorla Gartner   Number of children: 4   Years of education: Not on file   Highest education level: Master's degree (e.g., MA, MS, MEng, MEd, MSW, MBA)  Occupational History   Occupation: paralegal    Employer: [employer]    Comment: quit 2021 - thinking of starting back part time  Tobacco Use   Smoking status: Every Day    Current packs/day: 0.25    Average packs/day: 0.3 packs/day for 30.0 years (7.5 ttl pk-yrs)    Types: Cigarettes   Smokeless tobacco: Never  Vaping Use   Vaping status: Never Used  Substance and Sexual Activity   Alcohol use: No   Drug use: No   Sexual activity: Yes  Birth control/protection: Surgical  Other Topics Concern   Not on file  Social History Narrative   Lives alone, but one son stays with her a lot - Daughter lives in [place], other children live nearby   Social Drivers of Health   Financial Resource Strain: Low Risk  (01/14/2024)   Overall Financial Resource Strain (CARDIA)    Difficulty of Paying Living Expenses: Not hard at all  Food Insecurity: No Food Insecurity (01/14/2024)   Hunger Vital Sign    Worried About Running Out of Food in the Last Year: Never true    Ran Out of Food in the Last Year: Never true  Transportation Needs: No Transportation Needs (01/14/2024)   PRAPARE - Administrator, Civil Service (Medical): No    Lack of Transportation (Non-Medical): No  Physical Activity: Unknown (01/14/2024)   Exercise Vital Sign    Days of Exercise per Week: 0 days    Minutes of Exercise per Session: Not on file  Stress: No Stress Concern Present (01/14/2024)   [employer] of Occupational Health - Occupational Stress Questionnaire    Feeling of Stress : Not at all  Social Connections: Moderately Isolated (01/14/2024)   Social Connection and Isolation Panel [NHANES]    Frequency of Communication with Friends and Family: More than three times a week    Frequency of Social Gatherings with Friends and Family: More than  three times a week    Attends Religious Services: More than 4 times per year    Active Member of [employer] or Organizations: No    Attends Banker Meetings: Never    Marital Status: Widowed    Tobacco Counseling Ready to quit: No Counseling given: Yes    Clinical Intake:  Pre-visit preparation completed: Yes  Pain : No/denies pain     BMI - recorded: 24.02 Nutritional Status: BMI of 19-24  Normal Nutritional Risks: None Diabetes: No  Lab Results  Component Value Date   HGBA1C 5.6 10/20/2023     How often do you need to have someone help you when you read instructions, pamphlets, or other written materials from your doctor or pharmacy?: 1 - Never  Interpreter Needed?: No  Information entered by :: Alia t/cma   Activities of Daily Living     01/14/2024    2:05 PM  In your present state of health, do you have any difficulty performing the following activities:  Hearing? 0  Comment pt denies hearing dif  Vision? 1  Comment pt goes to [facility] in [place] ov a yr ago/upcoming appt in 8/25  Difficulty concentrating or making decisions? 0  Walking or climbing stairs? 0  Dressing or bathing? 0  Doing errands, shopping? 0  Preparing Food and eating ? N  Using the Toilet? N  In the past six months, have you accidently leaked urine? Y  Do you have problems with loss of bowel control? Y  Managing your Medications? N  Managing your Finances? N  Housekeeping or managing your Housekeeping? Y  Comment pt's has a aide    Patient Care Team: Delfina Feller, FNP as PCP - General (Family Medicine) Jann Melody, MD as PCP - Cardiology (Cardiology) [facility][ as Diabetes Educator (Optometry)  I have updated your Care Teams any recent Medical Services you may have received from other providers in the past year.     Assessment:    This is a routine wellness examination for Aislee.  Hearing/Vision screen Hearing  Screening  - Comments:: Pt state some Vision Screening - Comments:: pt goes to [facility] in [place] ov a yr ago/upcoming appt in 8/25   Goals Addressed             This Visit's Progress    Patient Stated       Pt would like to keep her wt between 158-165lbs       Depression Screen     01/14/2024    2:20 PM 11/05/2023    1:59 PM 10/20/2023    2:23 PM 04/22/2023    2:17 PM 01/13/2023    3:36 PM 10/21/2022   12:10 PM 04/22/2022   12:31 PM  PHQ 2/9 Scores  PHQ - 2 Score 0 0 0 0 0 0 0  PHQ- 9 Score 0  2 2  0 2    Fall Risk     01/14/2024    2:15 PM 11/05/2023    1:59 PM 04/22/2023    2:17 PM 01/13/2023    3:34 PM 10/21/2022   12:10 PM  Fall Risk   Falls in the past year? 1 0 0 0 0  Number falls in past yr: 0   0 0  Injury with Fall? 0   0 0  Risk for fall due to : Impaired balance/gait;Impaired mobility   No Fall Risks   Follow up Falls evaluation completed;Education provided;Falls prevention discussed   Falls prevention discussed     MEDICARE RISK AT HOME:  Medicare Risk at Home Any stairs in or around the home?: Yes If so, are there any without handrails?: Yes Home free of loose throw rugs in walkways, pet beds, electrical cords, etc?: Yes Adequate lighting in your home to reduce risk of falls?: Yes Life alert?: No Use of a cane, walker or w/c?: Yes Grab bars in the bathroom?: No Shower chair or bench in shower?: No Elevated toilet seat or a handicapped toilet?: No  TIMED UP AND GO:  Was the test performed?  no  Cognitive Function: 6CIT completed    06/29/2018    4:07 PM  MMSE - Mini Mental State Exam  Orientation to time 5  Orientation to Place 5  Registration 3  Attention/ Calculation 5  Recall 3  Language- name 2 objects 2  Language- repeat 1  Language- follow 3 step command 3  Language- read & follow direction 1  Write a sentence 1  Copy design 1  Total score 30        01/14/2024    2:23 PM 01/13/2023    3:39 PM 01/11/2022    2:58 PM 01/10/2020     1:49 PM  6CIT Screen  What Year? 0 points 0 points 0 points 0 points  What month? 0 points 0 points 0 points 0 points  What time? 0 points 0 points 0 points 0 points  Count back from 20 0 points 0 points 0 points 0 points  Months in reverse 0 points 0 points 0 points 0 points  Repeat phrase 0 points 0 points 0 points 0 points  Total Score 0 points 0 points 0 points 0 points    Immunizations Immunization History  Administered Date(s) Administered   Fluad Quad(high Dose 65+) 04/22/2022   Fluad Trivalent(High Dose 65+) 04/22/2023   Influenza-Unspecified 07/16/2011, 06/15/2012   Moderna SARS-COV2 Booster Vaccination 07/19/2020   Moderna Sars-Covid-2 Vaccination 09/09/2019, 10/11/2019, 07/19/2020   Tdap 11/09/2013    Screening Tests Health Maintenance  Topic Date Due   Pneumonia  Vaccine 59+ Years old (1 of 2 - PCV) Never done   Zoster Vaccines- Shingrix (1 of 2) Never done   MAMMOGRAM  03/12/2022   DTaP/Tdap/Td (2 - Td or Tdap) 11/10/2023   DEXA SCAN  04/21/2024 (Originally 01/31/2023)   COVID-19 Vaccine (4 - 2024-25 season) 01/29/2025 (Originally 04/06/2023)   INFLUENZA VACCINE  03/05/2024   Medicare Annual Wellness (AWV)  01/13/2025   Hepatitis C Screening  Completed   HPV VACCINES  Aged Out   Meningococcal B Vaccine  Aged Out   Colonoscopy  Discontinued    Health Maintenance  Health Maintenance Due  Topic Date Due   Pneumonia Vaccine 13+ Years old (1 of 2 - PCV) Never done   Zoster Vaccines- Shingrix (1 of 2) Never done   MAMMOGRAM  03/12/2022   DTaP/Tdap/Td (2 - Td or Tdap) 11/10/2023   Health Maintenance Items Addressed: See Nurse Notes at the end of this note  Additional Screening:  Vision Screening: Recommended annual ophthalmology exams for early detection of glaucoma and other disorders of the eye. Would you like a referral to an eye doctor? No    Dental Screening: Recommended annual dental exams for proper oral hygiene  Community Resource Referral /  Chronic Care Management: CRR required this visit?  No   CCM required this visit?  No   Plan:    I have personally reviewed and noted the following in the patient's chart:   Medical and social history Use of alcohol, tobacco or illicit drugs  Current medications and supplements including opioid prescriptions. Patient is not currently taking opioid prescriptions. Functional ability and status Nutritional status Physical activity Advanced directives List of other physicians Hospitalizations, surgeries, and ER visits in previous 12 months Vitals Screenings to include cognitive, depression, and falls Referrals and appointments  In addition, I have reviewed and discussed with patient certain preventive protocols, quality metrics, and best practice recommendations. A written personalized care plan for preventive services as well as general preventive health recommendations were provided to patient.   Michaelle Adolphus, CMA   01/14/2024   After Visit Summary: (MyChart) Due to this being a telephonic visit, the after visit summary with patients personalized plan was offered to patient via MyChart   Notes: Pt is aware and is due the following: DTAP, Pneumonia, Shingles vaccine. Agree to get them at the next office visit. Mammogram is ordered.

## 2024-01-14 NOTE — Patient Instructions (Signed)
 Ms. Ramseyer , Thank you for taking time out of your busy schedule to complete your Annual Wellness Visit with me. I enjoyed our conversation and look forward to speaking with you again next year. I, as well as your care team,  appreciate your ongoing commitment to your health goals. Please review the following plan we discussed and let me know if I can assist you in the future. Your Game plan/ To Do List    Follow up Visits: Next Medicare AWV with our clinical staff: 01/14/24 at 10:40a.m.   Next Office Visit with your provider: 04/05/24 at 2:30p.m.  Clinician Recommendations:  Aim for 30 minutes of exercise or brisk walking, 6-8 glasses of water , and 5 servings of fruits and vegetables each day. Pt is aware and is due the following: DTAP, Pneumonia, Shingles vaccine & Mammogram. Agree to get them at the next office visit.       This is a list of the screening recommended for you and due dates:  Health Maintenance  Topic Date Due   Pneumonia Vaccine (1 of 2 - PCV) Never done   Zoster (Shingles) Vaccine (1 of 2) Never done   Mammogram  03/12/2022   DTaP/Tdap/Td vaccine (2 - Td or Tdap) 11/10/2023   Medicare Annual Wellness Visit  01/13/2024   DEXA scan (bone density measurement)  04/21/2024*   COVID-19 Vaccine (4 - 2024-25 season) 01/29/2025*   Flu Shot  03/05/2024   Hepatitis C Screening  Completed   HPV Vaccine  Aged Out   Meningitis B Vaccine  Aged Out   Colon Cancer Screening  Discontinued  *Topic was postponed. The date shown is not the original due date.    Advanced directives: (Copy Requested) Please bring a copy of your health care power of attorney and living will to the office to be added to your chart at your convenience. You can mail to [facility] [address] [place], [place] [address] or email to [email or web address] Advance Care Planning is important because it:  [x]  Makes sure you receive the medical care that is consistent with your values,  goals, and preferences  [x]  It provides guidance to your family and loved ones and reduces their decisional burden about whether or not they are making the right decisions based on your wishes.  Follow the link provided in your after visit summary or read over the paperwork we have mailed to you to help you started getting your Advance Directives in place. If you need assistance in completing these, please reach out to us  so that we can help you!  See attachments for Preventive Care and Fall Prevention Tips.

## 2024-01-21 ENCOUNTER — Encounter: Payer: Self-pay | Admitting: *Deleted

## 2024-03-31 ENCOUNTER — Other Ambulatory Visit: Payer: Self-pay | Admitting: Physician Assistant

## 2024-04-09 ENCOUNTER — Encounter: Payer: Self-pay | Admitting: Internal Medicine

## 2024-04-13 MED ORDER — NEXLIZET 180-10 MG PO TABS: 1.0000 | ORAL_TABLET | Freq: Every day | ORAL | 0 refills | Status: DC

## 2024-04-13 NOTE — Telephone Encounter (Signed)
 Called pt scheduled OV with Orren Fabry on 05/24/24 at 1:30 pm.  Advised pt will send in refill of Nexlizet  to last until OV.  Pt to request 1 yr refill at OV.  Pt reports muscle aches. Last FLP 37 10/20/23.  Spoke with MD recommends pt stop atorvastatin  40 mg twice weekly.  Pt advised and expresses understanding.

## 2024-04-19 ENCOUNTER — Ambulatory Visit (INDEPENDENT_AMBULATORY_CARE_PROVIDER_SITE_OTHER): Admitting: Nurse Practitioner

## 2024-04-19 ENCOUNTER — Encounter: Payer: Self-pay | Admitting: Nurse Practitioner

## 2024-04-19 VITALS — BP 102/66 | HR 80 | Temp 97.3°F | Ht 68.0 in | Wt 161.0 lb

## 2024-04-19 DIAGNOSIS — E039 Hypothyroidism, unspecified: Secondary | ICD-10-CM | POA: Diagnosis not present

## 2024-04-19 DIAGNOSIS — F410 Panic disorder [episodic paroxysmal anxiety] without agoraphobia: Secondary | ICD-10-CM

## 2024-04-19 DIAGNOSIS — Z23 Encounter for immunization: Secondary | ICD-10-CM | POA: Diagnosis not present

## 2024-04-19 DIAGNOSIS — E782 Mixed hyperlipidemia: Secondary | ICD-10-CM | POA: Diagnosis not present

## 2024-04-19 DIAGNOSIS — I2510 Atherosclerotic heart disease of native coronary artery without angina pectoris: Secondary | ICD-10-CM

## 2024-04-19 DIAGNOSIS — F411 Generalized anxiety disorder: Secondary | ICD-10-CM

## 2024-04-19 DIAGNOSIS — D500 Iron deficiency anemia secondary to blood loss (chronic): Secondary | ICD-10-CM | POA: Diagnosis not present

## 2024-04-19 DIAGNOSIS — I10 Essential (primary) hypertension: Secondary | ICD-10-CM

## 2024-04-19 DIAGNOSIS — Z6829 Body mass index (BMI) 29.0-29.9, adult: Secondary | ICD-10-CM

## 2024-04-19 DIAGNOSIS — K219 Gastro-esophageal reflux disease without esophagitis: Secondary | ICD-10-CM

## 2024-04-19 DIAGNOSIS — Z720 Tobacco use: Secondary | ICD-10-CM

## 2024-04-19 MED ORDER — LEVOTHYROXINE SODIUM 88 MCG PO TABS: 88.0000 ug | ORAL_TABLET | Freq: Every day | ORAL | 1 refills | Status: AC

## 2024-04-19 MED ORDER — CLONAZEPAM 0.5 MG PO TABS: 0.5000 mg | ORAL_TABLET | Freq: Two times a day (BID) | ORAL | 5 refills | Status: AC | PRN

## 2024-04-19 MED ORDER — ESCITALOPRAM OXALATE 20 MG PO TABS: ORAL_TABLET | ORAL | 1 refills | Status: AC

## 2024-04-19 MED ORDER — AMLODIPINE BESYLATE 5 MG PO TABS
ORAL_TABLET | ORAL | 1 refills | Status: DC
Start: 2024-04-19 — End: 2024-05-24

## 2024-04-19 MED ORDER — OMEPRAZOLE 40 MG PO CPDR: 40.0000 mg | DELAYED_RELEASE_CAPSULE | Freq: Every day | ORAL | 1 refills | Status: AC

## 2024-04-19 NOTE — Patient Instructions (Signed)

## 2024-04-19 NOTE — Progress Notes (Signed)
 Subjective:    Patient ID: Slater DELENA Clarke, female    DOB: 1946-06-08, 78 y.o.   MRN: [id number]   Chief Complaint: annual physical    HPI:  NEVEA SPIEWAK is a 78 y.o. who identifies as a female who was assigned female at birth.   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 Primary hypertension No c/o chest pain, sob or headache. Does not check blood pressure at home. BP Readings from Last 3 Encounters:  01/14/24 125/78  12/17/23 125/78  11/05/23 116/65     2. Coronary artery disease involving native coronary artery of native heart without angina pectoris Last saw cardiology on 03/03/23. Review of office note revealed no change in plan of care.  3. Mixed hyperlipidemia Doe snot really watch diet and does no exercise at all. Cardiologist stopped lipitor last week. Lab Results  Component Value Date   CHOL 101 10/20/2023   HDL 45 10/20/2023   LDLCALC 37 10/20/2023   TRIG 103 10/20/2023   CHOLHDL 2.2 10/20/2023     4. Gastroesophageal reflux disease without esophagitis Is on omeprazole  and is doing well.  5. Iron  deficiency anemia, unspecified iron  deficiency anemia type No c/o fatigue Lab Results  Component Value Date   HGB 13.4 12/10/2023     6. Anxiety state Is on klonopin  bid. Stays anxious    04/19/2024    2:52 PM 04/22/2023    2:18 PM 10/21/2022   12:10 PM 04/22/2022   12:31 PM  GAD 7 : Generalized Anxiety Score  Nervous, Anxious, on Edge 0 0 0 0  Control/stop worrying 0 0 0 0  Worry too much - different things 0 0 0 0  Trouble relaxing 0 0 0 0  Restless 0 0 0 0  Easily annoyed or irritable 0 0 0 0  Afraid - awful might happen 0 0 0 0  Total GAD 7 Score 0 0 0 0  Anxiety Difficulty Not difficult at all Not difficult at all Not difficult at all Not difficult at all        7. Panic attacks Is on  lexapro  and is doing well.    04/19/2024    2:52 PM 01/14/2024    2:20 PM 11/05/2023    1:59 PM  Depression screen PHQ 2/9  Decreased Interest 0 0 0  Down, Depressed, Hopeless 0 0 0  PHQ - 2 Score 0 0 0  Altered sleeping  0   Tired, decreased energy  0   Change in appetite  0   Feeling bad or failure about yourself   0   Trouble concentrating  0   Moving slowly or fidgety/restless  0   Suicidal thoughts  0   PHQ-9 Score  0   Difficult doing work/chores  Not difficult at all      8. hypothyroidism Lab Results  Component Value Date   TSH 1.890 10/20/2023     9. Tobacco abuse Smokes at least a pack a day  10. BMI 29.0-29.9,adult Weight is down 14lbs  Wt Readings from Last 3 Encounters:  04/19/24 161 lb (73 kg)  01/14/24 158 lb (71.7 kg)  12/17/23 158 lb (71.7 kg)   BMI Readings from Last 3 Encounters:  04/19/24 24.48 kg/m  01/14/24 24.02 kg/m  12/17/23 24.02 kg/m        New complaints:  None today  Allergies  Allergen Reactions   Crestor [Rosuvastatin]     Muscle aches   Macrodantin Other (See Comments)    Liver shut down   Morphine Nausea And Vomiting   Sulfa Antibiotics Other (See Comments)    agitation   Codeine Nausea And Vomiting        Outpatient Encounter Medications as of 04/19/2024  Medication Sig   acetaminophen  (TYLENOL ) 500 MG tablet Take 500 mg by mouth at bedtime.   amLODipine  (NORVASC ) 5 MG tablet TAKE 1 TABLET BY MOUTH EVERY DAY   aspirin 81 MG tablet Take 81 mg by mouth every morning.   Bempedoic Acid-Ezetimibe  (NEXLIZET ) 180-10 MG TABS Take 1 tablet by mouth daily. Keep 05/24/24 office visit for future refills   calcium  carbonate (OS-CAL) 600 MG TABS Take 600 mg by mouth every evening.   Cholecalciferol (VITAMIN D -3) 5000 UNITS TABS Take 5,000 Units by mouth every evening.   clonazePAM  (KLONOPIN ) 0.5 MG tablet Take 1 tablet (0.5 mg total) by mouth 2 (two) times daily as needed for anxiety.   escitalopram  (LEXAPRO ) 20 MG tablet  TAKE 1 TABLET BY MOUTH EVERYDAY AT BEDTIME   Ferrous Sulfate (IRON ) 325 (65 Fe) MG TABS Take by mouth. Takes Monday, Wednesday, and Friday   fluticasone  (FLONASE ) 50 MCG/ACT nasal spray SPRAY 2 SPRAYS INTO EACH NOSTRIL EVERY DAY   levothyroxine  (SYNTHROID ) 88 MCG tablet Take 1 tablet (88 mcg total) by mouth daily.   Magnesium  250 MG TABS Take 250 mg by mouth daily.   Multiple Vitamins-Minerals (CENTRUM SILVER PO) Take 1 tablet by mouth every morning.   omeprazole  (PRILOSEC) 40 MG capsule TAKE 1 CAPSULE (40 MG TOTAL) BY MOUTH DAILY.   Riboflavin (B-2) 100 MG TABS Take 100 mg by mouth daily.   vitamin C (ASCORBIC ACID) 250 MG tablet Take 250 mg by mouth daily. Monday, Wednesday, and Friday   White Petrolatum (VASELINE EX) Apply 1 Application topically daily as needed (cracked skin).   No facility-administered encounter medications on file as of 04/19/2024.    Past Surgical History:  Procedure Laterality Date   ABDOMINAL HYSTERECTOMY     BIOPSY  08/19/2023   Procedure: BIOPSY;  Surgeon: Cindie Carlin POUR, DO;  Location: [facility];  Service: Endoscopy;;   COLONOSCOPY     COLONOSCOPY WITH PROPOFOL  N/A 08/19/2023   Procedure: COLONOSCOPY WITH PROPOFOL ;  Surgeon: Cindie Carlin POUR, DO;  Location: [facility];  Service: Endoscopy;  Laterality: N/A;  12:45pm, asa 2   CYSTOSCOPY  11/08/2011   Procedure: CYSTOSCOPY;  Surgeon: Jon CINDERELLA Rummer, MD;  Location: [facility];  Service: Gynecology;  Laterality: N/A;   DILATION AND CURETTAGE OF UTERUS     ESOPHAGOGASTRODUODENOSCOPY (EGD) WITH PROPOFOL  N/A 08/19/2023   Procedure: ESOPHAGOGASTRODUODENOSCOPY (EGD) WITH PROPOFOL ;  Surgeon: Cindie Carlin POUR, DO;  Location: [facility];  Service: Endoscopy;  Laterality: N/A;   LAPAROSCOPIC HYSTERECTOMY  11/08/2011   Procedure: HYSTERECTOMY TOTAL LAPAROSCOPIC;  Surgeon: Jon CINDERELLA Rummer, MD;  Location: [facility];  Service: Gynecology;  Laterality: N/A;  with Bilateral Salpingo-Oophorectomies   MELANOMA EXCISION      Left Shoulder   POLYPECTOMY  08/19/2023   Procedure: POLYPECTOMY;  Surgeon: Cindie Carlin POUR, DO;  Location: [facility];  Service: Endoscopy;;   SVD      X 4   TUBAL LIGATION     UPPER GASTROINTESTINAL ENDOSCOPY      Family History  Problem Relation Age of Onset   Dementia Mother  Brain cancer Father    Anxiety disorder Brother    Asthma Son    Colon cancer Neg Hx    Breast cancer Neg Hx       Controlled substance contract: n/a     Review of Systems  Constitutional:  Negative for diaphoresis.  Eyes:  Negative for pain.  Respiratory:  Negative for shortness of breath.   Cardiovascular:  Negative for chest pain, palpitations and leg swelling.  Gastrointestinal:  Negative for abdominal pain.  Endocrine: Negative for polydipsia.  Skin:  Negative for rash.  Neurological:  Negative for dizziness, weakness and headaches.  Hematological:  Does not bruise/bleed easily.  All other systems reviewed and are negative.      Objective:   Physical Exam Vitals and nursing note reviewed.  Constitutional:      General: She is not in acute distress.    Appearance: Normal appearance. She is well-developed.  HENT:     Head: Normocephalic.     Right Ear: Tympanic membrane normal.     Left Ear: Tympanic membrane normal.     Nose: Nose normal.     Mouth/Throat:     Mouth: Mucous membranes are moist.  Eyes:     Pupils: Pupils are equal, round, and reactive to light.  Neck:     Vascular: No carotid bruit or JVD.  Cardiovascular:     Rate and Rhythm: Normal rate and regular rhythm.     Heart sounds: Normal heart sounds.  Pulmonary:     Effort: Pulmonary effort is normal. No respiratory distress.     Breath sounds: Normal breath sounds. No wheezing or rales.  Chest:     Chest wall: No tenderness.  Abdominal:     General: Bowel sounds are normal. There is no distension or abdominal bruit.     Palpations: Abdomen is soft. There is no hepatomegaly, splenomegaly, mass or  pulsatile mass.     Tenderness: There is no abdominal tenderness.  Musculoskeletal:        General: Normal range of motion.     Cervical back: Normal range of motion and neck supple.  Lymphadenopathy:     Cervical: No cervical adenopathy.  Skin:    General: Skin is warm and dry.  Neurological:     Mental Status: She is alert and oriented to person, place, and time.     Deep Tendon Reflexes: Reflexes are normal and symmetric.  Psychiatric:        Behavior: Behavior normal.        Thought Content: Thought content normal.        Judgment: Judgment normal.     BP 102/66   Pulse 80   Temp (!) 97.3 F (36.3 C) (Temporal)   Ht 5' 8 (1.727 m)   Wt 161 lb (73 kg)   SpO2 94%   BMI 24.48 kg/m          Assessment & Plan:   HUSNA KRONE comes in today with chief complaint of annual physical  Diagnosis and orders addressed:  1. Primary hypertension Low sodium diet - amLODipine  (NORVASC ) 5 MG tablet; TAKE 1 TABLET BY MOUTH EVERY DAY  Dispense: 90 tablet; Refill: 1  2. Coronary artery disease involving native coronary artery of native heart without angina pectoris Keep follow up with cardiology  3. Mixed hyperlipidemia Low fat diet - atorvastatin  (LIPITOR) 40 MG tablet; Take 1 tablet (40 mg total) by mouth 2 (two) times a week. Take Mon, Thur  Dispense: 90  tablet; Refill: 0  4. Gastroesophageal reflux disease without esophagitis Avoid spicy foods Do not eat 2 hours prior to bedtime  - omeprazole  (PRILOSEC) 40 MG capsule; Take 1 capsule (40 mg total) by mouth daily.  Dispense: 90 capsule; Refill: 1  5. Iron  deficiency anemia, unspecified iron  deficiency anemia type Labs pending  6. Anxiety state Stress management - ToxASSURE Select 13 (MW), Urine - clonazePAM  (KLONOPIN ) 0.5 MG tablet; Take 1 tablet (0.5 mg total) by mouth 2 (two) times daily as needed for anxiety.  Dispense: 60 tablet; Refill: 5  7. Panic attacks - escitalopram  (LEXAPRO ) 20 MG tablet; TAKE 1  TABLET BY MOUTH EVERYDAY AT BEDTIME  Dispense: 90 tablet; Refill: 1  8. Tobacco abuse Smoking cessation encouraged  9. BMI 29.0-29.9,adult Discussed diet and exercise for person with BMI >25 Will recheck weight in 3-6 months   10. Acquired hypothyroidism Labs pending - levothyroxine  (SYNTHROID ) 88 MCG tablet; Take 1 tablet (88 mcg total) by mouth daily.  Dispense: 90 tablet; Refill: 1   Labs pending Health Maintenance reviewed Diet and exercise encouraged  Follow up plan: 6 months   [name] Gladis, FNP

## 2024-04-19 NOTE — Addendum Note (Signed)
 Addended by: VIKTORIA ALAN MATSU on: 04/19/2024 04:06 PM   Modules accepted: Orders

## 2024-04-20 ENCOUNTER — Ambulatory Visit: Payer: Self-pay | Admitting: Nurse Practitioner

## 2024-04-20 LAB — CBC WITH DIFFERENTIAL/PLATELET
Basophils Absolute: 0.1 x10E3/uL (ref 0.0–0.2)
Basos: 1 %
EOS (ABSOLUTE): 0.2 x10E3/uL (ref 0.0–0.4)
Eos: 3 %
Hematocrit: 40.5 % (ref 34.0–46.6)
Hemoglobin: 13 g/dL (ref 11.1–15.9)
Immature Grans (Abs): 0 x10E3/uL (ref 0.0–0.1)
Immature Granulocytes: 0 %
Lymphocytes Absolute: 2.6 x10E3/uL (ref 0.7–3.1)
Lymphs: 33 %
MCH: 29.1 pg (ref 26.6–33.0)
MCHC: 32.1 g/dL (ref 31.5–35.7)
MCV: 91 fL (ref 79–97)
Monocytes Absolute: 0.6 x10E3/uL (ref 0.1–0.9)
Monocytes: 8 %
Neutrophils Absolute: 4.3 x10E3/uL (ref 1.4–7.0)
Neutrophils: 55 %
Platelets: 380 x10E3/uL (ref 150–450)
RBC: 4.46 x10E6/uL (ref 3.77–5.28)
RDW: 12.8 % (ref 11.7–15.4)
WBC: 7.8 x10E3/uL (ref 3.4–10.8)

## 2024-04-20 LAB — CMP14+EGFR
ALT: 13 IU/L (ref 0–32)
AST: 21 IU/L (ref 0–40)
Albumin: 4.5 g/dL (ref 3.8–4.8)
Alkaline Phosphatase: 83 IU/L (ref 49–135)
BUN/Creatinine Ratio: 17 (ref 12–28)
BUN: 18 mg/dL (ref 8–27)
Bilirubin Total: 0.3 mg/dL (ref 0.0–1.2)
CO2: 25 mmol/L (ref 20–29)
Calcium: 9.2 mg/dL (ref 8.7–10.3)
Chloride: 100 mmol/L (ref 96–106)
Creatinine, Ser: 1.05 mg/dL — ABNORMAL HIGH (ref 0.57–1.00)
Globulin, Total: 2.4 g/dL (ref 1.5–4.5)
Glucose: 99 mg/dL (ref 70–99)
Potassium: 5 mmol/L (ref 3.5–5.2)
Sodium: 137 mmol/L (ref 134–144)
Total Protein: 6.9 g/dL (ref 6.0–8.5)
eGFR: 54 mL/min/1.73 — ABNORMAL LOW (ref >59)

## 2024-04-20 LAB — LIPID PANEL
Chol/HDL Ratio: 2.2 ratio (ref 0.0–4.4)
Cholesterol, Total: 109 mg/dL (ref 100–199)
HDL: 50 mg/dL (ref >39)
LDL Chol Calc (NIH): 43 mg/dL (ref 0–99)
Triglycerides: 79 mg/dL (ref 0–149)
VLDL Cholesterol Cal: 16 mg/dL (ref 5–40)

## 2024-04-20 LAB — BAYER DCA HB A1C WAIVED: HB A1C (BAYER DCA - WAIVED): 5.5 % (ref 4.8–5.6)

## 2024-04-20 LAB — THYROID PANEL WITH TSH
Free Thyroxine Index: 2.3 (ref 1.2–4.9)
T3 Uptake Ratio: 27 % (ref 24–39)
T4, Total: 8.4 ug/dL (ref 4.5–12.0)
TSH: 1.12 u[IU]/mL (ref 0.450–4.500)

## 2024-04-22 ENCOUNTER — Ambulatory Visit: Admitting: Nurse Practitioner

## 2024-05-23 NOTE — Progress Notes (Unsigned)
 Cardiology Office Note:  .   Date:  05/24/2024  ID:  Peggy Smith, DOB 1946-06-11, MRN [id number] PCP: Gladis Mustard, FNP  [facility] Providers Cardiologist:  Stanly DELENA Leavens, MD {  History of Present Illness: .   Peggy Smith is a 78 y.o. female with HTN, HLD with myalgias on atorvastatin  80 mg, tobacco abuse who presents today for follow-up appointment.  History of cardiac CT with mild nonobstructive CAD.  Started on Zetia  10 mg given to failed statins 12/17/2021.  Patient noted she was doing okay taking Nexlizet  and paying $47 a month despite insurance.  No interval hospital/ED visits at that time.  No chest pain or pressure at follow-up visit 12/17/2021.  Was down from 7 cigarettes to 2 cigarettes a day.  No PND/orthopnea.  No weight gain or leg swelling.  No SOB or DOE.  Was able to tolerate atorvastatin  40 mg p.o. twice a week without symptoms.  I saw the patient 02/2023, she tells me that she has not had any chest pain or SOB. She had her gallbladder out earlier this year and feels much better. She needs some follow-up labs but wants to do through her PCP. We recommended she fax these to our office. Otherwise, doing well from a CV standpoint. She lost her husband recently and it has been very hard for her.  Reports no shortness of breath nor dyspnea on exertion. Reports no chest pain, pressure, or tightness. No edema, orthopnea, PND. Reports no palpitations.   Today, she presents with a history of hyperlipidemia for follow-up on her cholesterol management and general cardiology surveillance.   She manages her cholesterol with Nexlizet , taken in the morning, after discontinuing atorvastatin  due to knee pain. She is concerned about the cost of Nexlizet , which is covered by a grant until February. Her platelet count has decreased from 454 to 380 since September, with a history of elevated levels since 2018. She does have a hematologist that she sees. She  experiences shortness of breath with exertion, such as walking long distances or quickly, and avoids certain activities due to this. She has a history of bronchitis and uses an albuterol  inhaler without noticeable benefit. She smokes six to eight cigarettes daily. Her blood pressure is on the lower end of normal, and she experiences dizziness and shortness of breath with rapid movements. She takes amlodipine  5 mg for blood pressure management. She experiences episodes resembling panic attacks three to four times a year, characterized by heart pounding and a sensation of losing control. She takes Klonopin  and Lexapro  for anxiety management.  Reports no chest pain, pressure, or tightness. No edema, orthopnea, PND. Reports no palpitations.   Discussed the use of AI scribe software for clinical note transcription with the patient, who gave verbal consent to proceed.  ROS: pertinent ROS in HPI  Studies Reviewed: .        Echo Stress Testing : Date:01/08/11 Results: Negative Stress Echo for ischemia   Cardiac CT: Date: 02/19/21 Results: IMPRESSION: 1. Coronary calcium  score of 144. This was 66th percentile for age and sex matched control.   2. Normal coronary origin with right dominance.   3. Nonobstructive CAD   4. Mixed plaque in proximal LAD causes mild (25-49%) stenosis. High risk plaque features including positive remodeling, spotty calcification, and low attenuation plaque   5. Calcified plaque causes minimal (0-24%) stenosis in the proximal RCA, mid LAD, and proximal LCX   6. 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 Mild non-obstructive CAD (25-49%). Consider non-atherosclerotic causes of chest pain. Consider preventive therapy and risk factor modification.       Physical Exam:   VS:  BP 100/60 (BP Location: Right Arm, Patient Position: Sitting, Cuff Size: Normal)   Pulse 82   Ht 5' 8 (1.727 m)   Wt 162 lb (73.5 kg)   SpO2 94%   BMI 24.63 kg/m     Wt Readings from Last 3 Encounters:  05/24/24 162 lb (73.5 kg)  04/19/24 161 lb (73 kg)  01/14/24 158 lb (71.7 kg)    GEN: Well nourished, well developed in no acute distress NECK: No JVD; No carotid bruits CARDIAC: RRR, no murmurs, rubs, gallops RESPIRATORY:  Clear to auscultation without rales, wheezing or rhonchi  ABDOMEN: Soft, non-tender, non-distended EXTREMITIES:  No edema; No deformity   ASSESSMENT AND PLAN: .    Atherosclerotic heart disease of native coronary artery LDL effectively managed with Nexlizet . Previous atorvastatin  discontinued due to knee pain. Nexlizet  grant expires in February, cost concerns post-expiration. - Continue Nexlizet . - Coordinate with pharmacy to renew grant or find alternative coverage before February. -no chest pain or SOB  Essential hypertension Blood pressure at 100/60 mmHg. Shortness of breath with exertion possibly related to blood pressure or lung function. On 5 mg amlodipine . - Reduce amlodipine  to 2.5 mg daily. - Prescribe 2.5 mg amlodipine  tablets. - Encourage home blood pressure tracking, target 110-120/80 mmHg. - Ensure adequate hydration, consider increasing water  intake.  Tobacco use Smokes 6-8 cigarettes daily. Acknowledges health impact but not ready to quit due to stress of her living situation.   Dispo: She can follow-up in a year  Signed, Orren LOISE Fabry, PA-C

## 2024-05-24 ENCOUNTER — Telehealth: Payer: Self-pay | Admitting: Pharmacy Technician

## 2024-05-24 ENCOUNTER — Ambulatory Visit: Attending: Physician Assistant | Admitting: Physician Assistant

## 2024-05-24 VITALS — BP 100/60 | HR 82 | Ht 68.0 in | Wt 162.0 lb

## 2024-05-24 DIAGNOSIS — Z720 Tobacco use: Secondary | ICD-10-CM | POA: Diagnosis not present

## 2024-05-24 DIAGNOSIS — I10 Essential (primary) hypertension: Secondary | ICD-10-CM

## 2024-05-24 DIAGNOSIS — I2510 Atherosclerotic heart disease of native coronary artery without angina pectoris: Secondary | ICD-10-CM

## 2024-05-24 MED ORDER — AMLODIPINE BESYLATE 2.5 MG PO TABS
2.5000 mg | ORAL_TABLET | Freq: Every day | ORAL | 1 refills | Status: AC
Start: 2024-05-24 — End: ?

## 2024-05-24 NOTE — Telephone Encounter (Signed)
 SABRA

## 2024-05-24 NOTE — Patient Instructions (Addendum)
 Medication Instructions:  DECREASE Norvasc  (Amlodipine ) to 2.5mg  Take 1 tablet once a day  *If you need a refill on your cardiac medications before your next appointment, please call your pharmacy*  Lab Work: None Ordered If you have labs (blood work) drawn today and your tests are completely normal, you will receive your results only by: MyChart Message (if you have MyChart) OR A paper copy in the mail If you have any lab test that is abnormal or we need to change your treatment, we will call you to review the results.  Testing/Procedures: None ordered  Follow-Up: At [facility], you and your health needs are our priority.  As part of our continuing mission to provide you with exceptional heart care, our providers are all part of one team.  This team includes your primary Cardiologist (physician) and Advanced Practice Providers or APPs (Physician Assistants and Nurse Practitioners) who all work together to provide you with the care you need, when you need it.  Your next appointment:   12 month(s)  Provider:   Stanly DELENA Leavens, MD    We recommend signing up for the patient portal called MyChart.  Sign up information is provided on this After Visit Summary.  MyChart is used to connect with patients for Virtual Visits (Telemedicine).  Patients are able to view lab/test results, encounter notes, upcoming appointments, etc.  Non-urgent messages can be sent to your provider as well.   To learn more about what you can do with MyChart, go to [email or web address].   Other Instructions CONTINUE TO MONITOR YOUR BLOOD PRESSURE AND HEART RATE.

## 2024-06-15 ENCOUNTER — Inpatient Hospital Stay: Attending: Oncology

## 2024-06-15 DIAGNOSIS — D509 Iron deficiency anemia, unspecified: Secondary | ICD-10-CM | POA: Diagnosis not present

## 2024-06-15 DIAGNOSIS — Z79899 Other long term (current) drug therapy: Secondary | ICD-10-CM | POA: Insufficient documentation

## 2024-06-15 DIAGNOSIS — F17210 Nicotine dependence, cigarettes, uncomplicated: Secondary | ICD-10-CM | POA: Diagnosis not present

## 2024-06-15 DIAGNOSIS — D75839 Thrombocytosis, unspecified: Secondary | ICD-10-CM | POA: Insufficient documentation

## 2024-06-15 DIAGNOSIS — D500 Iron deficiency anemia secondary to blood loss (chronic): Secondary | ICD-10-CM

## 2024-06-15 LAB — COMPREHENSIVE METABOLIC PANEL WITH GFR
ALT: 10 U/L (ref 0–44)
AST: 22 U/L (ref 15–41)
Albumin: 4.4 g/dL (ref 3.5–5.0)
Alkaline Phosphatase: 85 U/L (ref 38–126)
Anion gap: 10 (ref 5–15)
BUN: 15 mg/dL (ref 8–23)
CO2: 30 mmol/L (ref 22–32)
Calcium: 9.3 mg/dL (ref 8.9–10.3)
Chloride: 101 mmol/L (ref 98–111)
Creatinine, Ser: 0.86 mg/dL (ref 0.44–1.00)
GFR, Estimated: >60 mL/min (ref >60)
Glucose, Bld: 96 mg/dL (ref 70–99)
Potassium: 4.2 mmol/L (ref 3.5–5.1)
Sodium: 140 mmol/L (ref 135–145)
Total Bilirubin: 0.4 mg/dL (ref 0.0–1.2)
Total Protein: 7.2 g/dL (ref 6.5–8.1)

## 2024-06-15 LAB — CBC WITH DIFFERENTIAL/PLATELET
Abs Immature Granulocytes: 0.02 K/uL (ref 0.00–0.07)
Basophils Absolute: 0.1 K/uL (ref 0.0–0.1)
Basophils Relative: 1 %
Eosinophils Absolute: 0.2 K/uL (ref 0.0–0.5)
Eosinophils Relative: 3 %
HCT: 42.3 % (ref 36.0–46.0)
Hemoglobin: 13.9 g/dL (ref 12.0–15.0)
Immature Granulocytes: 0 %
Lymphocytes Relative: 34 %
Lymphs Abs: 2.5 K/uL (ref 0.7–4.0)
MCH: 29.1 pg (ref 26.0–34.0)
MCHC: 32.9 g/dL (ref 30.0–36.0)
MCV: 88.7 fL (ref 80.0–100.0)
Monocytes Absolute: 0.6 K/uL (ref 0.1–1.0)
Monocytes Relative: 9 %
Neutro Abs: 3.8 K/uL (ref 1.7–7.7)
Neutrophils Relative %: 53 %
Platelets: 379 K/uL (ref 150–400)
RBC: 4.77 MIL/uL (ref 3.87–5.11)
RDW: 13.3 % (ref 11.5–15.5)
WBC: 7.2 K/uL (ref 4.0–10.5)
nRBC: 0 % (ref 0.0–0.2)

## 2024-06-15 LAB — IRON AND TIBC
Iron: 128 ug/dL (ref 28–170)
Saturation Ratios: 32 % — ABNORMAL HIGH (ref 10.4–31.8)
TIBC: 403 ug/dL (ref 250–450)
UIBC: 275 ug/dL

## 2024-06-15 LAB — FERRITIN: Ferritin: 32 ng/mL (ref 11–307)

## 2024-06-17 NOTE — Progress Notes (Signed)
 [facility] at [facility]  HEMATOLOGY FOLLOW-UP VISIT  Peggy Smith, Peggy Smith, Peggy Smith  REASON FOR FOLLOW-UP: Thrombocytosis  ASSESSMENT & PLAN:  Patient is a 78 y.o.  female with iron  deficiency anemia   IDA (iron  deficiency anemia) Acute GI bleeding Repeat colonoscopy and endoscopy showed gastritis and colitis.  No active bleeding. Iron  labs improved with iron  supplementation   -Labs reviewed today: CMP: Normal, TSAT: 32, ferritin: 32, iron : 128, TIBC: 403, CBC: Normal - Continue oral iron  every other day.   Thrombocytosis Likely secondary to iron  deficiency.  Negative JAK2 testing. Resolved now   Tobacco use 30-year history of smoking, currently 5-9 cigarettes daily.   - Patient is not ready for quitting yet - Advised tobacco cessation  Iron  deficiency resolved at this time.  Patient does not have any other hematological needs at this time.  Will discharge patient from clinic.  Recommended patient to reach out to us  with future questions or concerns.   The total time spent in the appointment was 20 minutes encounter with patients including review of chart and various tests results, discussions about plan of care and coordination of care plan   All questions were answered. The patient knows to call the clinic with any problems, questions or concerns. No barriers to learning was detected.  Mickiel Dry, MD 11/14/20251:50 PM    INTERVAL HISTORY: Discussed the use of AI scribe software for clinical note transcription with the patient, who gave verbal consent to proceed.  History of Present Illness Peggy Smith is a 78 year old female who presents with iron  deficiency for follow-up.  She experiences shortness of breath, particularly when walking longer distances such as to the mailbox or through [place], although she can manage shorter trips like going through the grocery store.  She is currently taking oral iron  pills on a schedule of  Monday, Wednesday, and Friday, which are not causing any issues. Her lab results have been mostly normal.  She expresses dissatisfaction with her blood pressure medication being cut in half, noting that her blood pressure readings are inconsistent throughout the day. Her blood pressure is stable when taken right before bed, but not at other times during the day.  She is going to talk about this to her primary care.  Denies any bleeding or melena.  I have reviewed the past medical history, past surgical history, social history and family history with the patient   ALLERGIES:  is allergic to crestor [rosuvastatin], macrodantin, morphine, sulfa antibiotics, and codeine.  MEDICATIONS:  Current Outpatient Medications  Medication Sig Dispense Refill   acetaminophen  (TYLENOL ) 500 MG tablet Take 500 mg by mouth at bedtime.     amLODipine  (NORVASC ) 2.5 MG tablet Take 1 tablet (2.5 mg total) by mouth daily. 90 tablet 1   aspirin 81 MG tablet Take 81 mg by mouth every morning.     Bempedoic Acid-Ezetimibe  (NEXLIZET ) 180-10 MG TABS Take 1 tablet by mouth daily. Keep 05/24/24 office visit for future refills 90 tablet 0   calcium  carbonate (OS-CAL) 600 MG TABS Take 600 mg by mouth every evening.     Cholecalciferol (VITAMIN D -3) 5000 UNITS TABS Take 5,000 Units by mouth every evening.     clonazePAM  (KLONOPIN ) 0.5 MG tablet Take 1 tablet (0.5 mg total) by mouth 2 (two) times daily as needed for anxiety. 60 tablet 5   escitalopram  (LEXAPRO ) 20 MG tablet TAKE 1 TABLET BY MOUTH EVERYDAY AT BEDTIME 90 tablet 1   Ferrous Sulfate (IRON ) 325 (  65 Fe) MG TABS Take by mouth. Takes Monday, Wednesday, and Friday     fluticasone  (FLONASE ) 50 MCG/ACT nasal spray SPRAY 2 SPRAYS INTO EACH NOSTRIL EVERY DAY 48 mL 1   levothyroxine  (SYNTHROID ) 88 MCG tablet Take 1 tablet (88 mcg total) by mouth daily. 90 tablet 1   Magnesium  250 MG TABS Take 250 mg by mouth daily.     Multiple Vitamins-Minerals (CENTRUM SILVER PO) Take 1  tablet by mouth every morning.     omeprazole  (PRILOSEC) 40 MG capsule Take 1 capsule (40 mg total) by mouth daily. 90 capsule 1   Riboflavin (B-2) 100 MG TABS Take 100 mg by mouth daily.     vitamin C (ASCORBIC ACID) 250 MG tablet Take 250 mg by mouth daily. Monday, Wednesday, and Friday     White Petrolatum (VASELINE EX) Apply 1 Application topically daily as needed (cracked skin).     No current facility-administered medications for this visit.     REVIEW OF SYSTEMS:   Constitutional: Denies fevers, chills or night sweats Eyes: Denies blurriness of vision Ears, nose, mouth, throat, and face: Denies mucositis or sore throat Respiratory: Denies cough, dyspnea or wheezes Cardiovascular: Denies palpitation, chest discomfort or lower extremity swelling Gastrointestinal:  Denies nausea, heartburn or change in bowel habits Skin: Denies abnormal skin rashes Lymphatics: Denies new lymphadenopathy or easy bruising Neurological:Denies numbness, tingling or new weaknesses Behavioral/Psych: Mood is stable, no new changes  All other systems were reviewed with the patient and are negative.  PHYSICAL EXAMINATION:   Vitals:   06/18/24 1308 06/18/24 1313  BP: (!) 149/90 (!) 141/75  Pulse: 82   Resp: 20   Temp: 97.7 F (36.5 C)   SpO2: 96%     GENERAL:alert, no distress and comfortable SKIN: skin color, texture, turgor are normal, no rashes or significant lesions LUNGS: clear to auscultation and percussion with normal breathing effort HEART: regular rate & rhythm and no murmurs and no lower extremity edema ABDOMEN:abdomen soft, non-tender and normal bowel sounds Musculoskeletal:no cyanosis of digits and no clubbing  NEURO: alert & oriented x 3 with fluent speech  LABORATORY DATA:  I have reviewed the data as listed  Lab Results  Component Value Date   WBC 7.2 06/15/2024   NEUTROABS 3.8 06/15/2024   HGB 13.9 06/15/2024   HCT 42.3 06/15/2024   MCV 88.7 06/15/2024   PLT 379  06/15/2024       Chemistry      Component Value Date/Time   NA 140 06/15/2024 1216   NA 137 04/19/2024 1527   K 4.2 06/15/2024 1216   CL 101 06/15/2024 1216   CO2 30 06/15/2024 1216   BUN 15 06/15/2024 1216   BUN 18 04/19/2024 1527   CREATININE 0.86 06/15/2024 1216   CREATININE 0.98 10/16/2012 1720      Component Value Date/Time   CALCIUM  9.3 06/15/2024 1216   ALKPHOS 85 06/15/2024 1216   AST 22 06/15/2024 1216   ALT 10 06/15/2024 1216   BILITOT 0.4 06/15/2024 1216   BILITOT 0.3 04/19/2024 1527      Latest Reference Range & Units 06/15/24 12:16  Iron  28 - 170 ug/dL 871  UIBC ug/dL 724  TIBC 749 - 549 ug/dL 596  Saturation Ratios 10.4 - 31.8 % 32 (H)  Ferritin 11 - 307 ng/mL 32  (H): Data is abnormally high  Procedures: Colonoscopy: 08/19/23: Impression:  - Non- bleeding internal hemorrhoids.  - Diverticulosis in the sigmoid colon and in the descending colon.  -  One 6 mm polyp in the transverse colon, removed with a cold snare. Resected and retrieved.  - One 5 mm polyp in the sigmoid colon, removed with a cold snare. Resected and retrieved. - Localized moderate inflammation was found in the sigmoid colon. Biopsied.  Endoscopy: 08/19/23: Impression:  - Normal esophagus.  - Z- line regular, 41 cm from the incisors.  - Gastritis. Biopsied.  - Normal duodenal bulb, first portion of the duodenum and second portion of the duodenum.  Pathology: FINAL MICROSCOPIC DIAGNOSIS:   A. 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       No H. pylori identified on HE stain.       Negative for intestinal metaplasia or dysplasia.   B. COLON, TRANSVERSE, POLYPECTOMY:       Tubular adenoma.       Negative for high-grade dysplasia.   C. COLON, SIGMOID INFLAMMATION, BIOPSY:       Active colitis.       Negative for chronicity, granuloma, dysplasia or malignancy.   D. COLON, SIGMOID, POLYPECTOMY:       Tubular adenoma.       Negative  for high-grade dysplasia.

## 2024-06-18 ENCOUNTER — Inpatient Hospital Stay: Admitting: Oncology

## 2024-06-18 VITALS — BP 141/75 | HR 82 | Temp 97.7°F | Resp 20 | Ht 67.72 in | Wt 160.3 lb

## 2024-06-18 DIAGNOSIS — Z79899 Other long term (current) drug therapy: Secondary | ICD-10-CM | POA: Diagnosis not present

## 2024-06-18 DIAGNOSIS — Z720 Tobacco use: Secondary | ICD-10-CM

## 2024-06-18 DIAGNOSIS — F17210 Nicotine dependence, cigarettes, uncomplicated: Secondary | ICD-10-CM | POA: Diagnosis not present

## 2024-06-18 DIAGNOSIS — D75839 Thrombocytosis, unspecified: Secondary | ICD-10-CM | POA: Diagnosis not present

## 2024-06-18 DIAGNOSIS — D509 Iron deficiency anemia, unspecified: Secondary | ICD-10-CM

## 2024-06-25 ENCOUNTER — Other Ambulatory Visit: Payer: Self-pay | Admitting: Nurse Practitioner

## 2024-06-25 DIAGNOSIS — J301 Allergic rhinitis due to pollen: Secondary | ICD-10-CM

## 2024-06-28 ENCOUNTER — Other Ambulatory Visit: Payer: Self-pay | Admitting: Internal Medicine

## 2024-08-02 ENCOUNTER — Encounter: Payer: Self-pay | Admitting: *Deleted

## 2024-08-23 NOTE — Telephone Encounter (Signed)
 Patient Advocate Encounter   The patient was approved for a [employer] grant that will help cover the cost of nexlizet  Total amount awarded, 2500.  Effective: 09/23/24 - 09/22/25   APW:[id number] ERW:[id number] Hmnle:[id number] PI:[id number] [employer] ID: [id number]   Pharmacy provided with approval and processing information. Patient informed via mychart

## 2024-10-14 ENCOUNTER — Ambulatory Visit: Admitting: Nurse Practitioner

## 2024-10-18 ENCOUNTER — Ambulatory Visit: Payer: Self-pay | Admitting: Nurse Practitioner

## 2025-01-14 ENCOUNTER — Ambulatory Visit: Payer: Self-pay

## 2025-01-25 ENCOUNTER — Ambulatory Visit
